# Patient Record
Sex: Female | Born: 1945
Health system: Southern US, Community
[De-identification: ages and names within clinical notes are randomized; demographics above are authoritative.]

## PROBLEM LIST (undated history)

## (undated) DIAGNOSIS — I350 Nonrheumatic aortic (valve) stenosis: Secondary | ICD-10-CM

## (undated) DIAGNOSIS — Z860100 Personal history of colon polyps, unspecified: Secondary | ICD-10-CM

## (undated) DIAGNOSIS — R011 Cardiac murmur, unspecified: Secondary | ICD-10-CM

## (undated) DIAGNOSIS — M1990 Unspecified osteoarthritis, unspecified site: Secondary | ICD-10-CM

## (undated) DIAGNOSIS — F419 Anxiety disorder, unspecified: Secondary | ICD-10-CM

## (undated) DIAGNOSIS — E7800 Pure hypercholesterolemia, unspecified: Secondary | ICD-10-CM

## (undated) DIAGNOSIS — E041 Nontoxic single thyroid nodule: Secondary | ICD-10-CM

## (undated) DIAGNOSIS — I10 Essential (primary) hypertension: Secondary | ICD-10-CM

## (undated) HISTORY — DX: Personal history of colon polyps, unspecified: Z86.0100

## (undated) HISTORY — PX: COLONOSCOPY: SHX174

## (undated) HISTORY — DX: Essential (primary) hypertension: I10

## (undated) HISTORY — DX: Personal history of colonic polyps: Z86.010

## (undated) HISTORY — PX: POLYPECTOMY: SHX149

---

## 1978-03-17 HISTORY — PX: PARTIAL HYSTERECTOMY: SHX80

## 2008-05-18 ENCOUNTER — Encounter (INDEPENDENT_AMBULATORY_CARE_PROVIDER_SITE_OTHER): Payer: Self-pay | Admitting: *Deleted

## 2008-09-25 ENCOUNTER — Ambulatory Visit: Payer: Self-pay | Admitting: Internal Medicine

## 2008-10-12 ENCOUNTER — Encounter: Payer: Self-pay | Admitting: Internal Medicine

## 2008-10-12 ENCOUNTER — Ambulatory Visit: Payer: Self-pay | Admitting: Internal Medicine

## 2008-10-14 ENCOUNTER — Encounter: Payer: Self-pay | Admitting: Internal Medicine

## 2013-11-03 ENCOUNTER — Encounter: Payer: Self-pay | Admitting: *Deleted

## 2013-11-08 ENCOUNTER — Encounter: Payer: Self-pay | Admitting: Internal Medicine

## 2013-11-18 ENCOUNTER — Encounter: Payer: Self-pay | Admitting: Internal Medicine

## 2014-01-05 ENCOUNTER — Ambulatory Visit (AMBULATORY_SURGERY_CENTER): Payer: Commercial Managed Care - HMO | Admitting: *Deleted

## 2014-01-05 VITALS — Ht 64.5 in | Wt 167.2 lb

## 2014-01-05 DIAGNOSIS — Z86010 Personal history of colonic polyps: Secondary | ICD-10-CM

## 2014-01-05 MED ORDER — MOVIPREP 100 G PO SOLR: 1.0000 | Freq: Once | ORAL | Status: DC

## 2014-01-05 NOTE — Progress Notes (Signed)
No egg or soy allergy. ewm No home 02 use. ewm No diet pills. ewm No issues with past sedation. ewm Pt declined emmi. ewm

## 2014-01-18 ENCOUNTER — Ambulatory Visit (AMBULATORY_SURGERY_CENTER): Payer: Commercial Managed Care - HMO | Admitting: Internal Medicine

## 2014-01-18 ENCOUNTER — Encounter: Payer: Self-pay | Admitting: Internal Medicine

## 2014-01-18 VITALS — BP 119/61 | HR 55 | Temp 98.0°F | Resp 42 | Ht 64.0 in | Wt 167.0 lb

## 2014-01-18 DIAGNOSIS — D122 Benign neoplasm of ascending colon: Secondary | ICD-10-CM

## 2014-01-18 DIAGNOSIS — Z86010 Personal history of colonic polyps: Secondary | ICD-10-CM

## 2014-01-18 MED ORDER — SODIUM CHLORIDE 0.9 % IV SOLN
500.0000 mL | INTRAVENOUS | Status: DC
Start: 2014-01-18 — End: 2014-01-18

## 2014-01-18 NOTE — Op Note (Signed)
[place]  [address] [place], [id number]   COLONOSCOPY PROCEDURE REPORT  PATIENT: Chicquita, Mendel  MR#: [id number] BIRTHDATE: Jan 26, 1946 , 68  yrs. old GENDER: female ENDOSCOPIST: Eustace Quail, MD REFERRED SL:[id number] Program Recall PROCEDURE DATE:  01/18/2014 PROCEDURE:   Colonoscopy with snare polypectomy x 2 First Screening Colonoscopy - Avg.  risk and is 50 yrs.  old or older - No.  Prior Negative Screening - Now for repeat screening. N/A  History of Adenoma - Now for follow-up colonoscopy & has been > or = to 3 yrs.  Yes hx of adenoma.  Has been 3 or more years since last colonoscopy.  Polyps Removed Today? Yes. ASA CLASS:   Class II INDICATIONS:surveillance colonoscopy based on a history of adenomatous colonic polyp(s). Index 2005 (-); 2010 (TA's x 3) MEDICATIONS: Monitored anesthesia care and Propofol 300 mg IV  DESCRIPTION OF PROCEDURE:   After the risks benefits and alternatives of the procedure were thoroughly explained, informed consent was obtained.  The digital rectal exam revealed no abnormalities of the rectum.   The LB [id number]  endoscope was introduced through the anus and advanced to the cecum, which was identified by both the appendix and ileocecal valve. No adverse events experienced.   The quality of the prep was excellent, using MoviPrep  The instrument was then slowly withdrawn as the colon was fully examined.    COLON FINDINGS: Two sessile polyps ranging from 3 to 3mm in size were found in the ascending colon.  A polypectomy was performed with a cold snare.  The resection was complete, the polyp tissue was completely retrieved and sent to histology.   The examination was otherwise normal.  Retroflexed views revealed internal hemorrhoids. The time to cecum=1 minutes 43 seconds.  Withdrawal time=10 minutes 53 seconds.  The scope was withdrawn and the procedure completed. COMPLICATIONS: There were no immediate  complications.  ENDOSCOPIC IMPRESSION: 1.   Two sessile polyps were found in the ascending colon; polypectomy was performed with a cold snare 2.   The examination was otherwise normal  RECOMMENDATIONS: 1. Follow up colonoscopy in 5 years  eSigned:  Eustace Quail, MD 01/18/2014 11:49 AM   cc: [name], MD and The Patient

## 2014-01-18 NOTE — Progress Notes (Signed)
Called to room to assist during endoscopic procedure.  Patient ID and intended procedure confirmed with present staff. Received instructions for my participation in the procedure from the performing physician.  

## 2014-01-18 NOTE — Progress Notes (Signed)
Patient awakening,vss,report to rn 

## 2014-01-18 NOTE — Patient Instructions (Signed)
YOU HAD AN ENDOSCOPIC PROCEDURE TODAY AT THE [facility]: Refer to the procedure report that was given to you for any specific questions about what was found during the examination.  If the procedure report does not answer your questions, please call your gastroenterologist to clarify.  If you requested that your care partner not be given the details of your procedure findings, then the procedure report has been included in a sealed envelope for you to review at your convenience later.  YOU SHOULD EXPECT: Some feelings of bloating in the abdomen. Passage of more gas than usual.  Walking can help get rid of the air that was put into your GI tract during the procedure and reduce the bloating. If you had a lower endoscopy (such as a colonoscopy or flexible sigmoidoscopy) you may notice spotting of blood in your stool or on the toilet paper. If you underwent a bowel prep for your procedure, then you may not have a normal bowel movement for a few days.  DIET: Your first meal following the procedure should be a light meal and then it is ok to progress to your normal diet.  A half-sandwich or bowl of soup is an example of a good first meal.  Heavy or fried foods are harder to digest and may make you feel nauseous or bloated.  Likewise meals heavy in dairy and vegetables can cause extra gas to form and this can also increase the bloating.  Drink plenty of fluids but you should avoid alcoholic beverages for 24 hours.  ACTIVITY: Your care partner should take you home directly after the procedure.  You should plan to take it easy, moving slowly for the rest of the day.  You can resume normal activity the day after the procedure however you should NOT DRIVE or use heavy machinery for 24 hours (because of the sedation medicines used during the test).    SYMPTOMS TO REPORT IMMEDIATELY: A gastroenterologist can be reached at any hour.  During normal business hours, 8:30 AM to 5:00 PM Monday through Friday,  call [phone].  After hours and on weekends, please call the GI answering service at [phone] who will take a message and have the physician on call contact you.   Following lower endoscopy (colonoscopy or flexible sigmoidoscopy):  Excessive amounts of blood in the stool  Significant tenderness or worsening of abdominal pains  Swelling of the abdomen that is new, acute  Fever of 100F or higher  FOLLOW UP: If any biopsies were taken you will be contacted by phone or by letter within the next 1-3 weeks.  Call your gastroenterologist if you have not heard about the biopsies in 3 weeks.  Our staff will call the home number listed on your records the next business day following your procedure to check on you and address any questions or concerns that you may have at that time regarding the information given to you following your procedure. This is a courtesy call and so if there is no answer at the home number and we have not heard from you through the emergency physician on call, we will assume that you have returned to your regular daily activities without incident.  SIGNATURES/CONFIDENTIALITY: You and/or your care partner have signed paperwork which will be entered into your electronic medical record.  These signatures attest to the fact that that the information above on your After Visit Summary has been reviewed and is understood.  Full responsibility of the confidentiality of this   discharge information lies with you and/or your care-partner.    Resume medications. Information given on polyps with discharge instructions. 

## 2014-01-19 ENCOUNTER — Telehealth: Payer: Self-pay | Admitting: *Deleted

## 2014-01-19 NOTE — Telephone Encounter (Signed)
  Follow up Call-  Call back number 01/18/2014  Post procedure Call Back phone  # [phone]  Permission to leave phone message Yes     Patient questions:  Do you have a fever, pain , or abdominal swelling? No. Pain Score  0 *  Have you tolerated food without any problems? Yes.    Have you been able to return to your normal activities? Yes.    Do you have any questions about your discharge instructions: Diet   No. Medications  No. Follow up visit  No.  Do you have questions or concerns about your Care? No.  Actions: * If pain score is 4 or above: No action needed, pain <4.

## 2014-01-24 ENCOUNTER — Encounter: Payer: Self-pay | Admitting: Internal Medicine

## 2014-05-12 DIAGNOSIS — Z79899 Other long term (current) drug therapy: Secondary | ICD-10-CM | POA: Diagnosis not present

## 2014-05-12 DIAGNOSIS — R8290 Unspecified abnormal findings in urine: Secondary | ICD-10-CM | POA: Diagnosis not present

## 2014-05-12 DIAGNOSIS — M859 Disorder of bone density and structure, unspecified: Secondary | ICD-10-CM | POA: Diagnosis not present

## 2014-05-12 DIAGNOSIS — R8299 Other abnormal findings in urine: Secondary | ICD-10-CM | POA: Diagnosis not present

## 2014-05-12 DIAGNOSIS — I10 Essential (primary) hypertension: Secondary | ICD-10-CM | POA: Diagnosis not present

## 2014-05-19 DIAGNOSIS — Z1389 Encounter for screening for other disorder: Secondary | ICD-10-CM | POA: Diagnosis not present

## 2014-05-19 DIAGNOSIS — Z0000 Encounter for general adult medical examination without abnormal findings: Secondary | ICD-10-CM | POA: Diagnosis not present

## 2014-05-19 DIAGNOSIS — K219 Gastro-esophageal reflux disease without esophagitis: Secondary | ICD-10-CM | POA: Diagnosis not present

## 2014-05-19 DIAGNOSIS — I10 Essential (primary) hypertension: Secondary | ICD-10-CM | POA: Diagnosis not present

## 2014-05-19 DIAGNOSIS — M859 Disorder of bone density and structure, unspecified: Secondary | ICD-10-CM | POA: Diagnosis not present

## 2014-05-19 DIAGNOSIS — Z6827 Body mass index (BMI) 27.0-27.9, adult: Secondary | ICD-10-CM | POA: Diagnosis not present

## 2014-05-25 DIAGNOSIS — Z1212 Encounter for screening for malignant neoplasm of rectum: Secondary | ICD-10-CM | POA: Diagnosis not present

## 2014-06-01 DIAGNOSIS — Z1231 Encounter for screening mammogram for malignant neoplasm of breast: Secondary | ICD-10-CM | POA: Diagnosis not present

## 2014-06-26 DIAGNOSIS — H5210 Myopia, unspecified eye: Secondary | ICD-10-CM | POA: Diagnosis not present

## 2014-06-26 DIAGNOSIS — H5203 Hypermetropia, bilateral: Secondary | ICD-10-CM | POA: Diagnosis not present

## 2014-10-16 DIAGNOSIS — L988 Other specified disorders of the skin and subcutaneous tissue: Secondary | ICD-10-CM | POA: Diagnosis not present

## 2014-10-16 DIAGNOSIS — Z6827 Body mass index (BMI) 27.0-27.9, adult: Secondary | ICD-10-CM | POA: Diagnosis not present

## 2014-11-15 DIAGNOSIS — I10 Essential (primary) hypertension: Secondary | ICD-10-CM | POA: Diagnosis not present

## 2014-11-15 DIAGNOSIS — M859 Disorder of bone density and structure, unspecified: Secondary | ICD-10-CM | POA: Diagnosis not present

## 2014-11-15 DIAGNOSIS — Z6828 Body mass index (BMI) 28.0-28.9, adult: Secondary | ICD-10-CM | POA: Diagnosis not present

## 2014-11-15 DIAGNOSIS — F419 Anxiety disorder, unspecified: Secondary | ICD-10-CM | POA: Diagnosis not present

## 2014-11-15 DIAGNOSIS — K219 Gastro-esophageal reflux disease without esophagitis: Secondary | ICD-10-CM | POA: Diagnosis not present

## 2014-12-08 DIAGNOSIS — R229 Localized swelling, mass and lump, unspecified: Secondary | ICD-10-CM | POA: Diagnosis not present

## 2014-12-08 DIAGNOSIS — Z889 Allergy status to unspecified drugs, medicaments and biological substances status: Secondary | ICD-10-CM | POA: Diagnosis not present

## 2014-12-08 DIAGNOSIS — Z6827 Body mass index (BMI) 27.0-27.9, adult: Secondary | ICD-10-CM | POA: Diagnosis not present

## 2015-01-04 DIAGNOSIS — J310 Chronic rhinitis: Secondary | ICD-10-CM | POA: Diagnosis not present

## 2015-01-04 DIAGNOSIS — H1045 Other chronic allergic conjunctivitis: Secondary | ICD-10-CM | POA: Diagnosis not present

## 2015-01-04 DIAGNOSIS — K219 Gastro-esophageal reflux disease without esophagitis: Secondary | ICD-10-CM | POA: Diagnosis not present

## 2015-01-04 DIAGNOSIS — R220 Localized swelling, mass and lump, head: Secondary | ICD-10-CM | POA: Diagnosis not present

## 2015-04-23 DIAGNOSIS — H1045 Other chronic allergic conjunctivitis: Secondary | ICD-10-CM | POA: Diagnosis not present

## 2015-04-23 DIAGNOSIS — J310 Chronic rhinitis: Secondary | ICD-10-CM | POA: Diagnosis not present

## 2015-04-23 DIAGNOSIS — R220 Localized swelling, mass and lump, head: Secondary | ICD-10-CM | POA: Diagnosis not present

## 2015-04-23 DIAGNOSIS — K219 Gastro-esophageal reflux disease without esophagitis: Secondary | ICD-10-CM | POA: Diagnosis not present

## 2015-05-21 DIAGNOSIS — N390 Urinary tract infection, site not specified: Secondary | ICD-10-CM | POA: Diagnosis not present

## 2015-05-21 DIAGNOSIS — I10 Essential (primary) hypertension: Secondary | ICD-10-CM | POA: Diagnosis not present

## 2015-05-21 DIAGNOSIS — M859 Disorder of bone density and structure, unspecified: Secondary | ICD-10-CM | POA: Diagnosis not present

## 2015-05-21 DIAGNOSIS — R8299 Other abnormal findings in urine: Secondary | ICD-10-CM | POA: Diagnosis not present

## 2015-05-28 DIAGNOSIS — Z1389 Encounter for screening for other disorder: Secondary | ICD-10-CM | POA: Diagnosis not present

## 2015-05-28 DIAGNOSIS — K219 Gastro-esophageal reflux disease without esophagitis: Secondary | ICD-10-CM | POA: Diagnosis not present

## 2015-05-28 DIAGNOSIS — Z6827 Body mass index (BMI) 27.0-27.9, adult: Secondary | ICD-10-CM | POA: Diagnosis not present

## 2015-05-28 DIAGNOSIS — Z0000 Encounter for general adult medical examination without abnormal findings: Secondary | ICD-10-CM | POA: Diagnosis not present

## 2015-05-28 DIAGNOSIS — Z124 Encounter for screening for malignant neoplasm of cervix: Secondary | ICD-10-CM | POA: Diagnosis not present

## 2015-05-28 DIAGNOSIS — J302 Other seasonal allergic rhinitis: Secondary | ICD-10-CM | POA: Diagnosis not present

## 2015-05-28 DIAGNOSIS — I10 Essential (primary) hypertension: Secondary | ICD-10-CM | POA: Diagnosis not present

## 2015-05-28 DIAGNOSIS — F419 Anxiety disorder, unspecified: Secondary | ICD-10-CM | POA: Diagnosis not present

## 2015-05-28 DIAGNOSIS — M859 Disorder of bone density and structure, unspecified: Secondary | ICD-10-CM | POA: Diagnosis not present

## 2015-05-28 DIAGNOSIS — E663 Overweight: Secondary | ICD-10-CM | POA: Diagnosis not present

## 2015-06-04 DIAGNOSIS — Z1231 Encounter for screening mammogram for malignant neoplasm of breast: Secondary | ICD-10-CM | POA: Diagnosis not present

## 2015-06-08 DIAGNOSIS — Z1212 Encounter for screening for malignant neoplasm of rectum: Secondary | ICD-10-CM | POA: Diagnosis not present

## 2015-06-22 ENCOUNTER — Encounter: Payer: Self-pay | Admitting: Internal Medicine

## 2015-08-26 DIAGNOSIS — H6123 Impacted cerumen, bilateral: Secondary | ICD-10-CM | POA: Diagnosis not present

## 2015-09-14 DIAGNOSIS — H5210 Myopia, unspecified eye: Secondary | ICD-10-CM | POA: Diagnosis not present

## 2015-09-14 DIAGNOSIS — H524 Presbyopia: Secondary | ICD-10-CM | POA: Diagnosis not present

## 2015-12-12 DIAGNOSIS — J302 Other seasonal allergic rhinitis: Secondary | ICD-10-CM | POA: Diagnosis not present

## 2015-12-12 DIAGNOSIS — K219 Gastro-esophageal reflux disease without esophagitis: Secondary | ICD-10-CM | POA: Diagnosis not present

## 2015-12-12 DIAGNOSIS — E663 Overweight: Secondary | ICD-10-CM | POA: Diagnosis not present

## 2015-12-12 DIAGNOSIS — F419 Anxiety disorder, unspecified: Secondary | ICD-10-CM | POA: Diagnosis not present

## 2015-12-12 DIAGNOSIS — I10 Essential (primary) hypertension: Secondary | ICD-10-CM | POA: Diagnosis not present

## 2015-12-12 DIAGNOSIS — M859 Disorder of bone density and structure, unspecified: Secondary | ICD-10-CM | POA: Diagnosis not present

## 2015-12-12 DIAGNOSIS — Z6827 Body mass index (BMI) 27.0-27.9, adult: Secondary | ICD-10-CM | POA: Diagnosis not present

## 2016-12-01 ENCOUNTER — Other Ambulatory Visit: Payer: Self-pay | Admitting: Internal Medicine

## 2016-12-01 DIAGNOSIS — I10 Essential (primary) hypertension: Secondary | ICD-10-CM

## 2016-12-01 DIAGNOSIS — R011 Cardiac murmur, unspecified: Secondary | ICD-10-CM

## 2016-12-02 ENCOUNTER — Ambulatory Visit (HOSPITAL_COMMUNITY): Payer: Medicare PPO | Attending: Cardiology

## 2016-12-02 ENCOUNTER — Other Ambulatory Visit: Payer: Self-pay

## 2016-12-02 ENCOUNTER — Encounter (INDEPENDENT_AMBULATORY_CARE_PROVIDER_SITE_OTHER): Payer: Self-pay

## 2016-12-02 DIAGNOSIS — I340 Nonrheumatic mitral (valve) insufficiency: Secondary | ICD-10-CM | POA: Diagnosis not present

## 2016-12-02 DIAGNOSIS — R011 Cardiac murmur, unspecified: Secondary | ICD-10-CM | POA: Insufficient documentation

## 2016-12-02 DIAGNOSIS — E785 Hyperlipidemia, unspecified: Secondary | ICD-10-CM | POA: Diagnosis not present

## 2016-12-02 DIAGNOSIS — I10 Essential (primary) hypertension: Secondary | ICD-10-CM | POA: Insufficient documentation

## 2016-12-04 ENCOUNTER — Telehealth: Payer: Self-pay | Admitting: Cardiovascular Disease

## 2016-12-04 NOTE — Telephone Encounter (Signed)
Received incoming records from [facility] for upcoming appointment on 12/30/16 @ 2:40pm with Dr. [name]. Records given to [facility] in Medical Records. 12/04/16 ab

## 2016-12-30 ENCOUNTER — Encounter: Payer: Self-pay | Admitting: Cardiovascular Disease

## 2016-12-30 ENCOUNTER — Ambulatory Visit (INDEPENDENT_AMBULATORY_CARE_PROVIDER_SITE_OTHER): Payer: Medicare PPO | Admitting: Cardiovascular Disease

## 2016-12-30 VITALS — BP 158/98 | HR 68 | Ht 64.5 in | Wt 165.4 lb

## 2016-12-30 DIAGNOSIS — E785 Hyperlipidemia, unspecified: Secondary | ICD-10-CM

## 2016-12-30 DIAGNOSIS — I519 Heart disease, unspecified: Secondary | ICD-10-CM

## 2016-12-30 DIAGNOSIS — I350 Nonrheumatic aortic (valve) stenosis: Secondary | ICD-10-CM | POA: Diagnosis not present

## 2016-12-30 DIAGNOSIS — I77810 Thoracic aortic ectasia: Secondary | ICD-10-CM | POA: Diagnosis not present

## 2016-12-30 DIAGNOSIS — I10 Essential (primary) hypertension: Secondary | ICD-10-CM | POA: Diagnosis not present

## 2016-12-30 DIAGNOSIS — I5189 Other ill-defined heart diseases: Secondary | ICD-10-CM

## 2016-12-30 MED ORDER — LOSARTAN POTASSIUM 25 MG PO TABS: 25.0000 mg | ORAL_TABLET | Freq: Every day | ORAL | 3 refills | Status: DC

## 2016-12-30 NOTE — Patient Instructions (Signed)
Medication Instructions:  START losartan (Cozaar) 25mg  daily  Testing/Procedures: Your physician has requested that you have an echocardiogram in February 2019. Echocardiography is a painless test that uses sound waves to create images of your heart. It provides your doctor with information about the size and shape of your heart and how well your heart's chambers and valves are working. This procedure takes approximately one hour. There are no restrictions for this procedure. This will be done at our [facility] location:  [place]: Your physician wants you to follow-up in: February (after echo) with Dr. [name].  You will receive a reminder letter in the mail two months in advance. If you don't receive a letter, please call our office to schedule the follow-up appointment.   Any Other Special Instructions Will Be Listed Below (If Applicable).     If you need a refill on your cardiac medications before your next appointment, please call your pharmacy.

## 2016-12-30 NOTE — Progress Notes (Signed)
Cardiology Office Note    Date:  01/06/2017   ID:  Joann, Barry 04/06/1945, MRN [id number]  PCP:  [name], MD  Cardiologist:  [name], MD   Chief Complaint  Patient presents with  . New Patient (Initial Visit)    Aortic Stenosis   New patient evaluation, referred through the courtesy of Dr. [name] for evaluation of aortic stenosis.  History of Present Illness:  Joann Barry is a 71 y.o. female who presents for cardiology evaluation per referral of Dr. [name] for evaluation of aortic stenosis.  [name] admits to a 30 year history of hypertension and has been on atenolol 50 mg daily.  She also has a history of osteopenia, anxiety, and GERD.  She had recently seen [name] in because of a systolic cardiac murmur.  She was referred for echo Doppler study which was done on 12/02/2016.  This showed an EF of 55-60%.  She had normal wall motion with grade 1 diastolic dysfunction.  Aortic valve was moderately calcified and functionally bicuspid.  Valve motion was restricted.  She was felt to have moderate aortic stenosis with trivial AR.  Her mean gradient was 28, and peak instantaneous gradient 45 mm.  Her ascending aorta was mildly dilated at 4.5 cm..  There was mild MR, and there was mild left atrial dilatation.  [name] denies any episodes of chest pain or shortness of breath.  She denies presyncope or syncope.  At times she does note some anxiety and when she is anxious.  She does sense that she is breathing more rapidly.  She denies any exertional symptoms.  She is now referred for cardiology evaluation.   Past Medical History:  Diagnosis Date  . Hypertension   . Personal history of colonic polyps     Past Surgical History:  Procedure Laterality Date  . COLONOSCOPY    . PARTIAL HYSTERECTOMY  1980  . POLYPECTOMY      Current Medications: Outpatient Medications Prior to Visit  Medication Sig Dispense Refill  . atenolol (TENORMIN)  50 MG tablet Take 50 mg by mouth daily.    . calcium carbonate (OS-CAL) 600 MG TABS tablet Take 600 mg by mouth daily with breakfast.    . Calcium-Vitamin D-Vitamin K (CALCIUM + D) [phone] MG-UNT-MCG CHEW Chew 1 capsule by mouth daily.    . Multiple Vitamins-Minerals (MULTIVITAMIN PO) Take 1 capsule by mouth daily.     No facility-administered medications prior to visit.      Allergies:   Sulfonamide derivatives   Social History   Social History  . Marital status: Married    Spouse name: N/A  . Number of children: N/A  . Years of education: N/A   Social History Main Topics  . Smoking status: Never Smoker  . Smokeless tobacco: Never Used  . Alcohol use No  . Drug use: No  . Sexual activity: Not Asked   Other Topics Concern  . None   Social History Narrative  . None    Additional social history is notable in that she is married for 50 years.  She has 5 children and 16 great and grandchildren.  She works for [employer].  She completed 12th grade of education.  There is no tobacco history or alcohol use.  Family History:  The patient's family history includes Pancreatic cancer in her mother.  Her mother died at 81 from pancreatic cancer.  Her father died at 28.  One brother had  neurofibromatosis.  ROS General: Negative; No fevers, chills, or night sweats;  HEENT: Negative; No changes in vision or hearing, sinus congestion, difficulty swallowing Pulmonary: Negative; No cough, wheezing, shortness of breath, hemoptysis Cardiovascular: Negative; No chest pain, presyncope, syncope, palpitations GI: Negative; No nausea, vomiting, diarrhea, or abdominal pain GU: Negative; No dysuria, hematuria, or difficulty voiding Musculoskeletal: Positive for osteopenia Hematologic/Oncology: Negative; no easy bruising, bleeding Endocrine: Negative; no heat/cold intolerance; no diabetes Neuro: Negative; no changes in balance, headaches Skin: Negative; No rashes or skin lesions Psychiatric:  Negative; No behavioral problems, depression Sleep: Negative; No snoring, daytime sleepiness, hypersomnolence, bruxism, restless legs, hypnogognic hallucinations, no cataplexy Other comprehensive 14 point system review is negative.   PHYSICAL EXAM:   VS:  BP (!) 158/98   Pulse 68   Ht 5' 4.5" (1.638 m)   Wt 165 lb 6.4 oz (75 kg)   BMI 27.95 kg/m     Repeat blood pressure by me was 170/90  Wt Readings from Last 3 Encounters:  12/30/16 165 lb 6.4 oz (75 kg)  01/18/14 167 lb (75.8 kg)  01/05/14 167 lb 3.2 oz (75.8 kg)    General: Alert, oriented, no distress.  Skin: normal turgor, no rashes, warm and dry HEENT: Normocephalic, atraumatic. Pupils equal round and reactive to light; sclera anicteric; extraocular muscles intact; Fundi mild vessel tortuosity.  No hemorrhages or exudates.  Disks flat. Nose without nasal septal hypertrophy Mouth/Parynx benign; Mallinpatti scale 2 Neck: No JVD, no carotid bruits; normal carotid upstroke Lungs: clear to ausculatation and percussion; no wheezing or rales Chest wall: without tenderness to palpitation Heart: PMI not displaced, RRR, s1 s2 normal, 2/6 mid peaking systolic murmur in the aortic area, no diastolic murmur, no rubs, gallops, thrills, or heaves Abdomen: soft, nontender; no hepatosplenomehaly, BS+; abdominal aorta nontender and not dilated by palpation. Back: no CVA tenderness Pulses 2+ Musculoskeletal: full range of motion, normal strength, no joint deformities Extremities: no clubbing cyanosis or edema, Homan's sign negative  Neurologic: grossly nonfocal; Cranial nerves grossly wnl Psychologic: Normal mood and affect   Studies/Labs Reviewed:   EKG:  EKG is ordered today.  ECG (independently read by me): Normal sinus rhythm at 68 bpm.  Normal intervals.  No ST segment changes.  No ectopy.  Recent Labs: No flowsheet data found.   No flowsheet data found.  No flowsheet data found. No results found for: MCV No results found  for: TSH No results found for: HGBA1C   BNP No results found for: BNP  ProBNP No results found for: PROBNP   Lipid Panel  No results found for: CHOL, TRIG, HDL, CHOLHDL, VLDL, LDLCALC, LDLDIRECT   RADIOLOGY: No results found.   Additional studies/ records that were reviewed today include:  I reviewed the medical records from [place] including the echo Doppler study.  Office visits, ECG, chest x-ray, and laboratory.  Glucose 108, BUN 13, Cr 0.8. Cholesterol 187, triglycerides 105, HDL 44, LDL 122.  Non-HDL 43. TSH 0.21 Vitamin D 27.6   ASSESSMENT:    1. Aortic valve stenosis, etiology of cardiac valve disease unspecified   2. Essential hypertension   3. Dilated aortic root ([place])   4. Grade I diastolic dysfunction   5. Hyperlipidemia, unspecified hyperlipidemia type      PLAN:  Ms. Tehilla Coffel is a very pleasant 71 year old female who is referred by [name] for evaluation of cardiac murmur.  Her cardiac murmur is concordant with aortic valve stenosis and echocardiography has demonstrated normal systolic function with grade  1 diastolic dysfunction.  She has moderate aortic stenosis with a mean gradient of 28, peak instantaneous gradient to 45 mm Hg. in addition, there is poststenotic aortic root dilatation at 4.5 cm.  At present, she is asymptomatic with reference to chest pain, PND., orthopnea, presyncope or syncope, or symptoms of CHF.  I have recommended close surveillance and have scheduled a six-month follow-up echo Doppler study to be done in February 2019.  She is hypertensive today and with her grade 1 diastolic dysfunction, I elected to add very low-dose losartan at 25 mg daily to her present dose of atenolol 50 mg.  Her ECG shows sinus rhythm without arrhythmia and with normal intervals.  I reviewed her laboratory in her LDL cholesterol was increased at 122.  In addition, TSH, appeared mildly over suppressed and she is not on therapy.  These will need to be  followed by [name].  She will monitor her blood pressure. On her follow-up echo we will reassess her aortic root dimensions, and if this has progressed I will recommend subsequent CTA or MRA imaging.   Medication Adjustments/Labs and Tests Ordered: Current medicines are reviewed at length with the patient today.  Concerns regarding medicines are outlined above.  Medication changes, Labs and Tests ordered today are listed in the Patient Instructions below. Patient Instructions  Medication Instructions:  START losartan (Cozaar) 25mg  daily  Testing/Procedures: Your physician has requested that you have an echocardiogram in February 2019. Echocardiography is a painless test that uses sound waves to create images of your heart. It provides your doctor with information about the size and shape of your heart and how well your heart's chambers and valves are working. This procedure takes approximately one hour. There are no restrictions for this procedure. This will be done at our [facility] location:  [place]: Your physician wants you to follow-up in: February (after echo) with Dr. [name].  You will receive a reminder letter in the mail two months in advance. If you don't receive a letter, please call our office to schedule the follow-up appointment.   Any Other Special Instructions Will Be Listed Below (If Applicable).     If you need a refill on your cardiac medications before your next appointment, please call your pharmacy.      Signed, [name], MD  01/06/2017 6:41 PM    [facility] [address], [place], [place], [place]  [address] Phone: [phone]

## 2017-04-23 DIAGNOSIS — Z6827 Body mass index (BMI) 27.0-27.9, adult: Secondary | ICD-10-CM | POA: Diagnosis not present

## 2017-04-23 DIAGNOSIS — F419 Anxiety disorder, unspecified: Secondary | ICD-10-CM | POA: Diagnosis not present

## 2017-05-01 ENCOUNTER — Ambulatory Visit (HOSPITAL_COMMUNITY): Payer: Medicare HMO | Attending: Cardiovascular Disease

## 2017-05-01 ENCOUNTER — Other Ambulatory Visit: Payer: Self-pay

## 2017-05-01 ENCOUNTER — Encounter (INDEPENDENT_AMBULATORY_CARE_PROVIDER_SITE_OTHER): Payer: Self-pay

## 2017-05-01 DIAGNOSIS — I77810 Thoracic aortic ectasia: Secondary | ICD-10-CM | POA: Diagnosis not present

## 2017-05-01 DIAGNOSIS — Q231 Congenital insufficiency of aortic valve: Secondary | ICD-10-CM | POA: Insufficient documentation

## 2017-05-01 DIAGNOSIS — I119 Hypertensive heart disease without heart failure: Secondary | ICD-10-CM | POA: Insufficient documentation

## 2017-05-01 DIAGNOSIS — E785 Hyperlipidemia, unspecified: Secondary | ICD-10-CM | POA: Insufficient documentation

## 2017-05-01 DIAGNOSIS — I340 Nonrheumatic mitral (valve) insufficiency: Secondary | ICD-10-CM | POA: Insufficient documentation

## 2017-05-01 DIAGNOSIS — I350 Nonrheumatic aortic (valve) stenosis: Secondary | ICD-10-CM | POA: Diagnosis not present

## 2017-05-08 ENCOUNTER — Telehealth: Payer: Self-pay | Admitting: Cardiovascular Disease

## 2017-05-08 NOTE — Telephone Encounter (Signed)
Patient called w/results. Per Angie Fava, RN - ok to move appt to 05/12/17 @ 10:20am for sooner follow up given results. Patient agrees to this appt time.

## 2017-05-08 NOTE — Telephone Encounter (Signed)
New message   Patient returning call for echo results.  Please call

## 2017-05-12 ENCOUNTER — Encounter: Payer: Self-pay | Admitting: Cardiovascular Disease

## 2017-05-12 ENCOUNTER — Ambulatory Visit: Payer: Medicare HMO | Admitting: Cardiovascular Disease

## 2017-05-12 VITALS — BP 140/78 | HR 58 | Ht 64.5 in | Wt 159.0 lb

## 2017-05-12 DIAGNOSIS — E785 Hyperlipidemia, unspecified: Secondary | ICD-10-CM | POA: Diagnosis not present

## 2017-05-12 DIAGNOSIS — I350 Nonrheumatic aortic (valve) stenosis: Secondary | ICD-10-CM

## 2017-05-12 DIAGNOSIS — I10 Essential (primary) hypertension: Secondary | ICD-10-CM

## 2017-05-12 MED ORDER — LOSARTAN POTASSIUM 25 MG PO TABS: 25.0000 mg | ORAL_TABLET | Freq: Two times a day (BID) | ORAL | 3 refills | Status: DC

## 2017-05-12 NOTE — Progress Notes (Signed)
Cardiology Office Note    Date:  05/14/2017   ID:  Joann, Barry Nov 06, 1945, MRN [id number]  PCP:  [name], MD  Cardiologist:  [name], MD   Chief Complaint  Patient presents with  . Follow-up   F/U evaluation, referred through the courtesy of Dr. [name] for evaluation of aortic stenosis.  History of Present Illness:  Joann Barry is a 72 y.o. female who I had seen in October 2016 when she was referred by [name] for evaluation of aortic stenosis.  She presents for follow-up evaluation.  Joann Barry admits to a 30 year history of hypertension and has been on atenolol 50 mg daily.  She also has a history of osteopenia, anxiety, and GERD.  She had recently seen [name] in because of a systolic cardiac murmur.  She was referred for echo Doppler study which was done on 12/02/2016.  This showed an EF of 55-60%.  She had normal wall motion with grade 1 diastolic dysfunction.  Aortic valve was moderately calcified and functionally bicuspid.  Valve motion was restricted.  She was felt to have moderate aortic stenosis with trivial AR.  Her mean gradient was 28, and peak instantaneous gradient 45 mm.  Her ascending aorta was mildly dilated at 4.5 cm..  There was mild MR, and there was mild left atrial dilatation.  When I initially saw her, she denied any episodes of chest pain or shortness of breath. .  She denied any presyncope or syncope and during periods of anxiety had noticed some mild increased respirations.  When I saw her, since she was asymptomatic.  I recommended close surveillance.  She was hypertensive and had grade 1 diastolic dysfunction and added low-dose losartan 25 mg daily to take with her previously atenolol regimen.  Since her initial evaluation, she has continued to be entirely asymptomatic.  There is only rare shortness of breath when she gets upset.  There is no change in exertional capacity.  She walks.  She denies palpitations.  She  underwent a follow-up echo Doppler study on 05/01/2017.  This has shown some progression of her aortic stenosis such that her mean gradient had increased to 37 mm with a peak instantaneous gradient of 62 mm.  Calculated valve area was 0.8 cm.  There was grade 2 diastolic dysfunction.  She presents for reevaluation.  Past Medical History:  Diagnosis Date  . Hypertension   . Personal history of colonic polyps     Past Surgical History:  Procedure Laterality Date  . COLONOSCOPY    . PARTIAL HYSTERECTOMY  1980  . POLYPECTOMY      Current Medications: Outpatient Medications Prior to Visit  Medication Sig Dispense Refill  . ALPRAZolam (XANAX) 0.25 MG tablet Take 0.25 mg by mouth as needed.    Marland Kitchen atenolol (TENORMIN) 50 MG tablet Take 50 mg by mouth daily.    . calcium carbonate (OS-CAL) 600 MG TABS tablet Take 600 mg by mouth daily with breakfast.    . Calcium-Vitamin D-Vitamin K (CALCIUM + D) [phone] MG-UNT-MCG CHEW Chew 1 capsule by mouth daily.    Marland Kitchen escitalopram (LEXAPRO) 10 MG tablet     . Multiple Vitamins-Minerals (MULTIVITAMIN PO) Take 1 capsule by mouth daily.    Marland Kitchen losartan (COZAAR) 25 MG tablet Take 1 tablet (25 mg total) by mouth daily. 90 tablet 3   No facility-administered medications prior to visit.      Allergies:   Sulfonamide derivatives   Social History  Socioeconomic History  . Marital status: Married    Spouse name: None  . Number of children: None  . Years of education: None  . Highest education level: None  Social Needs  . Financial resource strain: None  . Food insecurity - worry: None  . Food insecurity - inability: None  . Transportation needs - medical: None  . Transportation needs - non-medical: None  Occupational History  . None  Tobacco Use  . Smoking status: Never Smoker  . Smokeless tobacco: Never Used  Substance and Sexual Activity  . Alcohol use: No  . Drug use: No  . Sexual activity: None  Other Topics Concern  . None  Social  History Narrative  . None    Additional social history is notable in that she is married for 50 years.  She has 5 children and 16 great and grandchildren.  She works for [employer].  She completed 12th grade of education.  There is no tobacco history or alcohol use.  Family History:  The patient's family history includes Pancreatic cancer in her mother.  Her mother died at 60 from pancreatic cancer.  Her father died at 1.  One brother had neurofibromatosis.  ROS General: Negative; No fevers, chills, or night sweats;  HEENT: Negative; No changes in vision or hearing, sinus congestion, difficulty swallowing Pulmonary: Negative; No cough, wheezing, shortness of breath, hemoptysis Cardiovascular: Negative; No chest pain, presyncope, syncope, palpitations GI: Negative; No nausea, vomiting, diarrhea, or abdominal pain GU: Negative; No dysuria, hematuria, or difficulty voiding Musculoskeletal: Positive for osteopenia Hematologic/Oncology: Negative; no easy bruising, bleeding Endocrine: Negative; no heat/cold intolerance; no diabetes Neuro: Negative; no changes in balance, headaches Skin: Negative; No rashes or skin lesions Psychiatric: Negative; No behavioral problems, depression Sleep: Negative; No snoring, daytime sleepiness, hypersomnolence, bruxism, restless legs, hypnogognic hallucinations, no cataplexy Other comprehensive 14 point system review is negative.   PHYSICAL EXAM:   VS:  BP 140/78   Pulse (!) 58   Ht 5' 4.5" (1.638 m)   Wt 159 lb (72.1 kg)   BMI 26.87 kg/m     Repeat blood pressure was 142/76 supine, 140/78 standing.  Wt Readings from Last 3 Encounters:  05/12/17 159 lb (72.1 kg)  12/30/16 165 lb 6.4 oz (75 kg)  01/18/14 167 lb (75.8 kg)    General: Alert, oriented, no distress.  Skin: normal turgor, no rashes, warm and dry HEENT: Normocephalic, atraumatic. Pupils equal round and reactive to light; sclera anicteric; extraocular muscles intact;  Nose without nasal  septal hypertrophy Mouth/Parynx benign; Mallinpatti scale 2 Neck: No JVD, no carotid bruits; normal carotid upstroke Lungs: clear to ausculatation and percussion; no wheezing or rales Chest wall: without tenderness to palpitation Heart: PMI not displaced, RRR, s1 s2 normal, 2/6 mid peaking systolic murmur in the aortic area, no diastolic murmur, no rubs, gallops, thrills, or heaves Abdomen: soft, nontender; no hepatosplenomehaly, BS+; abdominal aorta nontender and not dilated by palpation. Back: no CVA tenderness Pulses 2+ Musculoskeletal: full range of motion, normal strength, no joint deformities Extremities: no clubbing cyanosis or edema, Homan's sign negative  Neurologic: grossly nonfocal; Cranial nerves grossly wnl Psychologic: Normal mood and affect   Studies/Labs Reviewed:   EKG:  EKG is ordered today.  ECG (independently read by me): Sinus bradycardia 58 bpm.  PR interval 172 ms.  QTc interval 426 ms.  No ectopy.  October 2018 ECG (independently read by me): Normal sinus rhythm at 68 bpm.  Normal intervals.  No ST segment changes.  No ectopy.  Recent Labs: No flowsheet data found.   No flowsheet data found.  No flowsheet data found. No results found for: MCV No results found for: TSH No results found for: HGBA1C   BNP No results found for: BNP  ProBNP No results found for: PROBNP   Lipid Panel  No results found for: CHOL, TRIG, HDL, CHOLHDL, VLDL, LDLCALC, LDLDIRECT   RADIOLOGY: No results found.   Additional studies/ records that were reviewed today include:  I reviewed the medical records from [facility] including the echo Doppler study.  Office visits, ECG, chest x-ray, and laboratory.  Glucose 108, BUN 13, Cr 0.8. Cholesterol 187, triglycerides 105, HDL 44, LDL 122.  Non-HDL 43. TSH 0.21 Vitamin D 27.6   ASSESSMENT:    1. Aortic valve stenosis, etiology of cardiac valve disease unspecified   2. Essential hypertension   3. Hyperlipidemia,  unspecified hyperlipidemia type      PLAN:  Ms. Joann Barry is a very pleasant 72 year old female who was referred by [name] for evaluation of cardiac murmur.  Her cardiac murmur is concordant with aortic valve stenosis and her initial echocardiographic evaluation in September 2018 demonstrated normal systolic function with grade 1 diastolic dysfunction.  .  There was moderate aortic stenosis with a mean gradient of 28, peak instantaneous gradient to 45 mm Hg. in addition, there was poststenotic aortic root dilatation at 4.5 cm.  when I initially saw her, she was completely asymptomatic with reference to chest pain, PND, orthopnea, presyncope or syncope and did not have any signs or symptoms of CHF.  She was hypertensive.  With the addition of losartan 25 mg once daily her blood pressure has improved but remains still elevated.  Over the past 6 months, she has continued to remain entirely asymptomatic.  I had a long discussion with she and her husband and reviewed her most recent echo Doppler study from 05/01/2017.  This has demonstrated progression of her aortic stenosis which is now moderately severe with a mean gradient of 37 and increased and her peak instantaneous gradient to 62 mm.  She again had a sending aortic diameter of 4.5 cm concordant with poststenotic aortic dilatation.  There was mild to moderate LA dilation.  PA peak pressure was 30 mm.  I again reviewed with her the symptoms associated with aortic stenosis.  The fact that her aortic stenosis has increased warrants continued close scrutiny.  With her blood pressure being mildly elevated and her increased diastolic dysfunction.  I am titrating losartan to 25 mg twice a day.  She tells me she will be undergoing repeat blood work by [name] in several weeks in March.  At that time, I will ask that a lipoprotein a be added to her regimen due to the reported increased incidence of increased LPa with aortic stenosis.  Previously-year-old  VLDL was increased at 122, and if this remains at that level.  I would suggest initiation of statin therapy.  I have recommended she undergo a follow-up echo Doppler study in July and I will see her back in August for follow-up evaluation.  Medication Adjustments/Labs and Tests Ordered: Current medicines are reviewed at length with the patient today.  Concerns regarding medicines are outlined above.  Medication changes, Labs and Tests ordered today are listed in the Patient Instructions below. Patient Instructions  Medication Instructions:  INCREASE Losartan to 25 mg two times daily  Labwork: Lab work at PCP next week, please have LP(a) drawn in addition (lab  order provided)  Testing/Procedures: Your physician has requested that you have an echocardiogram in July/August. Echocardiography is a painless test that uses sound waves to create images of your heart. It provides your doctor with information about the size and shape of your heart and how well your heart's chambers and valves are working. This procedure takes approximately one hour. There are no restrictions for this procedure.  This will be done at our [facility] location:  [place]: Your physician wants you to follow-up in: August with Dr. [name]. You will receive a reminder letter in the mail two months in advance. If you don't receive a letter, please call our office to schedule the follow-up appointment.   Any Other Special Instructions Will Be Listed Below (If Applicable).     If you need a refill on your cardiac medications before your next appointment, please call your pharmacy.      Signed, [name], MD  05/14/2017 8:27 PM    [facility] [address], [place], [place], [place]  [address] Phone: [phone]

## 2017-05-12 NOTE — Patient Instructions (Signed)
Medication Instructions:  INCREASE Losartan to 25 mg two times daily  Labwork: Lab work at PCP next week, please have LP(a) drawn in addition (lab order provided)  Testing/Procedures: Your physician has requested that you have an echocardiogram in July/August. Echocardiography is a painless test that uses sound waves to create images of your heart. It provides your doctor with information about the size and shape of your heart and how well your heart's chambers and valves are working. This procedure takes approximately one hour. There are no restrictions for this procedure.  This will be done at our [facility] location:  [place]: Your physician wants you to follow-up in: August with Dr. [name]. You will receive a reminder letter in the mail two months in advance. If you don't receive a letter, please call our office to schedule the follow-up appointment.   Any Other Special Instructions Will Be Listed Below (If Applicable).     If you need a refill on your cardiac medications before your next appointment, please call your pharmacy.

## 2017-05-14 ENCOUNTER — Encounter: Payer: Self-pay | Admitting: Cardiovascular Disease

## 2017-05-21 DIAGNOSIS — Z6827 Body mass index (BMI) 27.0-27.9, adult: Secondary | ICD-10-CM | POA: Diagnosis not present

## 2017-05-21 DIAGNOSIS — F419 Anxiety disorder, unspecified: Secondary | ICD-10-CM | POA: Diagnosis not present

## 2017-05-21 DIAGNOSIS — I10 Essential (primary) hypertension: Secondary | ICD-10-CM | POA: Diagnosis not present

## 2017-06-03 DIAGNOSIS — E785 Hyperlipidemia, unspecified: Secondary | ICD-10-CM | POA: Diagnosis not present

## 2017-06-03 DIAGNOSIS — I10 Essential (primary) hypertension: Secondary | ICD-10-CM | POA: Diagnosis not present

## 2017-06-03 DIAGNOSIS — M859 Disorder of bone density and structure, unspecified: Secondary | ICD-10-CM | POA: Diagnosis not present

## 2017-06-03 DIAGNOSIS — R82998 Other abnormal findings in urine: Secondary | ICD-10-CM | POA: Diagnosis not present

## 2017-06-04 ENCOUNTER — Ambulatory Visit: Payer: Medicare PPO | Admitting: Cardiovascular Disease

## 2017-06-05 DIAGNOSIS — Z1231 Encounter for screening mammogram for malignant neoplasm of breast: Secondary | ICD-10-CM | POA: Diagnosis not present

## 2017-06-10 DIAGNOSIS — Q231 Congenital insufficiency of aortic valve: Secondary | ICD-10-CM | POA: Diagnosis not present

## 2017-06-10 DIAGNOSIS — Z23 Encounter for immunization: Secondary | ICD-10-CM | POA: Diagnosis not present

## 2017-06-10 DIAGNOSIS — E7849 Other hyperlipidemia: Secondary | ICD-10-CM | POA: Diagnosis not present

## 2017-06-10 DIAGNOSIS — Z1212 Encounter for screening for malignant neoplasm of rectum: Secondary | ICD-10-CM | POA: Diagnosis not present

## 2017-06-10 DIAGNOSIS — Z0000 Encounter for general adult medical examination without abnormal findings: Secondary | ICD-10-CM | POA: Diagnosis not present

## 2017-06-10 DIAGNOSIS — Z1389 Encounter for screening for other disorder: Secondary | ICD-10-CM | POA: Diagnosis not present

## 2017-06-10 DIAGNOSIS — I350 Nonrheumatic aortic (valve) stenosis: Secondary | ICD-10-CM | POA: Diagnosis not present

## 2017-06-10 DIAGNOSIS — I10 Essential (primary) hypertension: Secondary | ICD-10-CM | POA: Diagnosis not present

## 2017-06-10 DIAGNOSIS — R011 Cardiac murmur, unspecified: Secondary | ICD-10-CM | POA: Diagnosis not present

## 2017-06-10 DIAGNOSIS — F419 Anxiety disorder, unspecified: Secondary | ICD-10-CM | POA: Diagnosis not present

## 2017-10-21 ENCOUNTER — Ambulatory Visit (HOSPITAL_COMMUNITY): Payer: Medicare HMO | Attending: Cardiovascular Disease

## 2017-10-21 ENCOUNTER — Other Ambulatory Visit: Payer: Self-pay

## 2017-10-21 DIAGNOSIS — I77819 Aortic ectasia, unspecified site: Secondary | ICD-10-CM | POA: Insufficient documentation

## 2017-10-21 DIAGNOSIS — I119 Hypertensive heart disease without heart failure: Secondary | ICD-10-CM | POA: Insufficient documentation

## 2017-10-21 DIAGNOSIS — I080 Rheumatic disorders of both mitral and aortic valves: Secondary | ICD-10-CM | POA: Diagnosis not present

## 2017-10-21 DIAGNOSIS — E785 Hyperlipidemia, unspecified: Secondary | ICD-10-CM | POA: Diagnosis not present

## 2017-10-21 DIAGNOSIS — I350 Nonrheumatic aortic (valve) stenosis: Secondary | ICD-10-CM

## 2017-12-09 DIAGNOSIS — I350 Nonrheumatic aortic (valve) stenosis: Secondary | ICD-10-CM | POA: Diagnosis not present

## 2017-12-09 DIAGNOSIS — J302 Other seasonal allergic rhinitis: Secondary | ICD-10-CM | POA: Diagnosis not present

## 2017-12-09 DIAGNOSIS — E663 Overweight: Secondary | ICD-10-CM | POA: Diagnosis not present

## 2017-12-09 DIAGNOSIS — Z23 Encounter for immunization: Secondary | ICD-10-CM | POA: Diagnosis not present

## 2017-12-09 DIAGNOSIS — Z6379 Other stressful life events affecting family and household: Secondary | ICD-10-CM | POA: Diagnosis not present

## 2017-12-09 DIAGNOSIS — R011 Cardiac murmur, unspecified: Secondary | ICD-10-CM | POA: Diagnosis not present

## 2017-12-09 DIAGNOSIS — E7849 Other hyperlipidemia: Secondary | ICD-10-CM | POA: Diagnosis not present

## 2017-12-09 DIAGNOSIS — I10 Essential (primary) hypertension: Secondary | ICD-10-CM | POA: Diagnosis not present

## 2017-12-09 DIAGNOSIS — Q231 Congenital insufficiency of aortic valve: Secondary | ICD-10-CM | POA: Diagnosis not present

## 2017-12-09 DIAGNOSIS — M859 Disorder of bone density and structure, unspecified: Secondary | ICD-10-CM | POA: Diagnosis not present

## 2017-12-14 ENCOUNTER — Ambulatory Visit: Payer: Medicare HMO | Admitting: Cardiovascular Disease

## 2017-12-14 VITALS — BP 168/92 | HR 70 | Ht 64.5 in | Wt 163.4 lb

## 2017-12-14 DIAGNOSIS — E785 Hyperlipidemia, unspecified: Secondary | ICD-10-CM | POA: Diagnosis not present

## 2017-12-14 DIAGNOSIS — I10 Essential (primary) hypertension: Secondary | ICD-10-CM | POA: Diagnosis not present

## 2017-12-14 DIAGNOSIS — I5189 Other ill-defined heart diseases: Secondary | ICD-10-CM

## 2017-12-14 DIAGNOSIS — I350 Nonrheumatic aortic (valve) stenosis: Secondary | ICD-10-CM

## 2017-12-14 DIAGNOSIS — I77810 Thoracic aortic ectasia: Secondary | ICD-10-CM

## 2017-12-14 DIAGNOSIS — I519 Heart disease, unspecified: Secondary | ICD-10-CM | POA: Diagnosis not present

## 2017-12-14 MED ORDER — ROSUVASTATIN CALCIUM 20 MG PO TABS: 20.0000 mg | ORAL_TABLET | Freq: Every day | ORAL | 3 refills | Status: DC

## 2017-12-14 NOTE — Progress Notes (Signed)
Cardiology Office Note    Date:  12/16/2017   ID:  Joann Barry, Joann Barry Apr 14, 1945, MRN [id number]  PCP:  [name], MD  Cardiologist:  [name], MD   No chief complaint on file.  F/U evaluation, referred through the courtesy of Dr. [name] for evaluation of aortic stenosis.  History of Present Illness:  Joann Barry is a 72 y.o. female who I had seen in October 2016 when she was referred by [name] for evaluation of aortic stenosis.  I last saw her in February 2019.  She presents for a 33-month follow-up evaluation.  Joann Barry admits to a 30 year history of hypertension and has been on atenolol 50 mg daily.  She also has a history of osteopenia, anxiety, and GERD.  She had recently seen [name] in because of a systolic cardiac murmur.  She was referred for echo Doppler study which was done on 12/02/2016.  This showed an EF of 55-60%.  She had normal wall motion with grade 1 diastolic dysfunction.  Aortic valve was moderately calcified and functionally bicuspid.  Valve motion was restricted.  She was felt to have moderate aortic stenosis with trivial AR.  Her mean gradient was 28, and peak instantaneous gradient 45 mm.  Her ascending aorta was mildly dilated at 4.5 cm..  There was mild MR, and there was mild left atrial dilatation.  When I initially saw her, she denied any episodes of chest pain or shortness of breath. .  She denied any presyncope or syncope and during periods of anxiety had noticed some mild increased respirations.  When I saw her, since she was asymptomatic.  I recommended close surveillance.  She was hypertensive and had grade 1 diastolic dysfunction and added low-dose losartan 25 mg daily to take with her previously atenolol regimen.  Since her initial evaluation, she has continued to be entirely asymptomatic.  There is only rare shortness of breath when she gets upset.  There is no change in exertional capacity.  She walks.  She denies  palpitations.  She underwent a follow-up echo Doppler study on 05/01/2017.  This has shown some progression of her aortic stenosis such that her mean gradient had increased to 37 mm with a peak instantaneous gradient of 62 mm.  Calculated valve area was 0.8 cm.  There was grade 2 diastolic dysfunction.    Since I last saw her on May 12, 2017, she has continued to remain asymptomatic.  She specifically denies chest pain, presyncope or syncope, or change in exercise tolerance.  I had recommended that when she had laboratory done with [name] office that they obtain an LP(a) and an increased level has been associated with aortic stenosis.  Her LP(a) level came back elevated at 180.  He has not been on any antilipid therapy.  Her cluster was 182, triglycerides 111, HDL 47, LDL 113.  Her TSH was 0.08 which is low.  She underwent a six-month follow-up echo Doppler study on October 21, 2017.  This essentially is unchanged and shows an EF of 60 to 65% with grade 2 diastolic dysfunction.  Her aortic valve is functionally bicuspid with fusion of the right and left coronary cusps and is calcified.  Her mean gradient was 34 with a peak gradient of 61.  Valve area was 0.73 cm.  Aortic root was mildly dilated with a sending aorta at 44 mm.  PA pressure was 33.  She presents for evaluation.  Past Medical History:  Diagnosis Date  . Hypertension   . Personal history of colonic polyps     Past Surgical History:  Procedure Laterality Date  . COLONOSCOPY    . PARTIAL HYSTERECTOMY  1980  . POLYPECTOMY      Current Medications: Outpatient Medications Prior to Visit  Medication Sig Dispense Refill  . atenolol (TENORMIN) 50 MG tablet Take 50 mg by mouth daily.    . Cholecalciferol (VITAMIN D3) 2000 units TABS Take 1 tablet by mouth daily.    Marland Kitchen losartan (COZAAR) 25 MG tablet Take 1 tablet (25 mg total) by mouth 2 (two) times daily. 180 tablet 3  . Multiple Vitamins-Minerals (MULTIVITAMIN PO) Take 1 capsule  by mouth daily.    Marland Kitchen ALPRAZolam (XANAX) 0.25 MG tablet Take 0.25 mg by mouth as needed.    . calcium carbonate (OS-CAL) 600 MG TABS tablet Take 600 mg by mouth daily with breakfast.    . Calcium-Vitamin D-Vitamin K (CALCIUM + D) 8581102842-40 MG-UNT-MCG CHEW Chew 1 capsule by mouth daily.    Marland Kitchen escitalopram (LEXAPRO) 10 MG tablet      No facility-administered medications prior to visit.      Allergies:   Sulfonamide derivatives   Social History   Socioeconomic History  . Marital status: Married    Spouse name: Not on file  . Number of children: Not on file  . Years of education: Not on file  . Highest education level: Not on file  Occupational History  . Not on file  Social Needs  . Financial resource strain: Not on file  . Food insecurity:    Worry: Not on file    Inability: Not on file  . Transportation needs:    Medical: Not on file    Non-medical: Not on file  Tobacco Use  . Smoking status: Never Smoker  . Smokeless tobacco: Never Used  Substance and Sexual Activity  . Alcohol use: No  . Drug use: No  . Sexual activity: Not on file  Lifestyle  . Physical activity:    Days per week: Not on file    Minutes per session: Not on file  . Stress: Not on file  Relationships  . Social connections:    Talks on phone: Not on file    Gets together: Not on file    Attends religious service: Not on file    Active member of club or organization: Not on file    Attends meetings of clubs or organizations: Not on file    Relationship status: Not on file  Other Topics Concern  . Not on file  Social History Narrative  . Not on file    Additional social history is notable in that she is married for 50 years.  She has 5 children and 16 great and grandchildren.  She works for [employer].  She completed 12th grade of education.  There is no tobacco history or alcohol use.  Family History:  The patient's family history includes Pancreatic cancer in her mother.  Her mother died at 55  from pancreatic cancer.  Her father died at 54.  One brother had neurofibromatosis.  ROS General: Negative; No fevers, chills, or night sweats;  HEENT: Negative; No changes in vision or hearing, sinus congestion, difficulty swallowing Pulmonary: Negative; No cough, wheezing, shortness of breath, hemoptysis Cardiovascular: Negative; No chest pain, presyncope, syncope, palpitations GI: Negative; No nausea, vomiting, diarrhea, or abdominal pain GU: Negative; No dysuria, hematuria, or difficulty voiding Musculoskeletal: Positive for osteopenia Hematologic/Oncology: Negative;  no easy bruising, bleeding Endocrine: Negative; no heat/cold intolerance; no diabetes Neuro: Negative; no changes in balance, headaches Skin: Negative; No rashes or skin lesions Psychiatric: Negative; No behavioral problems, depression Sleep: Negative; No snoring, daytime sleepiness, hypersomnolence, bruxism, restless legs, hypnogognic hallucinations, no cataplexy Other comprehensive 14 point system review is negative.   PHYSICAL EXAM:   VS:  BP (!) 168/92   Pulse 70   Ht 5' 4.5" (1.638 m)   Wt 163 lb 6.4 oz (74.1 kg)   BMI 27.61 kg/m     Repeat blood pressure was 140/88.  Wt Readings from Last 3 Encounters:  12/14/17 163 lb 6.4 oz (74.1 kg)  05/12/17 159 lb (72.1 kg)  12/30/16 165 lb 6.4 oz (75 kg)    General: Alert, oriented, no distress.  Skin: normal turgor, no rashes, warm and dry HEENT: Normocephalic, atraumatic. Pupils equal round and reactive to light; sclera anicteric; extraocular muscles intact;  Nose without nasal septal hypertrophy Mouth/Parynx benign; Mallinpatti scale 2 Neck: No JVD, no carotid bruits; normal carotid upstroke Lungs: clear to ausculatation and percussion; no wheezing or rales Chest wall: without tenderness to palpitation Heart: PMI not displaced, RRR, s1 s2 normal, 2/6 mid peaking systolic murmur aortic area concordant with aortic stenosis, no diastolic murmur, no rubs, gallops,  thrills, or heaves Abdomen: soft, nontender; no hepatosplenomehaly, BS+; abdominal aorta nontender and not dilated by palpation. Back: no CVA tenderness Pulses 2+ Musculoskeletal: full range of motion, normal strength, no joint deformities Extremities: no clubbing cyanosis or edema, Homan's sign negative  Neurologic: grossly nonfocal; Cranial nerves grossly wnl Psychologic: Normal mood and affect   Studies/Labs Reviewed:   EKG:  EKG is ordered today.  ECG (independently read by me): Normal sinus rhythm at 70 bpm.  No ectopy. No LVH or strain  February 2019 ECG (independently read by me): Sinus bradycardia 58 bpm.  PR interval 172 ms.  QTc interval 426 ms.  No ectopy.  October 2018 ECG (independently read by me): Normal sinus rhythm at 68 bpm.  Normal intervals.  No ST segment changes.  No ectopy.  Recent Labs: No flowsheet data found.   No flowsheet data found.  No flowsheet data found. No results found for: MCV No results found for: TSH No results found for: HGBA1C   BNP No results found for: BNP  ProBNP No results found for: PROBNP   Lipid Panel  No results found for: CHOL, TRIG, HDL, CHOLHDL, VLDL, LDLCALC, LDLDIRECT   RADIOLOGY: No results found.   Additional studies/ records that were reviewed today include:  I reviewed the medical records from [place] including the echo Doppler study.  Office visits, ECG, chest x-ray, and laboratory.  Glucose 108, BUN 13, Cr 0.8. Cholesterol 187, triglycerides 105, HDL 44, LDL 122.  Non-HDL 43. TSH 0.21 Vitamin D 27.6  Most recent laboratory at [facility] on December 14, 2017, LP(a) 180, BUN 16 creatinine 0.8.  Total cholesterol 182, triglycerides 111, HDL 47, LDL 113.  Non-HDL 135.  TSH 0.08.  Vitamin D 25.4.   ASSESSMENT:    1. Aortic valve stenosis, etiology of cardiac valve disease unspecified   2. Essential hypertension   3. Hyperlipidemia with target LDL less than 70   4. Dilated aortic root  ([place])   5. Grade II diastolic dysfunction     PLAN:  Joann Barry is a very pleasant 72 year old female who was initally referred by [name] for evaluation of cardiac murmur.  Her cardiac murmur is concordant with aortic  valve stenosis and her initial echocardiographic evaluation in September 2018 demonstrated normal systolic function with grade 1 diastolic dysfunction and moderate aortic stenosis with a mean gradient of 28, peak instantaneous gradient to 45 mm Hg. in addition, there was poststenotic aortic root dilatation at 4.5 cm.  When I initially saw her, she was completely asymptomatic with reference to chest pain, PND, orthopnea, presyncope or syncope and did not have any signs or symptoms of CHF.  She was hypertensive.  With the addition of losartan 25 mg once daily her blood pressure has improved but remained  elevated.  Subsequent office visit losartan was increased to 25 mg twice a day.  I had recommended that she undergo a LP(a) test to see if this was elevated since there is reported increased incidence of increased LP(a) with aortic stenosis.  Her level came back elevated at 180.  In addition, her LDL cholesterol is 113.  I have recommended initiation of lipid-lowering therapy with Crestor 20 mg.  I reviewed her most recent echo Doppler study which essentially is unchanged from 6 months previously.  On her ECG there is no evidence for LV strain or LVH.  I again had a long discussion with her regarding her aortic stenosis.  We discussed options including cardiac catheterization with potential future need for valve replacement.  I have recommended a six-month follow-up echo Doppler study.  If she notices any potential change in development of symptoms I have recommended she see me sooner.  Otherwise I will see her in 6 months following in follow-up of her repeat echo.  She will undergo repeat laboratory with initiation of Crestor in several months.    Medication Adjustments/Labs and Tests  Ordered: Current medicines are reviewed at length with the patient today.  Concerns regarding medicines are outlined above.  Medication changes, Labs and Tests ordered today are listed in the Patient Instructions below. Patient Instructions  Medication Instructions:  START rosuvastatin (Crestor) 20 mg daily  Testing/Procedures: Your physician has requested that you have an echocardiogram in February/March 2020. Echocardiography is a painless test that uses sound waves to create images of your heart. It provides your doctor with information about the size and shape of your heart and how well your heart's chambers and valves are working. This procedure takes approximately one hour. There are no restrictions for this procedure.  This will be done at our [facility] location:  [place] with Dr. [name] (after echo)  Any Other Special Instructions Will Be Listed Below (If Applicable).     If you need a refill on your cardiac medications before your next appointment, please call your pharmacy.      Signed, [name], MD  12/16/2017 10:54 AM    [place] [address], [place], [place], [place]  [address] Phone: [phone]

## 2017-12-14 NOTE — Patient Instructions (Signed)
Medication Instructions:  START rosuvastatin (Crestor) 20 mg daily  Testing/Procedures: Your physician has requested that you have an echocardiogram in February/March 2020. Echocardiography is a painless test that uses sound waves to create images of your heart. It provides your doctor with information about the size and shape of your heart and how well your heart's chambers and valves are working. This procedure takes approximately one hour. There are no restrictions for this procedure.  This will be done at our [facility] location:  [place] with Dr. [name] (after echo)  Any Other Special Instructions Will Be Listed Below (If Applicable).     If you need a refill on your cardiac medications before your next appointment, please call your pharmacy.

## 2017-12-16 ENCOUNTER — Encounter: Payer: Self-pay | Admitting: Cardiovascular Disease

## 2018-01-15 ENCOUNTER — Ambulatory Visit: Payer: Medicare HMO | Admitting: Cardiovascular Disease

## 2018-02-02 DIAGNOSIS — M25551 Pain in right hip: Secondary | ICD-10-CM | POA: Diagnosis not present

## 2018-02-02 DIAGNOSIS — Z6827 Body mass index (BMI) 27.0-27.9, adult: Secondary | ICD-10-CM | POA: Diagnosis not present

## 2018-02-02 DIAGNOSIS — R1031 Right lower quadrant pain: Secondary | ICD-10-CM | POA: Diagnosis not present

## 2018-05-27 ENCOUNTER — Other Ambulatory Visit: Payer: Self-pay

## 2018-05-27 ENCOUNTER — Encounter (INDEPENDENT_AMBULATORY_CARE_PROVIDER_SITE_OTHER): Payer: Self-pay

## 2018-05-27 ENCOUNTER — Ambulatory Visit (HOSPITAL_COMMUNITY): Payer: Medicare Other | Attending: Cardiovascular Disease

## 2018-05-27 DIAGNOSIS — I350 Nonrheumatic aortic (valve) stenosis: Secondary | ICD-10-CM | POA: Diagnosis present

## 2018-06-02 ENCOUNTER — Telehealth: Payer: Self-pay | Admitting: *Deleted

## 2018-06-02 NOTE — Telephone Encounter (Signed)
   Cardiac Questionnaire:    Since your last visit or hospitalization:    1. Have you been having new or worsening chest pain? No   2. Have you been having new or worsening shortness of breath? No 3. Have you been having new or worsening leg swelling, wt gain, or increase in abdominal girth (pants fitting more tightly)? No   4. Have you had any passing out spells? No    *A YES to any of these questions would result in the appointment being kept. *If all the answers to these questions are NO, we should indicate that given the current situation regarding the worldwide coronarvirus pandemic, at the recommendation of the CDC, we are looking to limit gatherings in our waiting area, and thus will reschedule their appointment beyond four weeks from today.   _____________   [id number] Pre-Screening Questions:  . Have you recently travelled abroad or to [place], [place] state, or [place]? No (3) . Do you currently have a fever? No (3) . Have you been in contact with someone that is currently pending confirmation of Covid19 testing or has been confirmed to have the [place] virus?  No (3) . Are you currently experiencing fatigue or cough? No (2) . Are you currently experiencing shortness of breath at rest or with minimal activity? No (1) . Have you been in contact with someone that was recently sick with fever/cough/fatigue? No (1)   **A score of 3 or more should result in cancellation of the pts cardiology appt.  **A score of  2 should be provided a mask prior to admission into the lobby  *Travel to a high risk area or contact with a confirmed case should stay at home, away from confirmed  patient, monitor symptoms, and reach out to PCP for e-visit, additional testing.  *ALL PTS W/ FEVER SHOULD BE REFERRED TO PCP FOR E-VISIT*        Primary Cardiologist:  No primary care provider on file.   Patient contacted.  History reviewed.  No symptoms to suggest any unstable cardiac conditions.  Based on  discussion, with current pandemic situation, we will be postponing this appointment for [facility].  If symptoms change, she has been instructed to contact our office.  Routing to C19 CANCEL pool for tracking (P CV DIV CV19 CANCEL).  Barbaraann Barthel  06/02/2018 3:28 PM         .

## 2018-06-03 ENCOUNTER — Ambulatory Visit: Payer: Medicare HMO | Admitting: Cardiovascular Disease

## 2018-07-04 ENCOUNTER — Other Ambulatory Visit: Payer: Self-pay | Admitting: Cardiovascular Disease

## 2018-07-05 NOTE — Telephone Encounter (Signed)
Losartan 25 mg refilled. 

## 2018-09-27 ENCOUNTER — Ambulatory Visit (INDEPENDENT_AMBULATORY_CARE_PROVIDER_SITE_OTHER): Payer: Medicare Other | Admitting: Cardiovascular Disease

## 2018-09-27 ENCOUNTER — Encounter: Payer: Self-pay | Admitting: Cardiovascular Disease

## 2018-09-27 ENCOUNTER — Other Ambulatory Visit: Payer: Self-pay

## 2018-09-27 VITALS — BP 165/89 | HR 62 | Temp 97.2°F | Ht 64.5 in | Wt 163.2 lb

## 2018-09-27 DIAGNOSIS — I350 Nonrheumatic aortic (valve) stenosis: Secondary | ICD-10-CM | POA: Diagnosis not present

## 2018-09-27 DIAGNOSIS — I10 Essential (primary) hypertension: Secondary | ICD-10-CM | POA: Diagnosis not present

## 2018-09-27 DIAGNOSIS — E7841 Elevated Lipoprotein(a): Secondary | ICD-10-CM

## 2018-09-27 DIAGNOSIS — I519 Heart disease, unspecified: Secondary | ICD-10-CM | POA: Diagnosis not present

## 2018-09-27 DIAGNOSIS — E785 Hyperlipidemia, unspecified: Secondary | ICD-10-CM | POA: Diagnosis not present

## 2018-09-27 DIAGNOSIS — I5189 Other ill-defined heart diseases: Secondary | ICD-10-CM

## 2018-09-27 MED ORDER — ROSUVASTATIN CALCIUM 20 MG PO TABS: 20.0000 mg | ORAL_TABLET | Freq: Every day | ORAL | 3 refills | Status: DC

## 2018-09-27 NOTE — Patient Instructions (Signed)
Medication Instructions:  Start Rosuvastatin 20 mg (take 1/2 tablet daily, if tolerating increase to a whole tablet) If you need a refill on your cardiac medications before your next appointment, please call your pharmacy.   Lab work: CMET, LIPID, LPa If you have labs (blood work) drawn today and your tests are completely normal, you will receive your results only by: Marland Kitchen MyChart Message (if you have MyChart) OR . A paper copy in the mail If you have any lab test that is abnormal or we need to change your treatment, we will call you to review the results.  Testing/Procedures: Echocardiogram (in December) - Your physician has requested that you have an echocardiogram. Echocardiography is a painless test that uses sound waves to create images of your heart. It provides your doctor with information about the size and shape of your heart and how well your heart's chambers and valves are working. This procedure takes approximately one hour. There are no restrictions for this procedure. This will be performed at our [facility] location - [address].   Follow-Up: At [facility], you and your health needs are our priority.  As part of our continuing mission to provide you with exceptional heart care, we have created designated Provider Care Teams.  These Care Teams include your primary Cardiologist (physician) and Advanced Practice Providers (APPs -  Physician Assistants and Nurse Practitioners) who all work together to provide you with the care you need, when you need it. You will need a follow up appointment in 5 months (after testing).  Please call our office 2 months in advance to schedule this appointment.  You may see Dr.Kelly or one of the following Advanced Practice Providers on your designated Care Team: [name], [place] . Fabian Sharp, PA-C

## 2018-09-27 NOTE — Progress Notes (Addendum)
Cardiology Office Note    Date:  09/29/2018   ID:  Joann Barry, Joann Barry 06/26/1945, MRN [id number]  PCP:  [name], MD  Cardiologist:  [name], MD   No chief complaint on file.  F/U evaluation, referred through the courtesy of Dr. [name] for evaluation of aortic stenosis.  History of Present Illness:  Joann Barry is a 73 y.o. female who I had seen in October 2016 when she was referred by [name] for evaluation of aortic stenosis.  She presents for a 10 month follow evaluation..  Joann Barry has a >  30 year history of hypertension and has been on atenolol 50 mg daily.  She also has a history of osteopenia, anxiety, and GERD.  She had recently seen [name] in because of a systolic cardiac murmur.  She was referred for echo Doppler study which was done on 12/02/2016.  This showed an EF of 55-60%.  She had normal wall motion with grade 1 diastolic dysfunction.  Aortic valve was moderately calcified and functionally bicuspid.  Valve motion was restricted.  She was felt to have moderate aortic stenosis with trivial AR.  Her mean gradient was 28, and peak instantaneous gradient 45 mm.  Her ascending aorta was mildly dilated at 4.5 cm..  There was mild MR, and there was mild left atrial dilatation.  When I initially saw her, she denied any episodes of chest pain or shortness of breath. .  She denied any presyncope or syncope and during periods of anxiety had noticed some mild increased respirations.  When I saw her, since she was asymptomatic.  I recommended close surveillance.  She was hypertensive and had grade 1 diastolic dysfunction and added low-dose losartan 25 mg daily to take with her previously atenolol regimen.  Since her initial evaluation, she has continued to be entirely asymptomatic.  There is only rare shortness of breath when she gets upset.  There is no change in exertional capacity.  She walks.  She denies palpitations.  She underwent a follow-up echo  Doppler study on 05/01/2017.  This has shown some progression of her aortic stenosis such that her mean gradient had increased to 37 mm with a peak instantaneous gradient of 62 mm.  Calculated valve area was 0.8 cm.  There was grade 2 diastolic dysfunction.    When I saw her on May 12, 2017, she  continued to remain asymptomatic.  She specifically denies chest pain, presyncope or syncope, or change in exercise tolerance.  I had recommended that when she had laboratory done with [name] office that they obtain an LP(a) and an increased level has been associated with aortic stenosis.  Her LP(a) level came back elevated at 180.  She has not been on any antilipid therapy.  TC was 182, triglycerides 111, HDL 47, LDL 113.  Her TSH was 0.08 which is low.  She underwent a six-month follow-up echo Doppler study on October 21, 2017.  This essentially is unchanged and shows an EF of 60 to 65% with grade 2 diastolic dysfunction.  Her aortic valve is functionally bicuspid with fusion of the right and left coronary cusps and is calcified.  Her mean gradient was 34 with a peak gradient of 61.  Valve area was 0.73 cm.  Aortic root was mildly dilated with acsending aorta at 44 mm.  PA pressure was 33.    When I last saw her in September 2019 I reviewed her echo Doppler data.  I  recommended initiation of Crestor 20 mg.  Her ECG did not show evidence for LV strain or LVH.  I again had a long discussion regarding aortic stenosis and she remained completely asymptomatic.  Since I last saw her, she has continued to do well.  She continues to be entirely asymptomatic.  She denies chest pain PND orthopnea exertional dyspnea presyncope or syncope.  She notes a rare episode of shortness of breath when she gets very upset.  She denies any exertional dyspnea.  She has continued to be on atenolol 50 mg daily, losartan 25 mg twice a day.  Apparently she has not been taking her rosuvastatin and actually never got this  prescription filled.  She underwent repeat laboratory in September 2019 by [name].  LDL cholesterol was 113.   On a follow-up echo Doppler study on May 27, 2018 which showed normal EF of 55 to 60% with only mildly increased wall thickness.  There was suggestion of grade 2 diastolic dysfunction.  Her left atrial size was moderately dilated.  Visually it appeared that her aortic stenosis was in the moderate to moderately severe range.  Mean gradient was 31 with a peak instantaneous gradient of 55.  She presents for follow-up evaluation.  Past Medical History:  Diagnosis Date  . Hypertension   . Personal history of colonic polyps     Past Surgical History:  Procedure Laterality Date  . COLONOSCOPY    . PARTIAL HYSTERECTOMY  1980  . POLYPECTOMY      Current Medications: Outpatient Medications Prior to Visit  Medication Sig Dispense Refill  . atenolol (TENORMIN) 50 MG tablet Take 50 mg by mouth daily.    . Cholecalciferol (VITAMIN D3) 2000 units TABS Take 1 tablet by mouth daily.    Marland Kitchen losartan (COZAAR) 25 MG tablet Take 1 tablet by mouth twice daily 180 tablet 0  . Multiple Vitamins-Minerals (MULTIVITAMIN PO) Take 1 capsule by mouth daily.    . rosuvastatin (CRESTOR) 20 MG tablet Take 1 tablet (20 mg total) by mouth daily. 90 tablet 3   No facility-administered medications prior to visit.      Allergies:   Sulfonamide derivatives   Social History   Socioeconomic History  . Marital status: Married    Spouse name: Not on file  . Number of children: Not on file  . Years of education: Not on file  . Highest education level: Not on file  Occupational History  . Not on file  Social Needs  . Financial resource strain: Not on file  . Food insecurity    Worry: Not on file    Inability: Not on file  . Transportation needs    Medical: Not on file    Non-medical: Not on file  Tobacco Use  . Smoking status: Never Smoker  . Smokeless tobacco: Never Used  Substance and Sexual  Activity  . Alcohol use: No  . Drug use: No  . Sexual activity: Not on file  Lifestyle  . Physical activity    Days per week: Not on file    Minutes per session: Not on file  . Stress: Not on file  Relationships  . Social Herbalist on phone: Not on file    Gets together: Not on file    Attends religious service: Not on file    Active member of club or organization: Not on file    Attends meetings of clubs or organizations: Not on file    Relationship status:  Not on file  Other Topics Concern  . Not on file  Social History Narrative  . Not on file    Additional social history is notable in that she is married for 50 years.  She has 5 children and 16 great and grandchildren.  She works for [employer].  She completed 12th grade of education.  There is no tobacco history or alcohol use.  Family History:  The patient's family history includes Pancreatic cancer in her mother.  Her mother died at 79 from pancreatic cancer.  Her father died at 20.  One brother had neurofibromatosis.  ROS General: Negative; No fevers, chills, or night sweats;  HEENT: Negative; No changes in vision or hearing, sinus congestion, difficulty swallowing Pulmonary: Negative; No cough, wheezing, shortness of breath, hemoptysis Cardiovascular: Negative; No chest pain, presyncope, syncope, palpitations GI: Negative; No nausea, vomiting, diarrhea, or abdominal pain GU: Negative; No dysuria, hematuria, or difficulty voiding Musculoskeletal: Positive for osteopenia Hematologic/Oncology: Negative; no easy bruising, bleeding Endocrine: Negative; no heat/cold intolerance; no diabetes Neuro: Negative; no changes in balance, headaches Skin: Negative; No rashes or skin lesions Psychiatric: Negative; No behavioral problems, depression Sleep: Negative; No snoring, daytime sleepiness, hypersomnolence, bruxism, restless legs, hypnogognic hallucinations, no cataplexy Other comprehensive 14 point system review is  negative.   PHYSICAL EXAM:   VS:  BP (!) 165/89   Pulse 62   Temp (!) 97.2 F (36.2 C)   Ht 5' 4.5" (1.638 m)   Wt 163 lb 3.2 oz (74 kg)   SpO2 95%   BMI 27.58 kg/m     Repeat blood pressure by me was 168/90  Wt Readings from Last 3 Encounters:  09/27/18 163 lb 3.2 oz (74 kg)  12/14/17 163 lb 6.4 oz (74.1 kg)  05/12/17 159 lb (72.1 kg)    General: Alert, oriented, no distress.  Skin: normal turgor, no rashes, warm and dry HEENT: Normocephalic, atraumatic. Pupils equal round and reactive to light; sclera anicteric; extraocular muscles intact;  Nose without nasal septal hypertrophy Mouth/Parynx benign;  Neck: No JVD, no carotid bruits; normal carotid upstroke Lungs: clear to ausculatation and percussion; no wheezing or rales Chest wall: without tenderness to palpitation Heart: PMI not displaced, RRR, s1 s2 normal, 2/6 mid peaking systolic murmur in the aortic area, no diastolic murmur, no rubs, gallops, thrills, or heaves Abdomen: soft, nontender; no hepatosplenomehaly, BS+; abdominal aorta nontender and not dilated by palpation. Back: no CVA tenderness Pulses 2+ Musculoskeletal: full range of motion, normal strength, no joint deformities Extremities: no clubbing cyanosis or edema, Homan's sign negative  Neurologic: grossly nonfocal; Cranial nerves grossly wnl Psychologic: Normal mood and affect   Studies/Labs Reviewed:   EKG:  EKG is ordered today.  ECG (independently read by me): Sinus Bradycardia at 58; no ectopy, LVH or strain  September 2019 ECG (independently read by me): Normal sinus rhythm at 70 bpm.  No ectopy. No LVH or strain  February 2019 ECG (independently read by me): Sinus bradycardia 58 bpm.  PR interval 172 ms.  QTc interval 426 ms.  No ectopy.  October 2018 ECG (independently read by me): Normal sinus rhythm at 68 bpm.  Normal intervals.  No ST segment changes.  No ectopy.  Recent Labs: No flowsheet data found.   No flowsheet data found.  No  flowsheet data found. No results found for: MCV No results found for: TSH No results found for: HGBA1C   BNP No results found for: BNP  ProBNP No results found for: PROBNP  Lipid Panel  No results found for: CHOL, TRIG, HDL, CHOLHDL, VLDL, LDLCALC, LDLDIRECT   RADIOLOGY: No results found.   Additional studies/ records that were reviewed today include:  I reviewed the medical records from [place] including the echo Doppler study.  Office visits, ECG, chest x-ray, and laboratory.  Glucose 108, BUN 13, Cr 0.8. Cholesterol 187, triglycerides 105, HDL 44, LDL 122.  Non-HDL 43. TSH 0.21 Vitamin D 27.6  Laboratory at [facility] on December 14, 2017, LP(a) 180, BUN 16 creatinine 0.8.  Total cholesterol 182, triglycerides 111, HDL 47, LDL 113.  Non-HDL 135.  TSH 0.08.  Vitamin D 25.4.   ASSESSMENT:    1. Aortic valve stenosis, etiology of cardiac valve disease unspecified   2. Essential hypertension   3. Grade II diastolic dysfunction   4. Hyperlipidemia with target LDL less than 70   5. Elevated lipoprotein(a)     PLAN:  Joann Barry is a very pleasant 73 year old female who was initally referred by [name] for evaluation of cardiac murmur.  Her cardiac murmur is concordant with aortic valve stenosis and her initial echocardiographic evaluation in September 2018 demonstrated normal systolic function with grade 1 diastolic dysfunction and moderate aortic stenosis with a mean gradient of 28, peak instantaneous gradient to 45 mm Hg. in addition, there was poststenotic aortic root dilatation at 4.5 cm.  When I initially saw her, she was completely asymptomatic with reference to chest pain, PND, orthopnea, presyncope or syncope and did not have any signs or symptoms of CHF.  She was hypertensive.  With the addition of losartan 25 mg once daily her blood pressure has improved but remained elevated and subsequently was further titrated to 25 mg twice a day losartan  was increased to 25 mg twice a day.  Since there is a reported increased incidence of increased LP(a) with aortic stenosis, I obtained an LPA evaluation which proved to be significantly elevated at 180.  I last saw her, her LDL cholesterol was 113.  I had recommended initiation of rosuvastatin which she never filled.  I again discussed this with her today.  I have again recommended initiation of therapy and she will initially start out with half a pill (10 mg to see how she tolerates this and if so increase this to 20 mg.  In the future there will be specific therapy potentially aimed at reducing LP(a).  PCSK9 inhibitors have been shown to reduce this level by approximately 26%.  I reviewed her most recent echo Doppler study which revealed normal LV function with probable grade 2 diastolic dysfunction.  Her mean aortic gradient had not progressed and was 31 with a peak instantaneous gradient of 55 mm.  I again discussed the symptoms associated with aortic stenosis and she remains entirely asymptomatic.  Her ECG today remains stable and does not show signs of LVH or LV strain.  Her blood pressure is elevated.  She had not taken her medication.  I have recommended close surveillance of her blood pressure.  She will be following up with [name] and her blood pressure remains elevated additional medic Acacian adjustment will be necessary.  She will initiate rosuvastatin and I have recommended in several months she undergo a CMP, lipid studies and LP(a) for reevaluation.  In December 2020 I have recommended a follow-up echo Doppler study for continued close surveillance of her moderately severe aortic valve stenosis.  She again was made fully aware of symptoms associated with aortic stenosis and if  she does note any change in symptomatology a sooner evaluation was recommended.    Medication Adjustments/Labs and Tests Ordered: Current medicines are reviewed at length with the patient today.  Concerns regarding  medicines are outlined above.  Medication changes, Labs and Tests ordered today are listed in the Patient Instructions below. Patient Instructions  Medication Instructions:  Start Rosuvastatin 20 mg (take 1/2 tablet daily, if tolerating increase to a whole tablet) If you need a refill on your cardiac medications before your next appointment, please call your pharmacy.   Lab work: CMET, LIPID, LPa If you have labs (blood work) drawn today and your tests are completely normal, you will receive your results only by: Marland Kitchen MyChart Message (if you have MyChart) OR . A paper copy in the mail If you have any lab test that is abnormal or we need to change your treatment, we will call you to review the results.  Testing/Procedures: Echocardiogram (in December) - Your physician has requested that you have an echocardiogram. Echocardiography is a painless test that uses sound waves to create images of your heart. It provides your doctor with information about the size and shape of your heart and how well your heart's chambers and valves are working. This procedure takes approximately one hour. There are no restrictions for this procedure. This will be performed at our [facility] location - [address], [address].   Follow-Up: At [facility], you and your health needs are our priority.  As part of our continuing mission to provide you with exceptional heart care, we have created designated Provider Care Teams.  These Care Teams include your primary Cardiologist (physician) and Advanced Practice Providers (APPs -  Physician Assistants and Nurse Practitioners) who all work together to provide you with the care you need, when you need it. You will need a follow up appointment in 5 months (after testing).  Please call our office 2 months in advance to schedule this appointment.  You may see Dr.Azhane Eckart or one of the following Advanced Practice Providers on your designated Care Team: [name], [place] . Fabian Sharp, PA-C        Signed, [name], MD  09/29/2018 1:44 PM    [facility] [address], [place], [place], [place]  [address] Phone: [phone]

## 2018-09-29 ENCOUNTER — Encounter: Payer: Self-pay | Admitting: Cardiovascular Disease

## 2018-10-14 ENCOUNTER — Other Ambulatory Visit: Payer: Self-pay | Admitting: Cardiovascular Disease

## 2018-12-24 ENCOUNTER — Other Ambulatory Visit: Payer: Self-pay | Admitting: Internal Medicine

## 2018-12-24 DIAGNOSIS — E041 Nontoxic single thyroid nodule: Secondary | ICD-10-CM

## 2018-12-31 ENCOUNTER — Ambulatory Visit
Admission: RE | Admit: 2018-12-31 | Discharge: 2018-12-31 | Disposition: A | Discharge status: Home / Self Care | Payer: Medicare Other | Source: Ambulatory Visit | Attending: Internal Medicine | Admitting: Internal Medicine

## 2018-12-31 DIAGNOSIS — E041 Nontoxic single thyroid nodule: Secondary | ICD-10-CM

## 2019-01-06 ENCOUNTER — Other Ambulatory Visit: Payer: Self-pay | Admitting: Internal Medicine

## 2019-01-06 DIAGNOSIS — E041 Nontoxic single thyroid nodule: Secondary | ICD-10-CM

## 2019-01-13 ENCOUNTER — Other Ambulatory Visit: Payer: Self-pay | Admitting: Cardiovascular Disease

## 2019-01-19 ENCOUNTER — Other Ambulatory Visit: Payer: Medicare Other

## 2019-02-08 ENCOUNTER — Ambulatory Visit
Admission: RE | Admit: 2019-02-08 | Discharge: 2019-02-08 | Disposition: A | Discharge status: Home / Self Care | Payer: Medicare Other | Source: Ambulatory Visit | Attending: Internal Medicine | Admitting: Internal Medicine

## 2019-02-08 ENCOUNTER — Other Ambulatory Visit (HOSPITAL_COMMUNITY)
Admission: RE | Admit: 2019-02-08 | Discharge: 2019-02-08 | Disposition: A | Discharge status: Home / Self Care | Payer: Medicare Other | Source: Ambulatory Visit | Attending: Internal Medicine | Admitting: Internal Medicine

## 2019-02-08 DIAGNOSIS — E041 Nontoxic single thyroid nodule: Secondary | ICD-10-CM

## 2019-02-08 DIAGNOSIS — D34 Benign neoplasm of thyroid gland: Secondary | ICD-10-CM | POA: Insufficient documentation

## 2019-02-09 ENCOUNTER — Other Ambulatory Visit: Payer: Medicare Other

## 2019-02-09 LAB — CYTOLOGY - NON PAP

## 2019-02-16 ENCOUNTER — Encounter: Payer: Self-pay | Admitting: Internal Medicine

## 2019-02-28 ENCOUNTER — Other Ambulatory Visit: Payer: Self-pay

## 2019-02-28 ENCOUNTER — Encounter (HOSPITAL_COMMUNITY): Payer: Self-pay

## 2019-02-28 ENCOUNTER — Ambulatory Visit (HOSPITAL_COMMUNITY): Payer: Medicare Other | Attending: Cardiovascular Disease

## 2019-02-28 DIAGNOSIS — I350 Nonrheumatic aortic (valve) stenosis: Secondary | ICD-10-CM | POA: Insufficient documentation

## 2019-02-28 DIAGNOSIS — I10 Essential (primary) hypertension: Secondary | ICD-10-CM | POA: Insufficient documentation

## 2019-03-03 ENCOUNTER — Ambulatory Visit: Payer: Self-pay | Admitting: General Surgery

## 2019-03-03 ENCOUNTER — Telehealth: Payer: Self-pay

## 2019-03-03 NOTE — H&P (Signed)
History of Present Illness [name] MD; 03/03/2019 10:47 AM) The patient is a 73 year old female who presents with a thyroid nodule. Referred by:[name]  Chief Complaint: Thyroid nodule  Patient is a 73 year old female with a history of hypertension, aortic stenosis, who comes in with bilateral thyroid nodules. This was initially found physical exam. Patient underwent ultrasound and was found to have a 3.5 mid right-sided thyroid nodule and a 2.1 left inferior thyroid nodule. These both underwent FNA biopsy, With afirma. These are significant for right-sided being Bethesda 4 left side being the Bethesda 2. Afirma revealed a chance at 50% malignancy on the right side.  Patient denies any symptoms from the nodules currently at this time.  Patient is concerned about the nodules wants to proceed with total thyroidectomy. Patient understands that she will likely require thyroid supplementation life long.  Ultrasound findings: IMPRESSION: 1. 3.5 cm right mid thyroid nodule (nodule 1) and 2.1 cm left inferior thyroid nodule (nodule 2) meet criteria for fine-needle aspiration. 2. 0.6 cm left mid thyroid nodule meets criteria for 1 year follow-up ultrasound.   Past Surgical History (Tanisha A. Owens Shark, [place]; 03/03/2019 10:04 AM) No pertinent past surgical history   Diagnostic Studies History (Tanisha A. Owens Shark, [place]; 03/03/2019 10:04 AM) Colonoscopy  1-5 years ago Mammogram  within last year  Allergies (Tanisha A. Owens Shark, [place]; 03/03/2019 10:05 AM) Sulfa Antibiotics  Allergies Reconciled   Medication History (Tanisha A. Owens Shark, [place]; 03/03/2019 10:06 AM) Atenolol (50MG  Tablet, Oral) Active. Escitalopram Oxalate (10MG  Tablet, Oral) Active. Losartan Potassium (25MG  Tablet, Oral) Active. Rosuvastatin Calcium (20MG  Tablet, Oral) Active. Medications Reconciled  Social History (Tanisha A. Owens Shark, [place]; 03/03/2019 10:04 AM) Caffeine use  Coffee. No alcohol use  No drug  use  Tobacco use  Never smoker.  Family History (Tanisha A. Owens Shark, [place]; 03/03/2019 10:04 AM) Malignant Neoplasm Of Pancreas  Mother.  Pregnancy / Birth History (Tanisha A. Owens Shark, RMA; 03/03/2019 10:04 AM) Age at menarche  30 years. Age of menopause  71-50 Gravida  3 Maternal age  14-20 Para  3  Other Problems (Tanisha A. Owens Shark, [place]; 03/03/2019 10:04 AM) Arthritis  Heart murmur  High blood pressure  Hypercholesterolemia     Review of Systems [name] MD; 03/03/2019 10:43 AM) General Not Present- Appetite Loss, Chills, Fatigue, Fever, Night Sweats, Weight Gain and Weight Loss. Skin Not Present- Change in Wart/Mole, Dryness, Hives, Jaundice, New Lesions, Non-Healing Wounds, Rash and Ulcer. HEENT Not Present- Earache, Hearing Loss, Hoarseness, Nose Bleed, Oral Ulcers, Ringing in the Ears, Seasonal Allergies, Sinus Pain, Sore Throat, Visual Disturbances, Wears glasses/contact lenses and Yellow Eyes. Respiratory Not Present- Bloody sputum, Chronic Cough, Difficulty Breathing, Snoring and Wheezing. Breast Not Present- Breast Mass, Breast Pain, Nipple Discharge and Skin Changes. Cardiovascular Not Present- Chest Pain, Difficulty Breathing Lying Down, Leg Cramps, Palpitations, Rapid Heart Rate, Shortness of Breath and Swelling of Extremities. Gastrointestinal Not Present- Abdominal Pain, Bloating, Bloody Stool, Change in Bowel Habits, Chronic diarrhea, Constipation, Difficulty Swallowing, Excessive gas, Gets full quickly at meals, Hemorrhoids, Indigestion, Nausea, Rectal Pain and Vomiting. Female Genitourinary Not Present- Frequency, Nocturia, Painful Urination, Pelvic Pain and Urgency. Musculoskeletal Not Present- Back Pain, Joint Pain, Joint Stiffness, Muscle Pain, Muscle Weakness and Swelling of Extremities. Neurological Not Present- Decreased Memory, Fainting, Headaches, Numbness, Seizures, Tingling, Tremor, Trouble walking and Weakness. Endocrine Not Present- Cold  Intolerance, Excessive Hunger, Hair Changes, Heat Intolerance, Hot flashes and New Diabetes. Hematology Not Present- Blood Thinners, Easy Bruising, Excessive bleeding, Gland problems, HIV and Persistent Infections.  All other systems negative  Vitals (Tanisha A. Brown RMA; 03/03/2019 10:05 AM) 03/03/2019 10:05 AM Weight: 165.4 lb Height: 64.5in Body Surface Area: 1.81 m Body Mass Index: 27.95 kg/m  Temp.: 98.14F  Pulse: 74 (Regular)  BP: 142/86 (Sitting, Left Arm, Standard)       Physical Exam [name] MD; 03/03/2019 10:47 AM) The physical exam findings are as follows: Note:Constitutional: No acute distress, conversant, appears stated age  Eyes: Anicteric sclerae, moist conjunctiva, no lid lag  Neck: No thyromegaly, trachea midline, no cervical lymphadenopathy right mid thyroid nodule, left inferior thyroid nodule  Lungs: Clear to auscultation biilaterally, normal respiratory effot  Cardiovascular: regular rate & rhythm, no murmurs, no peripheal edema, pedal pulses 2+  GI: Soft, no masses or hepatosplenomegaly, non-tender to palpation  MSK: Normal gait, no clubbing cyanosis, edema  Skin: No rashes, palpation reveals normal skin turgor  Psychiatric: Appropriate judgment and insight, oriented to person, place, and time    Assessment & Plan [name] MD; 03/03/2019 10:48 AM) THYROID NODULE (E04.1) Impression: Patient is a 73 year old female with aortic stenosis, hypertension, bilateral thyroid nodules concerning for follicular carcinoma. I discussed the patient the opportunity for partial thyroidectomy versus total thyroidectomy. She is currently in favor proceeding with total thyroidectomy to avoid any further future observational ultrasounds. Patient understands that she will require lifelong thyroid supplementation.  1. Will proceed to the operating room for total thyroidectomy. 2. All risks and benefits were discussed with the patient to  generally include: infection, bleeding, damage to the recurrent laryngeal nerve, damage to parathyroid glands, and possible need for further surgery. Alternatives were offered and described. All questions were answered and the patient voiced understanding of the procedure and wishes to proceed.

## 2019-03-03 NOTE — Telephone Encounter (Signed)
   [facility] Pre-operative Risk Assessment    Request for surgical clearance:  1. What type of surgery is being performed? Total Thyroidectomy  2. When is this surgery scheduled? TBD  3. What type of clearance is required (medical clearance vs. Pharmacy clearance to hold med vs. Both)? BOTH  4. Are there any medications that need to be held prior to surgery and how long? N/A  5. Practice name and name of physician performing surgery? [facility]   6. What is your office phone number? [phone]   7.   What is your office fax number? [phone]  8.   Anesthesia type (None, local, MAC, general) ? General   Joann Barry 03/03/2019, 2:34 PM  _________________________________________________________________   (provider comments below)

## 2019-03-03 NOTE — H&P (View-Only) (Signed)
History of Present Illness Joann Ok MD; 03/03/2019 10:47 AM) The patient is a 73 year old female who presents with a thyroid nodule. Referred by:[name]  Chief Complaint: Thyroid nodule  Patient is a 73 year old female with a history of hypertension, aortic stenosis, who comes in with bilateral thyroid nodules. This was initially found physical exam. Patient underwent ultrasound and was found to have a 3.5 mid right-sided thyroid nodule and a 2.1 left inferior thyroid nodule. These both underwent FNA biopsy, With afirma. These are significant for right-sided being Bethesda 4 left side being the Bethesda 2. Afirma revealed a chance at 50% malignancy on the right side.  Patient denies any symptoms from the nodules currently at this time.  Patient is concerned about the nodules wants to proceed with total thyroidectomy. Patient understands that she will likely require thyroid supplementation life long.  Ultrasound findings: IMPRESSION: 1. 3.5 cm right mid thyroid nodule (nodule 1) and 2.1 cm left inferior thyroid nodule (nodule 2) meet criteria for fine-needle aspiration. 2. 0.6 cm left mid thyroid nodule meets criteria for 1 year follow-up ultrasound.   Past Surgical History (Tanisha A. Owens Shark, [place]; 03/03/2019 10:04 AM) No pertinent past surgical history   Diagnostic Studies History (Tanisha A. Owens Shark, [place]; 03/03/2019 10:04 AM) Colonoscopy  1-5 years ago Mammogram  within last year  Allergies (Tanisha A. Owens Shark, [place]; 03/03/2019 10:05 AM) Sulfa Antibiotics  Allergies Reconciled   Medication History (Tanisha A. Owens Shark, [place]; 03/03/2019 10:06 AM) Atenolol (50MG  Tablet, Oral) Active. Escitalopram Oxalate (10MG  Tablet, Oral) Active. Losartan Potassium (25MG  Tablet, Oral) Active. Rosuvastatin Calcium (20MG  Tablet, Oral) Active. Medications Reconciled  Social History (Tanisha A. Owens Shark, [place]; 03/03/2019 10:04 AM) Caffeine use  Coffee. No alcohol use  No drug  use  Tobacco use  Never smoker.  Family History (Tanisha A. Owens Shark, [place]; 03/03/2019 10:04 AM) Malignant Neoplasm Of Pancreas  Mother.  Pregnancy / Birth History (Tanisha A. Owens Shark, RMA; 03/03/2019 10:04 AM) Age at menarche  45 years. Age of menopause  29-50 Gravida  3 Maternal age  53-20 Para  3  Other Problems (Tanisha A. Owens Shark, [place]; 03/03/2019 10:04 AM) Arthritis  Heart murmur  High blood pressure  Hypercholesterolemia     Review of Systems Joann Ok MD; 03/03/2019 10:43 AM) General Not Present- Appetite Loss, Chills, Fatigue, Fever, Night Sweats, Weight Gain and Weight Loss. Skin Not Present- Change in Wart/Mole, Dryness, Hives, Jaundice, New Lesions, Non-Healing Wounds, Rash and Ulcer. HEENT Not Present- Earache, Hearing Loss, Hoarseness, Nose Bleed, Oral Ulcers, Ringing in the Ears, Seasonal Allergies, Sinus Pain, Sore Throat, Visual Disturbances, Wears glasses/contact lenses and Yellow Eyes. Respiratory Not Present- Bloody sputum, Chronic Cough, Difficulty Breathing, Snoring and Wheezing. Breast Not Present- Breast Mass, Breast Pain, Nipple Discharge and Skin Changes. Cardiovascular Not Present- Chest Pain, Difficulty Breathing Lying Down, Leg Cramps, Palpitations, Rapid Heart Rate, Shortness of Breath and Swelling of Extremities. Gastrointestinal Not Present- Abdominal Pain, Bloating, Bloody Stool, Change in Bowel Habits, Chronic diarrhea, Constipation, Difficulty Swallowing, Excessive gas, Gets full quickly at meals, Hemorrhoids, Indigestion, Nausea, Rectal Pain and Vomiting. Female Genitourinary Not Present- Frequency, Nocturia, Painful Urination, Pelvic Pain and Urgency. Musculoskeletal Not Present- Back Pain, Joint Pain, Joint Stiffness, Muscle Pain, Muscle Weakness and Swelling of Extremities. Neurological Not Present- Decreased Memory, Fainting, Headaches, Numbness, Seizures, Tingling, Tremor, Trouble walking and Weakness. Endocrine Not Present- Cold  Intolerance, Excessive Hunger, Hair Changes, Heat Intolerance, Hot flashes and New Diabetes. Hematology Not Present- Blood Thinners, Easy Bruising, Excessive bleeding, Gland problems, HIV and Persistent Infections.  All other systems negative  Vitals (Tanisha A. Brown RMA; 03/03/2019 10:05 AM) 03/03/2019 10:05 AM Weight: 165.4 lb Height: 64.5in Body Surface Area: 1.81 m Body Mass Index: 27.95 kg/m  Temp.: 98.70F  Pulse: 74 (Regular)  BP: 142/86 (Sitting, Left Arm, Standard)       Physical Exam Joann Ok MD; 03/03/2019 10:47 AM) The physical exam findings are as follows: Note:Constitutional: No acute distress, conversant, appears stated age  Eyes: Anicteric sclerae, moist conjunctiva, no lid lag  Neck: No thyromegaly, trachea midline, no cervical lymphadenopathy right mid thyroid nodule, left inferior thyroid nodule  Lungs: Clear to auscultation biilaterally, normal respiratory effot  Cardiovascular: regular rate & rhythm, no murmurs, no peripheal edema, pedal pulses 2+  GI: Soft, no masses or hepatosplenomegaly, non-tender to palpation  MSK: Normal gait, no clubbing cyanosis, edema  Skin: No rashes, palpation reveals normal skin turgor  Psychiatric: Appropriate judgment and insight, oriented to person, place, and time    Assessment & Plan Joann Ok MD; 03/03/2019 10:48 AM) THYROID NODULE (E04.1) Impression: Patient is a 73 year old female with aortic stenosis, hypertension, bilateral thyroid nodules concerning for follicular carcinoma. I discussed the patient the opportunity for partial thyroidectomy versus total thyroidectomy. She is currently in favor proceeding with total thyroidectomy to avoid any further future observational ultrasounds. Patient understands that she will require lifelong thyroid supplementation.  1. Will proceed to the operating room for total thyroidectomy. 2. All risks and benefits were discussed with the patient to  generally include: infection, bleeding, damage to the recurrent laryngeal nerve, damage to parathyroid glands, and possible need for further surgery. Alternatives were offered and described. All questions were answered and the patient voiced understanding of the procedure and wishes to proceed.

## 2019-03-03 NOTE — Telephone Encounter (Signed)
Dr. [name]  Can you please make cardiac clearance recommendations for this patient for total knee.  You last saw her 09/27/2018 and she was completely asymptomatic.  You have been watching her aortic stenosis with serial echocardiograms.  You wanted her to have repeat echo 02/2019 to assess for progression.  Mean gradient go from 05/27/2018 was 31 mmHg, now down to 19.5 on repeat echocardiogram from 02/28/2019.  Given improvement, which he be acceptable to proceed with thyroidectomy under general anesthesia?  Please send your recommendations to the preoperative pool   Thank you Sharee Pimple

## 2019-03-13 NOTE — Telephone Encounter (Signed)
Okay for surgery, but postop telemetry is recommended

## 2019-03-14 LAB — LIPID PANEL
Chol/HDL Ratio: 2.6 ratio (ref 0.0–4.4)
Cholesterol, Total: 125 mg/dL (ref 100–199)
HDL: 49 mg/dL (ref >39)
LDL Chol Calc (NIH): 59 mg/dL (ref 0–99)
Triglycerides: 86 mg/dL (ref 0–149)
VLDL Cholesterol Cal: 17 mg/dL (ref 5–40)

## 2019-03-14 LAB — COMPREHENSIVE METABOLIC PANEL
ALT: 11 IU/L (ref 0–32)
AST: 14 IU/L (ref 0–40)
Albumin/Globulin Ratio: 1.9 (ref 1.2–2.2)
Albumin: 4.3 g/dL (ref 3.7–4.7)
Alkaline Phosphatase: 63 IU/L (ref 39–117)
BUN/Creatinine Ratio: 18 (ref 12–28)
BUN: 16 mg/dL (ref 8–27)
Bilirubin Total: 0.6 mg/dL (ref 0.0–1.2)
CO2: 25 mmol/L (ref 20–29)
Calcium: 9.1 mg/dL (ref 8.7–10.3)
Chloride: 104 mmol/L (ref 96–106)
Creatinine, Ser: 0.88 mg/dL (ref 0.57–1.00)
GFR calc Af Amer: 75 mL/min/{1.73_m2} (ref >59)
GFR calc non Af Amer: 65 mL/min/{1.73_m2} (ref >59)
Globulin, Total: 2.3 g/dL (ref 1.5–4.5)
Glucose: 117 mg/dL — ABNORMAL HIGH (ref 65–99)
Potassium: 4.8 mmol/L (ref 3.5–5.2)
Sodium: 139 mmol/L (ref 134–144)
Total Protein: 6.6 g/dL (ref 6.0–8.5)

## 2019-03-14 LAB — LIPOPROTEIN A (LPA): Lipoprotein (a): 243.6 nmol/L — ABNORMAL HIGH (ref <75.0)

## 2019-03-14 NOTE — Telephone Encounter (Signed)
   Primary Cardiologist: Dr [name]  Chart reviewed as part of pre-operative protocol coverage. Given past medical history and time since last visit, based on ACC/AHA guidelines, Joann Barry would be at acceptable risk for the planned procedure without further cardiovascular testing.   Dr [name] recommends telemetry post op.   I will route this recommendation to the requesting party via Epic fax function and remove from pre-op pool.  Please call with questions.  Kerin Ransom, PA-C 03/14/2019, 8:48 AM

## 2019-03-22 ENCOUNTER — Encounter: Payer: Self-pay | Admitting: Physician Assistant

## 2019-03-22 ENCOUNTER — Other Ambulatory Visit: Payer: Self-pay

## 2019-03-22 ENCOUNTER — Ambulatory Visit: Payer: Medicare Other | Admitting: Physician Assistant

## 2019-03-22 VITALS — BP 158/88 | HR 60 | Ht 64.0 in | Wt 163.0 lb

## 2019-03-22 DIAGNOSIS — I712 Thoracic aortic aneurysm, without rupture, unspecified: Secondary | ICD-10-CM

## 2019-03-22 DIAGNOSIS — I10 Essential (primary) hypertension: Secondary | ICD-10-CM | POA: Diagnosis not present

## 2019-03-22 DIAGNOSIS — Z01810 Encounter for preprocedural cardiovascular examination: Secondary | ICD-10-CM | POA: Diagnosis not present

## 2019-03-22 DIAGNOSIS — I350 Nonrheumatic aortic (valve) stenosis: Secondary | ICD-10-CM | POA: Diagnosis not present

## 2019-03-22 NOTE — Patient Instructions (Addendum)
Medication Instructions:  Your physician recommends that you continue on your current medications as directed. Please refer to the Current Medication list given to you today. *If you need a refill on your cardiac medications before your next appointment, please call your pharmacy*  Lab Work: None  If you have labs (blood work) drawn today and your tests are completely normal, you will receive your results only by: Marland Kitchen MyChart Message (if you have MyChart) OR . A paper copy in the mail If you have any lab test that is abnormal or we need to change your treatment, we will call you to review the results.  Testing/Procedures: ORDER HAS BEEN PLACED PLEASE SCHEDULE THE TEST Your physician has requested that you have an echocardiogram. Echocardiography is a painless test that uses sound waves to create images of your heart. It provides your doctor with information about the size and shape of your heart and how well your heart's chambers and valves are working. This procedure takes approximately one hour. There are no restrictions for this procedure. TEST WILL BE COMPLETED AT [address] PLEASE SCHEDULE FOR 08/2019  Follow-Up: At [facility], you and your health needs are our priority.  As part of our continuing mission to provide you with exceptional heart care, we have created designated Provider Care Teams.  These Care Teams include your primary Cardiologist (physician) and Advanced Practice Providers (APPs -  Physician Assistants and Nurse Practitioners) who all work together to provide you with the care you need, when you need it.  Your next appointment:   6 month(s)  The format for your next appointment:   In Person  Provider:   [name], MD  Other Instructions [place]

## 2019-03-22 NOTE — Progress Notes (Addendum)
Cardiology Office Note:    Date:  03/24/2019   ID:  Joann Barry, DOB April 14, 1945, MRN [id number]  PCP:  [name], MD  Cardiologist:  [name], MD  Electrophysiologist:  None   Referring MD: [name], MD   Chief Complaint  Patient presents with  . Follow-up    seen for Dr. [name].     History of Present Illness:    Joann Barry is a 74 y.o. female with a hx of hypertension and aortic stenosis.  Echocardiogram obtained in September 2018 showed EF 55 to 123456, grade 1 diastolic dysfunction, aortic valve was moderately calcified and functionally bicuspid, mild MR, ascending aorta was mildly dilated to 4.5 cm.  Over the years, she has been followed via serial echocardiogram.  Most recent echocardiogram was obtained on 02/28/2019 that showed EF 60 to 65%, mild LVH, mild to moderate MR, mild to moderate aortic stenosis, ascending aorta mildly dilated at 41 mm.  Patient presents today for cardiology office visit.  She denies any recent chest pain or exertional shortness of breath.  Recent echocardiogram and lipid panel result has been reviewed with the patient.  Her lipoprotein a level was elevated, however HDL and LDL were both very well controlled.  I will continue on the current dose of Crestor.  She is due for the next echocardiogram around June 2021 and he can see Dr. [name] after that.  Blood pressure was elevated during the office visit, even on repeat blood pressure check by myself, blood pressure remained 160/80.  It sounds like her blood pressure is better at home, I suspect she might have a component of whitecoat syndrome.  I did not uptitrate her blood pressure medication today however recommended for her to keep blood pressure diary to bring to the next office visit.  I explained the correlation between uncontrolled high blood pressure and worsening thoracic aortic aneurysm, she is aware that we will need to control her blood pressure better at home.  Past Medical  History:  Diagnosis Date  . Hypertension   . Personal history of colonic polyps     Past Surgical History:  Procedure Laterality Date  . COLONOSCOPY    . PARTIAL HYSTERECTOMY  1980  . POLYPECTOMY      Current Medications: Current Meds  Medication Sig  . atenolol (TENORMIN) 50 MG tablet Take 50 mg by mouth daily.  . Cholecalciferol (VITAMIN D3) 2000 units TABS Take 2,000 Units by mouth daily.   Marland Kitchen losartan (COZAAR) 25 MG tablet Take 1 tablet by mouth twice daily (Patient taking differently: Take 25 mg by mouth 2 (two) times daily. )  . rosuvastatin (CRESTOR) 20 MG tablet Take 1 tablet (20 mg total) by mouth daily. (Patient taking differently: Take 20 mg by mouth every evening. )  . [DISCONTINUED] Multiple Vitamins-Minerals (MULTIVITAMIN PO) Take 1 capsule by mouth daily.     Allergies:   Sulfonamide derivatives   Social History   Socioeconomic History  . Marital status: Married    Spouse name: Not on file  . Number of children: Not on file  . Years of education: Not on file  . Highest education level: Not on file  Occupational History  . Not on file  Tobacco Use  . Smoking status: Never Smoker  . Smokeless tobacco: Never Used  Substance and Sexual Activity  . Alcohol use: No  . Drug use: No  . Sexual activity: Not on file  Other Topics Concern  . Not on file  Social History Narrative  . Not on file   Social Determinants of Health   Financial Resource Strain:   . Difficulty of Paying Living Expenses: Not on file  Food Insecurity:   . Worried About Charity fundraiser in the Last Year: Not on file  . Ran Out of Food in the Last Year: Not on file  Transportation Needs:   . Lack of Transportation (Medical): Not on file  . Lack of Transportation (Non-Medical): Not on file  Physical Activity:   . Days of Exercise per Week: Not on file  . Minutes of Exercise per Session: Not on file  Stress:   . Feeling of Stress : Not on file  Social Connections:   . Frequency  of Communication with Friends and Family: Not on file  . Frequency of Social Gatherings with Friends and Family: Not on file  . Attends Religious Services: Not on file  . Active Member of Clubs or Organizations: Not on file  . Attends Archivist Meetings: Not on file  . Marital Status: Not on file     Family History: The patient's family history includes Pancreatic cancer in her mother. There is no history of Colon cancer, Rectal cancer, or Stomach cancer.  ROS:   Please see the history of present illness.    All other systems reviewed and are negative.  EKGs/Labs/Other Studies Reviewed:    The following studies were reviewed today:  Echo 02/28/2019 IMPRESSIONS    1. Left ventricular ejection fraction, by visual estimation, is 60 to 65%. The left ventricle has normal function. There is mildly increased left ventricular hypertrophy.  2. Left ventricular diastolic parameters are indeterminate.  3. The left ventricle has no regional wall motion abnormalities.  4. Global right ventricle has normal systolic function.The right ventricular size is normal. No increase in right ventricular wall thickness.  5. Left atrial size was moderately dilated.  6. Right atrial size was mildly dilated.  7. The mitral valve is normal in structure. Mild to moderate mitral valve regurgitation.  8. The tricuspid valve is normal in structure. Tricuspid valve regurgitation is trivial.  9. The aortic valve is tricuspid. Aortic valve regurgitation is not visualized. Mild to moderate aortic valve stenosis. 10. The pulmonic valve was normal in structure. Pulmonic valve regurgitation is not visualized. 11. Aneurysm of the ascending aorta. 12. Aortic dilatation noted. 13. There is mild to moderate dilatation of the ascending aorta measuring 41 mm. 14. Normal pulmonary artery systolic pressure. 15. The atrial septum is grossly normal. 16. The average left ventricular global longitudinal strain is  -21.8 %.  EKG:  EKG is ordered today.  The ekg ordered today demonstrates normal sinus rhythm without significant ST-T wave changes  Recent Labs: 03/09/2019: ALT 11; BUN 16; Creatinine, Ser 0.88; Potassium 4.8; Sodium 139  Recent Lipid Panel    Component Value Date/Time   CHOL 125 03/09/2019 0837   TRIG 86 03/09/2019 0837   HDL 49 03/09/2019 0837   CHOLHDL 2.6 03/09/2019 0837   LDLCALC 59 03/09/2019 0837    Physical Exam:    VS:  BP (!) 158/88   Pulse 60   Ht 5\' 4"  (1.626 m)   Wt 163 lb (73.9 kg)   SpO2 96%   BMI 27.98 kg/m     Wt Readings from Last 3 Encounters:  03/22/19 163 lb (73.9 kg)  09/27/18 163 lb 3.2 oz (74 kg)  12/14/17 163 lb 6.4 oz (74.1 kg)     GEN:  Well nourished, well developed in no acute distress HEENT: Normal NECK: No JVD; No carotid bruits LYMPHATICS: No lymphadenopathy CARDIAC: RRR, no murmurs, rubs, gallops RESPIRATORY:  Clear to auscultation without rales, wheezing or rhonchi  ABDOMEN: Soft, non-tender, non-distended MUSCULOSKELETAL:  No edema; No deformity  SKIN: Warm and dry NEUROLOGIC:  Alert and oriented x 3 PSYCHIATRIC:  Normal affect   ASSESSMENT:    1. Aortic valve stenosis, etiology of cardiac valve disease unspecified   2. Essential hypertension   3. Thoracic aortic aneurysm without rupture ([place])   4. Preop cardiovascular exam    PLAN:    In order of problems listed above:  1. Aortic stenosis: She is due for the next echocardiogram around June 2021.  2. Hypertension: Blood pressure elevated today however normally it is well controlled at home.  I suspect she has a component of whitecoat syndrome.  I recommended for her to keep a blood pressure diary at home.  Given her history of thoracic aortic aneurysm, I recommended systolic blood pressure goal of 110-120s.  3. Thoracic aortic aneurysm: Stable on last echocardiogram in December  4. Preoperative clearance: She has upcoming total thyroidectomy by [name]. Overall she is  a low risk patient for the intended procedure.   Medication Adjustments/Labs and Tests Ordered: Current medicines are reviewed at length with the patient today.  Concerns regarding medicines are outlined above.  Orders Placed This Encounter  Procedures  . ECHOCARDIOGRAM COMPLETE   No orders of the defined types were placed in this encounter.   Patient Instructions  Medication Instructions:  Your physician recommends that you continue on your current medications as directed. Please refer to the Current Medication list given to you today. *If you need a refill on your cardiac medications before your next appointment, please call your pharmacy*  Lab Work: None  If you have labs (blood work) drawn today and your tests are completely normal, you will receive your results only by: Marland Kitchen MyChart Message (if you have MyChart) OR . A paper copy in the mail If you have any lab test that is abnormal or we need to change your treatment, we will call you to review the results.  Testing/Procedures: ORDER HAS BEEN PLACED PLEASE SCHEDULE THE TEST Your physician has requested that you have an echocardiogram. Echocardiography is a painless test that uses sound waves to create images of your heart. It provides your doctor with information about the size and shape of your heart and how well your heart's chambers and valves are working. This procedure takes approximately one hour. There are no restrictions for this procedure. TEST WILL BE COMPLETED AT [address] PLEASE SCHEDULE FOR 08/2019  Follow-Up: At [facility], you and your health needs are our priority.  As part of our continuing mission to provide you with exceptional heart care, we have created designated Provider Care Teams.  These Care Teams include your primary Cardiologist (physician) and Advanced Practice Providers (APPs -  Physician Assistants and Nurse Practitioners) who all work together to provide you with the care you  need, when you need it.  Your next appointment:   6 month(s)  The format for your next appointment:   In Person  Provider:   [name], MD  Other Instructions [place], [name], [place]  03/24/2019 7:52 PM    [facility]

## 2019-03-24 ENCOUNTER — Encounter: Payer: Self-pay | Admitting: Physician Assistant

## 2019-03-25 ENCOUNTER — Other Ambulatory Visit (INDEPENDENT_AMBULATORY_CARE_PROVIDER_SITE_OTHER): Payer: Medicare Other

## 2019-03-25 DIAGNOSIS — I712 Thoracic aortic aneurysm, without rupture, unspecified: Secondary | ICD-10-CM

## 2019-03-25 DIAGNOSIS — I350 Nonrheumatic aortic (valve) stenosis: Secondary | ICD-10-CM | POA: Diagnosis not present

## 2019-03-25 DIAGNOSIS — I10 Essential (primary) hypertension: Secondary | ICD-10-CM | POA: Diagnosis not present

## 2019-03-25 DIAGNOSIS — Z01810 Encounter for preprocedural cardiovascular examination: Secondary | ICD-10-CM | POA: Diagnosis not present

## 2019-03-26 ENCOUNTER — Other Ambulatory Visit (HOSPITAL_COMMUNITY)
Admission: RE | Admit: 2019-03-26 | Discharge: 2019-03-26 | Disposition: A | Discharge status: Home / Self Care | Payer: Medicare Other | Source: Ambulatory Visit | Attending: General Surgery | Admitting: General Surgery

## 2019-03-26 DIAGNOSIS — Z01812 Encounter for preprocedural laboratory examination: Secondary | ICD-10-CM | POA: Insufficient documentation

## 2019-03-26 DIAGNOSIS — Z20822 Contact with and (suspected) exposure to covid-19: Secondary | ICD-10-CM | POA: Diagnosis not present

## 2019-03-27 LAB — NOVEL CORONAVIRUS, NAA (HOSP ORDER, SEND-OUT TO REF LAB; TAT 18-24 HRS): SARS-CoV-2, NAA: NOT DETECTED

## 2019-03-29 ENCOUNTER — Other Ambulatory Visit: Payer: Self-pay

## 2019-03-29 ENCOUNTER — Encounter (HOSPITAL_COMMUNITY): Payer: Self-pay | Admitting: General Surgery

## 2019-03-29 NOTE — Progress Notes (Signed)
Anesthesia Chart Review: Same day workup  Follows with cardiology for hx of hypertension and aortic stenosis.  Echo 9/18 showed EF 55 to 123456, grade 1 diastolic dysfunction, aortic valve was moderately calcified and functionally bicuspid, mild MR, ascending aorta was mildly dilated to 4.5 cm.  Over the years, she has been followed via serial echocardiogram.  Most recent echo 02/28/2019 showed EF 60 to 65%, mild LVH, mild to moderate MR, mild to moderate aortic stenosis, ascending aorta mildly dilated at 41 mm. Per cardiology she is to have repeat echo around 6/21. She was seen by [name], PA on 03/22/19 and cleared for surgery, "She has upcoming total thyroidectomy by [name]. Overall she is a low risk patient for the intended procedure." Dr. [name] recommended telemetry postop.   Will need DOS labs and eval.   EKG 03/21/18: NSR. Rate 60.  TTE 02/28/19:  1. Left ventricular ejection fraction, by visual estimation, is 60 to 65%. The left ventricle has normal function. There is mildly increased left ventricular hypertrophy.  2. Left ventricular diastolic parameters are indeterminate.  3. The left ventricle has no regional wall motion abnormalities.  4. Global right ventricle has normal systolic function.The right ventricular size is normal. No increase in right ventricular wall thickness.  5. Left atrial size was moderately dilated.  6. Right atrial size was mildly dilated.  7. The mitral valve is normal in structure. Mild to moderate mitral valve regurgitation.  8. The tricuspid valve is normal in structure. Tricuspid valve regurgitation is trivial.  9. The aortic valve is tricuspid. Aortic valve regurgitation is not visualized. Mild to moderate aortic valve stenosis. 10. The pulmonic valve was normal in structure. Pulmonic valve regurgitation is not visualized. 11. Aneurysm of the ascending aorta. 12. Aortic dilatation noted. 13. There is mild to moderate dilatation of the ascending aorta  measuring 41 mm. 14. Normal pulmonary artery systolic pressure. 15. The atrial septum is grossly normal. 16. The average left ventricular global longitudinal strain is -21.8 %.   Wynonia Musty [facility] Short Stay Center/Anesthesiology Phone [phone] 03/29/2019 2:15 PM

## 2019-03-29 NOTE — Progress Notes (Signed)
Pt denies SOB and chest pain. Pt is under the care of Dr. [name], Cardiology and [name], PCP. Pt denies having a stress test and cardiac cath. Pt denies having a chest x ray in the last 30 days. Pt denies recent labs. Pt made aware to stop taking Aspirin (unless otherwise advised by surgeon), vitamins, fish oil and herbal medications. Do not take any NSAIDs ie: Ibuprofen, Advil, Naproxen (Aleve), Motrin, BC and Goody Powder. Pt made aware to quarantine. Pt verbalized understanding of all pre-op instructions. PA, Anesthesiology, made aware of cardiac clearance note in Epic.

## 2019-03-29 NOTE — Anesthesia Preprocedure Evaluation (Addendum)
Anesthesia Evaluation  Patient identified by MRN, date of birth, ID band Patient awake    Reviewed: Allergy & Precautions, NPO status , Patient's Chart, lab work & pertinent test results, reviewed documented beta blocker date and time   Airway Mallampati: II  TM Distance: >3 FB Neck ROM: Full    Dental no notable dental hx.    Pulmonary neg pulmonary ROS,    Pulmonary exam normal breath sounds clear to auscultation       Cardiovascular hypertension, Pt. on medications and Pt. on home beta blockers Normal cardiovascular exam+ Valvular Problems/Murmurs AS  Rhythm:Regular Rate:Normal     Neuro/Psych Anxiety negative neurological ROS  negative psych ROS   GI/Hepatic negative GI ROS, Neg liver ROS,   Endo/Other  negative endocrine ROS  Renal/GU negative Renal ROS  negative genitourinary   Musculoskeletal  (+) Arthritis , Osteoarthritis,    Abdominal   Peds negative pediatric ROS (+)  Hematology negative hematology ROS (+)   Anesthesia Other Findings   Reproductive/Obstetrics negative OB ROS                            Anesthesia Physical Anesthesia Plan  ASA: III  Anesthesia Plan: General   Post-op Pain Management:    Induction: Intravenous  PONV Risk Score and Plan: 3 and Ondansetron, Dexamethasone, Midazolam and Treatment may vary due to age or medical condition  Airway Management Planned: Oral ETT  Additional Equipment:   Intra-op Plan:   Post-operative Plan: Extubation in OR  Informed Consent: I have reviewed the patients History and Physical, chart, labs and discussed the procedure including the risks, benefits and alternatives for the proposed anesthesia with the patient or authorized representative who has indicated his/her understanding and acceptance.     Dental advisory given  Plan Discussed with: CRNA  Anesthesia Plan Comments: (Follows with cardiology for hx of  hypertension and aortic stenosis.  Echo 9/18 showed EF 55 to 123456, grade 1 diastolic dysfunction, aortic valve was moderately calcified and functionally bicuspid, mild MR, ascending aorta was mildly dilated to 4.5 cm.  Over the years, she has been followed via serial echocardiogram.  Most recent echo 02/28/2019 showed EF 60 to 65%, mild LVH, mild to moderate MR, mild to moderate aortic stenosis, ascending aorta mildly dilated at 41 mm. Per cardiology she is to have repeat echo around 6/21. She was seen by [name], PA on 03/22/19 and cleared for surgery, "She has upcoming total thyroidectomy by [name]. Overall she is a low risk patient for the intended procedure." Dr. [name] recommended telemetry postop.   Will need DOS labs and eval.   EKG 03/21/18: NSR. Rate 60.  TTE 02/28/19:  1. Left ventricular ejection fraction, by visual estimation, is 60 to 65%. The left ventricle has normal function. There is mildly increased left ventricular hypertrophy.  2. Left ventricular diastolic parameters are indeterminate.  3. The left ventricle has no regional wall motion abnormalities.  4. Global right ventricle has normal systolic function.The right ventricular size is normal. No increase in right ventricular wall thickness.  5. Left atrial size was moderately dilated.  6. Right atrial size was mildly dilated.  7. The mitral valve is normal in structure. Mild to moderate mitral valve regurgitation.  8. The tricuspid valve is normal in structure. Tricuspid valve regurgitation is trivial.  9. The aortic valve is tricuspid. Aortic valve regurgitation is not visualized. Mild to moderate aortic valve stenosis. 10. The pulmonic  valve was normal in structure. Pulmonic valve regurgitation is not visualized. 11. Aneurysm of the ascending aorta. 12. Aortic dilatation noted. 13. There is mild to moderate dilatation of the ascending aorta measuring 41 mm. 14. Normal pulmonary artery systolic pressure. 15. The atrial  septum is grossly normal. 16. The average left ventricular global longitudinal strain is -21.8 %. )       Anesthesia Quick Evaluation

## 2019-03-30 ENCOUNTER — Ambulatory Visit (HOSPITAL_COMMUNITY): Payer: Medicare Other

## 2019-03-30 ENCOUNTER — Other Ambulatory Visit: Payer: Self-pay

## 2019-03-30 ENCOUNTER — Observation Stay (HOSPITAL_COMMUNITY)
Admission: AD | Admit: 2019-03-30 | Discharge: 2019-03-31 | Disposition: A | Discharge status: Home / Self Care | Payer: Medicare Other | Attending: General Surgery | Admitting: General Surgery

## 2019-03-30 ENCOUNTER — Ambulatory Visit (HOSPITAL_COMMUNITY): Payer: Medicare Other | Admitting: Physician Assistant

## 2019-03-30 ENCOUNTER — Encounter (HOSPITAL_COMMUNITY)
Admission: AD | Disposition: A | Discharge status: Home / Self Care | Payer: Self-pay | Source: Home / Self Care | Attending: General Surgery

## 2019-03-30 ENCOUNTER — Encounter (HOSPITAL_COMMUNITY): Payer: Self-pay | Admitting: General Surgery

## 2019-03-30 DIAGNOSIS — C73 Malignant neoplasm of thyroid gland: Secondary | ICD-10-CM | POA: Diagnosis not present

## 2019-03-30 DIAGNOSIS — E890 Postprocedural hypothyroidism: Secondary | ICD-10-CM

## 2019-03-30 DIAGNOSIS — I350 Nonrheumatic aortic (valve) stenosis: Secondary | ICD-10-CM | POA: Diagnosis not present

## 2019-03-30 DIAGNOSIS — I10 Essential (primary) hypertension: Secondary | ICD-10-CM | POA: Insufficient documentation

## 2019-03-30 DIAGNOSIS — Z01818 Encounter for other preprocedural examination: Secondary | ICD-10-CM

## 2019-03-30 DIAGNOSIS — E7800 Pure hypercholesterolemia, unspecified: Secondary | ICD-10-CM | POA: Diagnosis not present

## 2019-03-30 DIAGNOSIS — F419 Anxiety disorder, unspecified: Secondary | ICD-10-CM | POA: Diagnosis not present

## 2019-03-30 DIAGNOSIS — E041 Nontoxic single thyroid nodule: Secondary | ICD-10-CM | POA: Diagnosis present

## 2019-03-30 DIAGNOSIS — M1990 Unspecified osteoarthritis, unspecified site: Secondary | ICD-10-CM | POA: Diagnosis not present

## 2019-03-30 DIAGNOSIS — Z9889 Other specified postprocedural states: Secondary | ICD-10-CM

## 2019-03-30 DIAGNOSIS — Z9089 Acquired absence of other organs: Secondary | ICD-10-CM

## 2019-03-30 HISTORY — DX: Pure hypercholesterolemia, unspecified: E78.00

## 2019-03-30 HISTORY — DX: Anxiety disorder, unspecified: F41.9

## 2019-03-30 HISTORY — DX: Cardiac murmur, unspecified: R01.1

## 2019-03-30 HISTORY — DX: Nonrheumatic aortic (valve) stenosis: I35.0

## 2019-03-30 HISTORY — PX: THYROIDECTOMY: SHX17

## 2019-03-30 HISTORY — DX: Nontoxic single thyroid nodule: E04.1

## 2019-03-30 HISTORY — DX: Acquired absence of other organs: Z90.89

## 2019-03-30 HISTORY — DX: Postprocedural hypothyroidism: E89.0

## 2019-03-30 HISTORY — DX: Unspecified osteoarthritis, unspecified site: M19.90

## 2019-03-30 LAB — CBC
HCT: 36 % (ref 36.0–46.0)
Hemoglobin: 12 g/dL (ref 12.0–15.0)
MCH: 29.8 pg (ref 26.0–34.0)
MCHC: 33.3 g/dL (ref 30.0–36.0)
MCV: 89.3 fL (ref 80.0–100.0)
Platelets: 247 10*3/uL (ref 150–400)
RBC: 4.03 MIL/uL (ref 3.87–5.11)
RDW: 12.9 % (ref 11.5–15.5)
WBC: 6.6 10*3/uL (ref 4.0–10.5)
nRBC: 0 % (ref 0.0–0.2)

## 2019-03-30 LAB — BASIC METABOLIC PANEL
Anion gap: 9 (ref 5–15)
Anion gap: 9 (ref 5–15)
BUN: 14 mg/dL (ref 8–23)
BUN: 13 mg/dL (ref 8–23)
CO2: 25 mmol/L (ref 22–32)
CO2: 25 mmol/L (ref 22–32)
Calcium: 9 mg/dL (ref 8.9–10.3)
Calcium: 8.8 mg/dL — ABNORMAL LOW (ref 8.9–10.3)
Chloride: 106 mmol/L (ref 98–111)
Chloride: 103 mmol/L (ref 98–111)
Creatinine, Ser: 0.8 mg/dL (ref 0.44–1.00)
Creatinine, Ser: 0.76 mg/dL (ref 0.44–1.00)
GFR calc Af Amer: >60 mL/min (ref >60)
GFR calc Af Amer: >60 mL/min (ref >60)
GFR calc non Af Amer: >60 mL/min (ref >60)
GFR calc non Af Amer: >60 mL/min (ref >60)
Glucose, Bld: 120 mg/dL — ABNORMAL HIGH (ref 70–99)
Glucose, Bld: 150 mg/dL — ABNORMAL HIGH (ref 70–99)
Potassium: 4.2 mmol/L (ref 3.5–5.1)
Potassium: 4 mmol/L (ref 3.5–5.1)
Sodium: 140 mmol/L (ref 135–145)
Sodium: 137 mmol/L (ref 135–145)

## 2019-03-30 SURGERY — THYROIDECTOMY
Anesthesia: General

## 2019-03-30 MED ORDER — HYDROMORPHONE HCL 1 MG/ML IJ SOLN: 1.0000 mg | INTRAMUSCULAR | Status: DC | PRN

## 2019-03-30 MED ORDER — CEFAZOLIN SODIUM-DEXTROSE 2-4 GM/100ML-% IV SOLN
2.0000 g | INTRAVENOUS | Status: AC
  Administered 2019-03-30: 2 g via INTRAVENOUS
  Filled 2019-03-30: qty 100

## 2019-03-30 MED ORDER — ATENOLOL 50 MG PO TABS: 50.0000 mg | ORAL_TABLET | Freq: Every day | ORAL | Status: DC

## 2019-03-30 MED ORDER — CHLORHEXIDINE GLUCONATE CLOTH 2 % EX PADS: 6.0000 | MEDICATED_PAD | Freq: Once | CUTANEOUS | Status: DC

## 2019-03-30 MED ORDER — SUGAMMADEX SODIUM 200 MG/2ML IV SOLN
INTRAVENOUS | Status: DC | PRN
  Administered 2019-03-30: 200 mg via INTRAVENOUS

## 2019-03-30 MED ORDER — FENTANYL CITRATE (PF) 250 MCG/5ML IJ SOLN
INTRAMUSCULAR | Status: AC
  Filled 2019-03-30: qty 5

## 2019-03-30 MED ORDER — DEXAMETHASONE SODIUM PHOSPHATE 10 MG/ML IJ SOLN
INTRAMUSCULAR | Status: DC | PRN
  Administered 2019-03-30: 10 mg via INTRAVENOUS

## 2019-03-30 MED ORDER — DEXTROSE-NACL 5-0.9 % IV SOLN: INTRAVENOUS | Status: DC

## 2019-03-30 MED ORDER — ACETAMINOPHEN 500 MG PO TABS
1000.0000 mg | ORAL_TABLET | ORAL | Status: AC
  Administered 2019-03-30: 08:00:00 1000 mg via ORAL
  Filled 2019-03-30: qty 2

## 2019-03-30 MED ORDER — OXYCODONE HCL 5 MG PO TABS: 5.0000 mg | ORAL_TABLET | Freq: Once | ORAL | Status: DC | PRN

## 2019-03-30 MED ORDER — MAGNESIUM OXIDE 400 (241.3 MG) MG PO TABS
400.0000 mg | ORAL_TABLET | Freq: Two times a day (BID) | ORAL | Status: DC
  Administered 2019-03-30 (×2): 400 mg via ORAL
  Filled 2019-03-30 (×2): qty 1

## 2019-03-30 MED ORDER — HYDROCODONE-ACETAMINOPHEN 5-325 MG PO TABS: 1.0000 | ORAL_TABLET | ORAL | Status: DC | PRN

## 2019-03-30 MED ORDER — 0.9 % SODIUM CHLORIDE (POUR BTL) OPTIME
TOPICAL | Status: DC | PRN
  Administered 2019-03-30: 1000 mL

## 2019-03-30 MED ORDER — LIDOCAINE-EPINEPHRINE 1 %-1:100000 IJ SOLN
INTRAMUSCULAR | Status: AC
  Filled 2019-03-30: qty 1

## 2019-03-30 MED ORDER — HYDROMORPHONE HCL 1 MG/ML IJ SOLN: 0.2500 mg | INTRAMUSCULAR | Status: DC | PRN

## 2019-03-30 MED ORDER — PROPOFOL 10 MG/ML IV BOLUS
INTRAVENOUS | Status: AC
  Filled 2019-03-30: qty 20

## 2019-03-30 MED ORDER — ROCURONIUM BROMIDE 50 MG/5ML IV SOSY
PREFILLED_SYRINGE | INTRAVENOUS | Status: DC | PRN
  Administered 2019-03-30: 70 mg via INTRAVENOUS

## 2019-03-30 MED ORDER — CALCIUM CARBONATE-VITAMIN D 500-200 MG-UNIT PO TABS
2.0000 | ORAL_TABLET | Freq: Three times a day (TID) | ORAL | Status: DC
  Administered 2019-03-30 (×2): 2 via ORAL
  Filled 2019-03-30 (×2): qty 2

## 2019-03-30 MED ORDER — ONDANSETRON HCL 4 MG/2ML IJ SOLN
INTRAMUSCULAR | Status: AC
  Filled 2019-03-30: qty 2

## 2019-03-30 MED ORDER — TRAMADOL HCL 50 MG PO TABS: 50.0000 mg | ORAL_TABLET | Freq: Four times a day (QID) | ORAL | Status: DC | PRN

## 2019-03-30 MED ORDER — EPHEDRINE SULFATE 50 MG/ML IJ SOLN
INTRAMUSCULAR | Status: DC | PRN
  Administered 2019-03-30: 5 mg via INTRAVENOUS
  Administered 2019-03-30 (×2): 10 mg via INTRAVENOUS

## 2019-03-30 MED ORDER — ONDANSETRON HCL 4 MG/2ML IJ SOLN: 4.0000 mg | Freq: Four times a day (QID) | INTRAMUSCULAR | Status: DC | PRN

## 2019-03-30 MED ORDER — HEMOSTATIC AGENTS (NO CHARGE) OPTIME
TOPICAL | Status: DC | PRN
  Administered 2019-03-30: 1 via TOPICAL

## 2019-03-30 MED ORDER — ONDANSETRON HCL 4 MG/2ML IJ SOLN
INTRAMUSCULAR | Status: DC | PRN
  Administered 2019-03-30: 4 mg via INTRAVENOUS

## 2019-03-30 MED ORDER — ROCURONIUM BROMIDE 10 MG/ML (PF) SYRINGE
PREFILLED_SYRINGE | INTRAVENOUS | Status: AC
  Filled 2019-03-30: qty 10

## 2019-03-30 MED ORDER — DEXAMETHASONE SODIUM PHOSPHATE 10 MG/ML IJ SOLN
INTRAMUSCULAR | Status: AC
  Filled 2019-03-30: qty 1

## 2019-03-30 MED ORDER — HYDRALAZINE HCL 20 MG/ML IJ SOLN: 10.0000 mg | INTRAMUSCULAR | Status: DC | PRN

## 2019-03-30 MED ORDER — PHENYLEPHRINE HCL (PRESSORS) 10 MG/ML IV SOLN
INTRAVENOUS | Status: DC | PRN
  Administered 2019-03-30: 40 ug via INTRAVENOUS
  Administered 2019-03-30: 80 ug via INTRAVENOUS

## 2019-03-30 MED ORDER — PROPOFOL 10 MG/ML IV BOLUS
INTRAVENOUS | Status: DC | PRN
  Administered 2019-03-30: 120 mg via INTRAVENOUS

## 2019-03-30 MED ORDER — LIDOCAINE 2% (20 MG/ML) 5 ML SYRINGE
INTRAMUSCULAR | Status: DC | PRN
  Administered 2019-03-30: 80 mg via INTRAVENOUS

## 2019-03-30 MED ORDER — ONDANSETRON 4 MG PO TBDP: 4.0000 mg | ORAL_TABLET | Freq: Four times a day (QID) | ORAL | Status: DC | PRN

## 2019-03-30 MED ORDER — BUPIVACAINE HCL 0.25 % IJ SOLN
INTRAMUSCULAR | Status: DC | PRN
  Administered 2019-03-30: 10 mL

## 2019-03-30 MED ORDER — OXYCODONE HCL 5 MG/5ML PO SOLN: 5.0000 mg | Freq: Once | ORAL | Status: DC | PRN

## 2019-03-30 MED ORDER — FENTANYL CITRATE (PF) 100 MCG/2ML IJ SOLN
INTRAMUSCULAR | Status: DC | PRN
  Administered 2019-03-30: 100 ug via INTRAVENOUS
  Administered 2019-03-30: 50 ug via INTRAVENOUS

## 2019-03-30 MED ORDER — BUPIVACAINE HCL (PF) 0.25 % IJ SOLN
INTRAMUSCULAR | Status: AC
  Filled 2019-03-30: qty 30

## 2019-03-30 MED ORDER — ESCITALOPRAM OXALATE 10 MG PO TABS
10.0000 mg | ORAL_TABLET | Freq: Every day | ORAL | Status: DC
  Administered 2019-03-30: 10 mg via ORAL
  Filled 2019-03-30: qty 1

## 2019-03-30 MED ORDER — LACTATED RINGERS IV SOLN: INTRAVENOUS | Status: DC

## 2019-03-30 MED ORDER — LIDOCAINE 2% (20 MG/ML) 5 ML SYRINGE
INTRAMUSCULAR | Status: AC
  Filled 2019-03-30: qty 5

## 2019-03-30 MED ORDER — PROMETHAZINE HCL 25 MG/ML IJ SOLN: 6.2500 mg | INTRAMUSCULAR | Status: DC | PRN

## 2019-03-30 MED ORDER — LOSARTAN POTASSIUM 25 MG PO TABS
25.0000 mg | ORAL_TABLET | Freq: Two times a day (BID) | ORAL | Status: DC
  Administered 2019-03-30: 20:00:00 25 mg via ORAL
  Filled 2019-03-30: qty 1

## 2019-03-30 SURGICAL SUPPLY — 44 items
ATTRACTOMAT 16X20 MAGNETIC DRP (DRAPES) ×2 IMPLANT
BLADE CLIPPER SURG (BLADE) IMPLANT
CANISTER SUCT 3000ML PPV (MISCELLANEOUS) ×2 IMPLANT
CHLORAPREP W/TINT 26 (MISCELLANEOUS) ×2 IMPLANT
CLIP VESOCCLUDE MED 24/CT (CLIP) ×4 IMPLANT
CLIP VESOCCLUDE SM WIDE 24/CT (CLIP) ×4 IMPLANT
CONT SPEC 4OZ CLIKSEAL STRL BL (MISCELLANEOUS) ×2 IMPLANT
COVER SURGICAL LIGHT HANDLE (MISCELLANEOUS) ×2 IMPLANT
COVER WAND RF STERILE (DRAPES) ×2 IMPLANT
DERMABOND ADVANCED (GAUZE/BANDAGES/DRESSINGS) ×1
DERMABOND ADVANCED .7 DNX12 (GAUZE/BANDAGES/DRESSINGS) ×1 IMPLANT
DRAPE LAPAROTOMY 100X72 PEDS (DRAPES) ×2 IMPLANT
ELECT COATED BLADE 2.86 ST (ELECTRODE) ×2 IMPLANT
ELECT REM PT RETURN 9FT ADLT (ELECTROSURGICAL) ×2
ELECTRODE REM PT RTRN 9FT ADLT (ELECTROSURGICAL) ×1 IMPLANT
GAUZE 4X4 16PLY RFD (DISPOSABLE) ×4 IMPLANT
GAUZE SPONGE 4X4 12PLY STRL (GAUZE/BANDAGES/DRESSINGS) ×2 IMPLANT
GLOVE BIO SURGEON STRL SZ7.5 (GLOVE) ×4 IMPLANT
GOWN STRL REUS W/ TWL LRG LVL3 (GOWN DISPOSABLE) ×2 IMPLANT
GOWN STRL REUS W/ TWL XL LVL3 (GOWN DISPOSABLE) ×1 IMPLANT
GOWN STRL REUS W/TWL LRG LVL3 (GOWN DISPOSABLE) ×2
GOWN STRL REUS W/TWL XL LVL3 (GOWN DISPOSABLE) ×1
HEMOSTAT SURGICEL 2X4 FIBR (HEMOSTASIS) ×2 IMPLANT
ILLUMINATOR WAVEGUIDE N/F (MISCELLANEOUS) ×2 IMPLANT
KIT BASIN OR (CUSTOM PROCEDURE TRAY) ×2 IMPLANT
KIT TURNOVER KIT B (KITS) ×2 IMPLANT
NEEDLE HYPO 25GX1X1/2 BEV (NEEDLE) ×2 IMPLANT
NS IRRIG 1000ML POUR BTL (IV SOLUTION) ×2 IMPLANT
PACK GENERAL/GYN (CUSTOM PROCEDURE TRAY) ×2 IMPLANT
PAD ARMBOARD 7.5X6 YLW CONV (MISCELLANEOUS) ×2 IMPLANT
PENCIL SMOKE EVACUATOR (MISCELLANEOUS) ×2 IMPLANT
POSITIONER HEAD DONUT 9IN (MISCELLANEOUS) ×2 IMPLANT
SHEARS HARMONIC 9CM CVD (BLADE) ×2 IMPLANT
SPECIMEN JAR MEDIUM (MISCELLANEOUS) ×2 IMPLANT
SPONGE INTESTINAL PEANUT (DISPOSABLE) ×2 IMPLANT
SUT MNCRL AB 4-0 PS2 18 (SUTURE) ×2 IMPLANT
SUT SILK 2 0 (SUTURE) ×1
SUT SILK 2-0 18XBRD TIE 12 (SUTURE) ×1 IMPLANT
SUT SILK 3 0 (SUTURE) ×1
SUT SILK 3-0 18XBRD TIE 12 (SUTURE) ×1 IMPLANT
SUT VIC AB 3-0 SH 18 (SUTURE) ×4 IMPLANT
SYR CONTROL 10ML LL (SYRINGE) ×2 IMPLANT
TOWEL GREEN STERILE (TOWEL DISPOSABLE) ×2 IMPLANT
TOWEL GREEN STERILE FF (TOWEL DISPOSABLE) ×2 IMPLANT

## 2019-03-30 NOTE — Anesthesia Procedure Notes (Signed)
Procedure Name: Intubation Date/Time: 03/30/2019 10:28 AM Performed by: Babs Bertin, CRNA Pre-anesthesia Checklist: Patient identified, Emergency Drugs available, Suction available and Patient being monitored Patient Re-evaluated:Patient Re-evaluated prior to induction Oxygen Delivery Method: Circle System Utilized Preoxygenation: Pre-oxygenation with 100% oxygen Induction Type: IV induction Ventilation: Mask ventilation without difficulty Laryngoscope Size: Mac and 3 Grade View: Grade III Tube type: Oral Tube size: 7.0 mm Number of attempts: 1 Airway Equipment and Method: Stylet and Oral airway Placement Confirmation: ETT inserted through vocal cords under direct vision,  positive ETCO2 and breath sounds checked- equal and bilateral Secured at: 21 cm Tube secured with: Tape Dental Injury: Teeth and Oropharynx as per pre-operative assessment

## 2019-03-30 NOTE — Anesthesia Postprocedure Evaluation (Signed)
Anesthesia Post Note  Patient: Joann Barry  Procedure(s) Performed: TOTAL THYROIDECTOMY (N/A )     Patient location during evaluation: PACU Anesthesia Type: General Level of consciousness: awake and alert Pain management: pain level controlled Vital Signs Assessment: post-procedure vital signs reviewed and stable Respiratory status: spontaneous breathing, nonlabored ventilation and respiratory function stable Cardiovascular status: blood pressure returned to baseline and stable Postop Assessment: no apparent nausea or vomiting Anesthetic complications: no    Last Vitals:  Vitals:   03/30/19 1210 03/30/19 1215  BP:    Pulse:  72  Resp: (!) 21 (!) 23  Temp:    SpO2:  (!) 89%    Last Pain:  Vitals:   03/30/19 1208  PainSc: 0-No pain                 Lynda Rainwater

## 2019-03-30 NOTE — Progress Notes (Signed)
Patient arrived to 6N12, AOx4, VSS, no complaints of pain, surgical site assessed, clean, dry, intact with skin glue. Oriented to room, call bell, use of bed controls. Will continue to monitor.

## 2019-03-30 NOTE — Interval H&P Note (Signed)
History and Physical Interval Note:  03/30/2019 9:49 AM  Joann Barry  has presented today for surgery, with the diagnosis of THYROID NODULE.  The various methods of treatment have been discussed with the patient and family. After consideration of risks, benefits and other options for treatment, the patient has consented to  Procedure(s): TOTAL THYROIDECTOMY (N/A) as a surgical intervention.  The patient's history has been reviewed, patient examined, no change in status, stable for surgery.  I have reviewed the patient's chart and labs.  Questions were answered to the patient's satisfaction.     [name]

## 2019-03-30 NOTE — Transfer of Care (Signed)
Immediate Anesthesia Transfer of Care Note  Patient: Joann Barry  Procedure(s) Performed: TOTAL THYROIDECTOMY (N/A )  Patient Location: PACU  Anesthesia Type:General  Level of Consciousness: awake, alert  and oriented  Airway & Oxygen Therapy: Patient Spontanous Breathing  Post-op Assessment: Report given to RN and Post -op Vital signs reviewed and stable  Post vital signs: Reviewed and stable  Last Vitals:  Vitals Value Taken Time  BP 154/116 03/30/19 1208  Temp    Pulse 72 03/30/19 1215  Resp 23 03/30/19 1215  SpO2 89 % 03/30/19 1215  Vitals shown include unvalidated device data.  Last Pain:  Vitals:   03/30/19 0802  PainSc: 0-No pain      Patients Stated Pain Goal: 3 ([id number])  Complications: No apparent anesthesia complications

## 2019-03-30 NOTE — Op Note (Signed)
03/30/2019  11:55 AM  PATIENT:  Joann Barry  74 y.o. female  PRE-OPERATIVE DIAGNOSIS:  THYROID NODULE  POST-OPERATIVE DIAGNOSIS:  THYROID NODULE  PROCEDURE:  Procedure(s): TOTAL THYROIDECTOMY (N/A)  SURGEON:  Surgeon(s) and Role:    [name], MD - Primary    * Jovita Kussmaul, MD - Assisting.  My assistant was crucial in helping with retraction, dissection, indentification of the anatomy.  ANESTHESIA:   local and general  EBL:  minimal   BLOOD ADMINISTERED:none  DRAINS: none   LOCAL MEDICATIONS USED:  BUPIVICAINE   SPECIMEN:  Source of Specimen:  Total thyroid with stitch in the sup right lobe  DISPOSITION OF SPECIMEN:  PATHOLOGY  COUNTS:  YES  TOURNIQUET:  * No tourniquets in log *  DICTATION: .Dragon Dictation  Complications: none   Counts: reported as correct x 2   Findings: Large right inferior, Large Left inf firm nodule  Details of the procedure:  The patient was taken back to the operating room. The patient was placed in supine position with bilateral SCDs in place.  The patient was prepped and draped in the usual sterile fashion. After appropriate anitbiotics were confirmed, a time-out was confirmed and all facts were verified.   A 4 cm incision was made approximately 2 fingerbreadths above the sternal notch. Bovie cautery was used to maintain hemostasis dissection was carried down through the platysma. The platysma was elevated and flaps were created superiorly and inferiorly to the thyroid cartilage as well as the sternal notch, repsectively. The strap muscles were identified in the midline and separated. Right-sided strap muscles were elevated off the anterior surface of the thyroid. This dissection was carried laterally. The middle thyroid vein was identified and doubly ligated. We proceeded to dissect away the superior lobe and Kitners were used to gently dissect the surrounding musculature from the thyroid. The superior thyroid vessels were  identified and doubly ligated with clips and transected with a Harmonic scalpel. At this time this freed up the superior lobe was able to deliver this into the wound. We also identified the superior parathyroid gland which we preserved.   We continued to dissect the thyroid off of the trachea from lateral to medial direction. The right recurrent laryngeal nerve was identified, and protected through the case.  The inferior thyroid vessels were identified ligated with clips. At this time Berry's ligament was dissected away from the trachea.  The strap muscles were identified in the midline and separated. Left-sided strap muscles were elevated off the anterior surface of the thyroid. This dissection was carried laterally. The middle thyroid vein was identified and doubly ligated. We proceeded to dissect away the superior lobe and Kitners were used to gently dissect the surrounding musculature from the thyroid. The superior thyroid vessels were identified and doubly ligated with clips and transected with a Harmonic scalpel. At this time this freed up the superior lobe was able to deliver this into the wound. We also identified the superior and inferior parathyroid gland which we preserved.   We continued to dissect the thyroid off of the trachea from lateral to medial direction. The left recurrent laryngeal nerve was identified, and protected through the case.  The inferior thyroid vessels were identified ligated with clips. At this time Berry's ligament was dissected away from the trachea.   A superior stitch was then placed in the superior thyroid lobe.    The area was irrigated out. The dissection bed was hemostatic. We placed fibrillar hemostatic agent  into the wound. Strap muscles were then reapproximated in the midline with interrupted 3-0 Vicryl stitches. The platysma was reapproximated using 3-0 Vicryl stitches in interrupted fashion. Skin was then reapproximated using a running subcuticular 4-0  Monocryl. The skin was then dressed with Dermabond. The patient was taken to the recovery room in stable condition.    PLAN OF CARE: Admit for overnight observation  PATIENT DISPOSITION:  PACU - hemodynamically stable.   Delay start of Pharmacological VTE agent (>24hrs) due to surgical blood loss or risk of bleeding: yes

## 2019-03-31 DIAGNOSIS — C73 Malignant neoplasm of thyroid gland: Secondary | ICD-10-CM | POA: Diagnosis not present

## 2019-03-31 LAB — BASIC METABOLIC PANEL
Anion gap: 9 (ref 5–15)
BUN: 14 mg/dL (ref 8–23)
CO2: 27 mmol/L (ref 22–32)
Calcium: 9.7 mg/dL (ref 8.9–10.3)
Chloride: 103 mmol/L (ref 98–111)
Creatinine, Ser: 0.78 mg/dL (ref 0.44–1.00)
GFR calc Af Amer: >60 mL/min (ref >60)
GFR calc non Af Amer: >60 mL/min (ref >60)
Glucose, Bld: 133 mg/dL — ABNORMAL HIGH (ref 70–99)
Potassium: 4.5 mmol/L (ref 3.5–5.1)
Sodium: 139 mmol/L (ref 135–145)

## 2019-03-31 LAB — MAGNESIUM: Magnesium: 1.9 mg/dL (ref 1.7–2.4)

## 2019-03-31 MED ORDER — LEVOTHYROXINE SODIUM 112 MCG PO TABS: 112.0000 ug | ORAL_TABLET | Freq: Every day | ORAL | 11 refills | Status: DC

## 2019-03-31 MED ORDER — MAGNESIUM OXIDE 400 (241.3 MG) MG PO TABS: 400.0000 mg | ORAL_TABLET | Freq: Two times a day (BID) | ORAL | 0 refills | Status: DC

## 2019-03-31 MED ORDER — CALCIUM CARBONATE-VITAMIN D 500-200 MG-UNIT PO TABS: 2.0000 | ORAL_TABLET | Freq: Three times a day (TID) | ORAL | 0 refills | Status: DC

## 2019-03-31 MED ORDER — TRAMADOL HCL 50 MG PO TABS: 50.0000 mg | ORAL_TABLET | Freq: Four times a day (QID) | ORAL | 0 refills | Status: DC | PRN

## 2019-03-31 NOTE — Progress Notes (Signed)
Discharge pt to home, instructions given and explained. Concern addressed. Belongings returned accordingly.

## 2019-03-31 NOTE — Discharge Instructions (Signed)
Thyroidectomy, Care After This sheet gives you information about how to care for yourself after your procedure. Your health care provider may also give you more specific instructions. If you have problems or questions, contact your health care provider. What can I expect after the procedure? After the procedure, it is common to have:  Mild pain in the neck or upper body, especially when swallowing.  A swollen neck.  A sore throat.  A weak or hoarse voice.  Slight tingling or numbness around your mouth, or in your fingers or toes. This may last for a day or two after surgery. This condition is caused by low levels of calcium. You may be given calcium supplements to treat it. Follow these instructions at home:  Medicines  Take over-the-counter and prescription medicines only as told by your health care provider.  Do not drive or use heavy machinery while taking prescription pain medicine.  Do not take medicines that contain aspirin and ibuprofen until your health care provider says that you can. These medicines can increase your risk of bleeding.  Take a thyroid hormone medicine as recommended by your health care provider. You will have to take this medicine for the rest of your life if your entire thyroid was removed. Eating and drinking  Start slowly with eating. You may need to have only liquids and soft foods for a few days or as directed by your health care provider.  To prevent or treat constipation while you are taking prescription pain medicine, your health care provider may recommend that you: ? Drink enough fluid to keep your urine pale yellow. ? Take over-the-counter or prescription medicines. ? Eat foods that are high in fiber, such as fresh fruits and vegetables, whole grains, and beans. ? Limit foods that are high in fat and processed sugars, such as fried and sweet foods. Incision care  Follow instructions from your health care provider about how to take care of your  incision. Make sure you: ? Wash your hands with soap and water before you change your bandage (dressing). If soap and water are not available, use hand sanitizer. ? Change your dressing as told by your health care provider. ? Leave stitches (sutures), skin glue, or adhesive strips in place. These skin closures may need to stay in place for 2 weeks or longer. If adhesive strip edges start to loosen and curl up, you may trim the loose edges. Do not remove adhesive strips completely unless your health care provider tells you to do that.  Check your incision area every day for signs of infection. Check for: ? Redness, swelling, or pain. ? Fluid or blood. ? Warmth. ? Pus or a bad smell.  Do not take baths, swim, or use a hot tub until your health care provider approves. Activity  For the first 10 days after the procedure or as instructed by your health care provider: ? Do not lift anything that is heavier than 10 lb (4.5 kg). ? Do not jog, swim, or do other strenuous exercises. ? Do not play contact sports.  Avoid sitting for a long time without moving. Get up to take short walks every 1-2 hours. This is needed to improve blood flow and breathing. Ask for help if you feel weak or unsteady.  Return to your normal activities as told by your health care provider. Ask your health care provider what activities are safe for you. General instructions  Do not use any products that contain nicotine or tobacco, such as   cigarettes and e-cigarettes. These can delay healing after surgery. If you need help quitting, ask your health care provider.  Keep all follow-up visits as told by your health care provider. This is important. Your health care provider needs to monitor the calcium level in your blood to make sure that it does not become low. Contact a health care provider if you:  Have a fever.  Have more redness, swelling, or pain around your incision area.  Have fluid or blood coming from your  incision area.  Notice that your incision area feels warm to the touch.  Have pus or a bad smell coming from your incision area.  Have trouble talking.  Have nausea or vomiting for more than 2 days. Get help right away if you:  Have trouble breathing.  Have trouble swallowing.  Develop a rash.  Develop a cough that gets worse.  Notice that your speech changes, or you have hoarseness that gets worse.  Develop numbness, tingling, or muscle spasms in the arms, hands, feet, or face. Summary  After the procedure, it is common to feel mild pain in the neck or upper body, especially when swallowing.  Take medicines as told by your health care provider. These include pain medicines and thyroid hormones, if required.  Follow instructions from your health care provider about how to take care of your incision. Watch for signs of infection.  Keep all follow-up visits as told by your health care provider. This is important. Your health care provider needs to monitor the calcium level in your blood to make sure that it does not become low.  Get help right away if you develop difficulty breathing, or numbness, tingling, or muscle spasms in the arms, hands, feet, or face. This information is not intended to replace advice given to you by your health care provider. Make sure you discuss any questions you have with your health care provider. Document Revised: 04/29/2018 Document Reviewed: 01/06/2017 Elsevier Patient Education  2020 [employer]  

## 2019-03-31 NOTE — Discharge Summary (Signed)
Physician Discharge Summary  Patient ID: Joann Barry MRN: [id number] DOB/AGE: 05/26/1945 74 y.o.  Admit date: 03/30/2019 Discharge date: 03/31/2019  Admission Diagnoses:thyroid nodules  Discharge Diagnoses:  Active Problems:   S/P thyroidectomy   Discharged Condition: good  Hospital Course: Pt was admitted post op.  Please see op not for full details.  Pt did well post op and was started on a liquid diet and adv as tol.  She tol that well.  She min incisional pain.  She was ambulating well on her own, had good pain control.  POD 1 labs all appeared within normal.  Pt was deemed stable for DC and Dc'd home.  Consults: None  Significant Diagnostic Studies: none  Treatments: surgery: as abiove  Discharge Exam: Blood pressure 139/81, pulse 65, temperature (!) 97.5 F (36.4 C), temperature source Oral, resp. rate 16, height 5\' 4"  (1.626 m), weight 73.9 kg, SpO2 96 %. General appearance: alert and cooperative Neck: inc c/d/i  Disposition: Discharge disposition: 01-Home or Self Care       Discharge Instructions    Diet - low sodium heart healthy   Complete by: As directed    Increase activity slowly   Complete by: As directed      Allergies as of 03/31/2019      Reactions   Sulfonamide Derivatives Swelling      Medication List    STOP taking these medications   Vitamin D3 50 MCG (2000 UT) Tabs     TAKE these medications   acetaminophen 650 MG CR tablet Commonly known as: TYLENOL Take 650 mg by mouth every 8 (eight) hours as needed for pain.   atenolol 50 MG tablet Commonly known as: TENORMIN Take 50 mg by mouth daily.   calcium-vitamin D 500-200 MG-UNIT tablet Commonly known as: OSCAL WITH D Take 2 tablets by mouth 3 (three) times daily.   escitalopram 10 MG tablet Commonly known as: LEXAPRO Take 10 mg by mouth at bedtime.   levothyroxine 112 MCG tablet Commonly known as: Synthroid Take 1 tablet (112 mcg total) by mouth daily.   losartan 25 MG  tablet Commonly known as: COZAAR Take 1 tablet by mouth twice daily   magnesium oxide 400 (241.3 Mg) MG tablet Commonly known as: MAG-OX Take 1 tablet (400 mg total) by mouth 2 (two) times daily.   Nasacort Allergy 24HR 55 MCG/ACT Aero nasal inhaler Generic drug: triamcinolone Place 1 spray into the nose daily as needed (allergies).   rosuvastatin 20 MG tablet Commonly known as: CRESTOR Take 1 tablet (20 mg total) by mouth daily. What changed: when to take this   traMADol 50 MG tablet Commonly known as: ULTRAM Take 1 tablet (50 mg total) by mouth every 6 (six) hours as needed (mild pain).        Signed: [name] 03/31/2019, 8:07 AM

## 2019-04-04 LAB — SURGICAL PATHOLOGY

## 2019-04-13 ENCOUNTER — Other Ambulatory Visit: Payer: Self-pay | Admitting: Cardiovascular Disease

## 2019-04-13 NOTE — Telephone Encounter (Signed)
Rx request sent to pharmacy.  

## 2019-04-19 ENCOUNTER — Ambulatory Visit (INDEPENDENT_AMBULATORY_CARE_PROVIDER_SITE_OTHER): Payer: Medicare Other | Admitting: Pharmacist

## 2019-04-19 ENCOUNTER — Encounter: Payer: Self-pay | Admitting: Internal Medicine

## 2019-04-19 ENCOUNTER — Encounter: Payer: Self-pay | Admitting: Pharmacist

## 2019-04-19 ENCOUNTER — Other Ambulatory Visit: Payer: Self-pay

## 2019-04-19 DIAGNOSIS — E7841 Elevated Lipoprotein(a): Secondary | ICD-10-CM

## 2019-04-19 HISTORY — DX: Elevated lipoprotein(a): E78.41

## 2019-04-19 NOTE — Progress Notes (Signed)
Patient ID: Joann Barry                 DOB: 1945/08/08                    MRN: [id number]     HPI: ELI RYDALCH is a 74 y.o. female patient referred to lipid clinic by Dr [name]. PMH is significant for aortic valve stenosis, hypertension osteopenia, anxiety, and GERD. Noted recent lipid panel with LDL at 59mg /dL and LpA at 243. Patient presents to clinic for potential notation of PCSK9 inhibitors.   Current Medications:  Rosuvastatin 20mg  daily  LpA goal: ~25% drop  Diet: balanced diet  Exercise: walks few times per week  Family History: family history includes Pancreatic cancer in her mother. There is no history of Colon cancer, Rectal cancer, or Stomach cancer. DM insulin dependent in daughter. No history of high cholesterol, ASCVD or cardiac problems.  Social History: denied tobacco or alcohol use  Labs: 03/08/2020: CHO 125; TG 86; HDL 49; LDL-c 59; LPA 243 (rosuvastatin 20mg  daily)  Past Medical History:  Diagnosis Date  . Anxiety   . Aortic stenosis    documented on cardiac clearance note  . Arthritis   . Heart murmur   . Hypercholesterolemia   . Hypertension   . Personal history of colonic polyps   . Thyroid nodule    Bilateral    Current Outpatient Medications on File Prior to Visit  Medication Sig Dispense Refill  . acetaminophen (TYLENOL) 650 MG CR tablet Take 650 mg by mouth every 8 (eight) hours as needed for pain.    Marland Kitchen atenolol (TENORMIN) 50 MG tablet Take 50 mg by mouth daily.    . calcium-vitamin D (OSCAL WITH D) 500-200 MG-UNIT tablet Take 2 tablets by mouth 3 (three) times daily. 90 tablet 0  . escitalopram (LEXAPRO) 10 MG tablet Take 10 mg by mouth at bedtime.    Marland Kitchen levothyroxine (SYNTHROID) 112 MCG tablet Take 1 tablet (112 mcg total) by mouth daily. 30 tablet 11  . losartan (COZAAR) 25 MG tablet Take 1 tablet (25 mg total) by mouth 2 (two) times daily. 180 tablet 0  . magnesium oxide (MAG-OX) 400 (241.3 Mg) MG tablet Take 1 tablet (400 mg total) by  mouth 2 (two) times daily. 60 tablet 0  . rosuvastatin (CRESTOR) 20 MG tablet Take 1 tablet (20 mg total) by mouth daily. (Patient taking differently: Take 20 mg by mouth every evening. ) 90 tablet 3  . traMADol (ULTRAM) 50 MG tablet Take 1 tablet (50 mg total) by mouth every 6 (six) hours as needed (mild pain). 20 tablet 0  . triamcinolone (NASACORT ALLERGY 24HR) 55 MCG/ACT AERO nasal inhaler Place 1 spray into the nose daily as needed (allergies).     No current facility-administered medications on file prior to visit.    Allergies  Allergen Reactions  . Sulfonamide Derivatives Swelling    Elevated lipoprotein A level Patient currently on rosuvastatin 20mg  daily with little impact on LpA and exciting aortic valve stenosis . She is a good candidate for PCSK9i therapy to decrease LpA.  Repatha/Praluent dose, administration ,storage, monitoring, common side effects, prior-authorization process and available patient assistance were discussed during OV.  Will apply for Reapatha SureClick 140mg  every 14 days, repeat fasting lipid and LFT 3 months after initiating therapy. Repatha SureClick 140mg  sample (lot [id number] exp 01-23) was provided to patient for initial dose while in office.   Dorita Rowlands Rodriguez-Guzman PharmD, BCPS, CPP  [place] [id number] 04/19/2019 10:07 AM

## 2019-04-19 NOTE — Patient Instructions (Signed)
[facility] (pharmacist) [phone] Joann Barry/Kristin/Haleigh  *Will start paperwork for Repatha/Praluent approval* *Plan to apply for [place] for financial assistance if needed* *Repeat blood work 3 months after starting Repatha*    High Cholesterol  High cholesterol is a condition in which the blood has high levels of a white, waxy, fat-like substance (cholesterol). The human body needs small amounts of cholesterol. The liver makes all the cholesterol that the body needs. Extra (excess) cholesterol comes from the food that we eat. Cholesterol is carried from the liver by the blood through the blood vessels. If you have high cholesterol, deposits (plaques) may build up on the walls of your blood vessels (arteries). Plaques make the arteries narrower and stiffer. Cholesterol plaques increase your risk for heart attack and stroke. Work with your health care provider to keep your cholesterol levels in a healthy range. What increases the risk? This condition is more likely to develop in people who:  Eat foods that are high in animal fat (saturated fat) or cholesterol.  Are overweight.  Are not getting enough exercise.  Have a family history of high cholesterol. What are the signs or symptoms? There are no symptoms of this condition. How is this diagnosed? This condition may be diagnosed from the results of a blood test.  If you are older than age 75, your health care provider may check your cholesterol every 4-6 years.  You may be checked more often if you already have high cholesterol or other risk factors for heart disease. The blood test for cholesterol measures:  "Bad" cholesterol (LDL cholesterol). This is the main type of cholesterol that causes heart disease. The desired level for LDL is less than 100.  "Good" cholesterol (HDL cholesterol). This type helps to protect against heart disease by cleaning the arteries and carrying the LDL away. The desired level for  HDL is 60 or higher.  Triglycerides. These are fats that the body can store or burn for energy. The desired number for triglycerides is lower than 150.  Total cholesterol. This is a measure of the total amount of cholesterol in your blood, including LDL cholesterol, HDL cholesterol, and triglycerides. A healthy number is less than 200. How is this treated? This condition is treated with diet changes, lifestyle changes, and medicines. Diet changes  This may include eating more whole grains, fruits, vegetables, nuts, and fish.  This may also include cutting back on red meat and foods that have a lot of added sugar. Lifestyle changes  Changes may include getting at least 40 minutes of aerobic exercise 3 times a week. Aerobic exercises include walking, biking, and swimming. Aerobic exercise along with a healthy diet can help you maintain a healthy weight.  Changes may also include quitting smoking. Medicines  Medicines are usually given if diet and lifestyle changes have failed to reduce your cholesterol to healthy levels.  Your health care provider may prescribe a statin medicine. Statin medicines have been shown to reduce cholesterol, which can reduce the risk of heart disease. Follow these instructions at home: Eating and drinking If told by your health care provider:  Eat chicken (without skin), fish, veal, shellfish, ground [place] breast, and round or loin cuts of red meat.  Do not eat fried foods or fatty meats, such as hot dogs and salami.  Eat plenty of fruits, such as apples.  Eat plenty of vegetables, such as broccoli, potatoes, and carrots.  Eat beans, peas, and lentils.  Eat grains such as barley, rice, couscous, and bulgur  wheat.  Eat pasta without cream sauces.  Use skim or nonfat milk, and eat low-fat or nonfat yogurt and cheeses.  Do not eat or drink whole milk, cream, ice cream, egg yolks, or hard cheeses.  Do not eat stick margarine or tub margarines that  contain trans fats (also called partially hydrogenated oils).  Do not eat saturated tropical oils, such as coconut oil and palm oil.  Do not eat cakes, cookies, crackers, or other baked goods that contain trans fats.  General instructions  Exercise as directed by your health care provider. Increase your activity level with activities such as gardening, walking, and taking the stairs.  Take over-the-counter and prescription medicines only as told by your health care provider.  Do not use any products that contain nicotine or tobacco, such as cigarettes and e-cigarettes. If you need help quitting, ask your health care provider.  Keep all follow-up visits as told by your health care provider. This is important. Contact a health care provider if:  You are struggling to maintain a healthy diet or weight.  You need help to start on an exercise program.  You need help to stop smoking. Get help right away if:  You have chest pain.  You have trouble breathing. This information is not intended to replace advice given to you by your health care provider. Make sure you discuss any questions you have with your health care provider. Document Revised: 03/06/2017 Document Reviewed: 09/01/2015 Elsevier Patient Education  [place].

## 2019-04-19 NOTE — Assessment & Plan Note (Signed)
Patient currently on rosuvastatin 20mg  daily with little impact on LpA and exciting aortic valve stenosis . She is a good candidate for PCSK9i therapy to decrease LpA.  Repatha/Praluent dose, administration ,storage, monitoring, common side effects, prior-authorization process and available patient assistance were discussed during OV.  Will apply for Reapatha SureClick 140mg  every 14 days, repeat fasting lipid and LFT 3 months after initiating therapy. Repatha SureClick 140mg  sample (lot [id number] exp 01-23) was provided to patient for initial dose while in office.

## 2019-04-28 ENCOUNTER — Telehealth: Payer: Self-pay

## 2019-04-28 NOTE — Telephone Encounter (Signed)
Called and lmomed the pt to let them know that they were denied repatha and that we are actively working on an appeals and we will let them know asap when the decision gets overturned.

## 2019-04-29 ENCOUNTER — Other Ambulatory Visit: Payer: Self-pay

## 2019-04-29 MED ORDER — ROSUVASTATIN CALCIUM 20 MG PO TABS: 20.0000 mg | ORAL_TABLET | Freq: Every evening | ORAL | 3 refills | Status: DC

## 2019-05-02 ENCOUNTER — Other Ambulatory Visit: Payer: Self-pay

## 2019-05-02 MED ORDER — LOSARTAN POTASSIUM 25 MG PO TABS: 25.0000 mg | ORAL_TABLET | Freq: Two times a day (BID) | ORAL | 0 refills | Status: DC

## 2019-05-09 ENCOUNTER — Encounter: Payer: Self-pay | Admitting: Internal Medicine

## 2019-05-09 ENCOUNTER — Ambulatory Visit (AMBULATORY_SURGERY_CENTER): Payer: Self-pay | Admitting: *Deleted

## 2019-05-09 ENCOUNTER — Telehealth: Payer: Self-pay | Admitting: Internal Medicine

## 2019-05-09 ENCOUNTER — Other Ambulatory Visit: Payer: Self-pay

## 2019-05-09 VITALS — Temp 97.8°F | Ht 64.5 in | Wt 161.0 lb

## 2019-05-09 DIAGNOSIS — Z01818 Encounter for other preprocedural examination: Secondary | ICD-10-CM

## 2019-05-09 DIAGNOSIS — Z860100 Personal history of colon polyps, unspecified: Secondary | ICD-10-CM

## 2019-05-09 MED ORDER — NA SULFATE-K SULFATE-MG SULF 17.5-3.13-1.6 GM/177ML PO SOLN: ORAL | 0 refills | Status: DC

## 2019-05-09 NOTE — Telephone Encounter (Signed)
Noted. Thanks.

## 2019-05-09 NOTE — Telephone Encounter (Signed)
Pt called to let you know that she spoke with her surgeon who told her that it should be no problem to have a colonoscopy.

## 2019-05-09 NOTE — Progress Notes (Signed)
Patient is here in-person for PV. Patient denies any allergies to eggs or soy. Patient denies any problems with anesthesia/sedation. Patient denies any oxygen use at home. Patient denies taking any diet/weight loss medications or blood thinners. Patient is not being treated for MRSA or C-diff. EMMI education assisgned to the patient for the procedure, this was explained and instructions given to patient. COVID-19 screening test is on 3/2, the pt is aware. Pt is aware that care partner will wait in the car during procedure; if they feel like they will be too hot or cold to wait in the car; they may wait in the 4 th floor lobby. Patient is aware to bring only one care partner. We want them to wear a mask (we do not have any that we can provide them), practice social distancing, and we will check their temperatures when they get here.  I did remind the patient that their care partner needs to stay in the parking lot the entire time and have a cell phone available, we will call them when the pt is ready for discharge. Patient will wear mask into building.    Patient had a thyroidectomy on 03/30/2019, she is having some swelling at the site per pt. I explained to her that she needs to contact her surgeon and gets clearance before the colonoscopy. Pt understands. She will contact the surgeon today and she will call me back with that information.

## 2019-05-12 ENCOUNTER — Telehealth: Payer: Self-pay

## 2019-05-12 MED ORDER — REPATHA SURECLICK 140 MG/ML ~~LOC~~ SOAJ: 140.0000 mg | SUBCUTANEOUS | 11 refills | Status: DC

## 2019-05-12 NOTE — Telephone Encounter (Signed)
Called and spoke w/pt repatha approved, rx sent, [employer] granted, pt voiced understanding and instructed to call if they have issues

## 2019-05-17 ENCOUNTER — Other Ambulatory Visit: Payer: Self-pay

## 2019-05-17 ENCOUNTER — Other Ambulatory Visit: Payer: Self-pay | Admitting: Internal Medicine

## 2019-05-17 ENCOUNTER — Ambulatory Visit (INDEPENDENT_AMBULATORY_CARE_PROVIDER_SITE_OTHER): Payer: Medicare Other

## 2019-05-17 DIAGNOSIS — Z1159 Encounter for screening for other viral diseases: Secondary | ICD-10-CM

## 2019-05-18 LAB — SARS CORONAVIRUS 2 (TAT 6-24 HRS): SARS Coronavirus 2: NEGATIVE

## 2019-05-20 ENCOUNTER — Ambulatory Visit (AMBULATORY_SURGERY_CENTER): Payer: Medicare Other | Admitting: Internal Medicine

## 2019-05-20 ENCOUNTER — Encounter: Payer: Self-pay | Admitting: Internal Medicine

## 2019-05-20 ENCOUNTER — Other Ambulatory Visit: Payer: Self-pay

## 2019-05-20 VITALS — BP 89/47 | HR 65 | Temp 97.7°F | Resp 18 | Ht 64.5 in | Wt 161.0 lb

## 2019-05-20 DIAGNOSIS — D123 Benign neoplasm of transverse colon: Secondary | ICD-10-CM

## 2019-05-20 DIAGNOSIS — Z86010 Personal history of colonic polyps: Secondary | ICD-10-CM | POA: Diagnosis not present

## 2019-05-20 MED ORDER — SODIUM CHLORIDE 0.9 % IV SOLN: 500.0000 mL | Freq: Once | INTRAVENOUS | Status: DC

## 2019-05-20 NOTE — Patient Instructions (Signed)
YOU HAD AN ENDOSCOPIC PROCEDURE TODAY AT THE [facility]:   Refer to the procedure report that was given to you for any specific questions about what was found during the examination.  If the procedure report does not answer your questions, please call your gastroenterologist to clarify.  If you requested that your care partner not be given the details of your procedure findings, then the procedure report has been included in a sealed envelope for you to review at your convenience later.  ** Handout given on polyps**  YOU SHOULD EXPECT: Some feelings of bloating in the abdomen. Passage of more gas than usual.  Walking can help get rid of the air that was put into your GI tract during the procedure and reduce the bloating. If you had a lower endoscopy (such as a colonoscopy or flexible sigmoidoscopy) you may notice spotting of blood in your stool or on the toilet paper. If you underwent a bowel prep for your procedure, you may not have a normal bowel movement for a few days.  Please Note:  You might notice some irritation and congestion in your nose or some drainage.  This is from the oxygen used during your procedure.  There is no need for concern and it should clear up in a day or so.  SYMPTOMS TO REPORT IMMEDIATELY:  Following lower endoscopy (colonoscopy or flexible sigmoidoscopy):  Excessive amounts of blood in the stool  Significant tenderness or worsening of abdominal pains  Swelling of the abdomen that is new, acute  Fever of 100F or higher   For urgent or emergent issues, a gastroenterologist can be reached at any hour by calling [phone]. Do not use MyChart messaging for urgent concerns.    DIET:  We do recommend a small meal at first, but then you may proceed to your regular diet.  Drink plenty of fluids but you should avoid alcoholic beverages for 24 hours.  ACTIVITY:  You should plan to take it easy for the rest of today and you should NOT DRIVE or use heavy  machinery until tomorrow (because of the sedation medicines used during the test).    FOLLOW UP: Our staff will call the number listed on your records 48-72 hours following your procedure to check on you and address any questions or concerns that you may have regarding the information given to you following your procedure. If we do not reach you, we will leave a message.  We will attempt to reach you two times.  During this call, we will ask if you have developed any symptoms of COVID 19. If you develop any symptoms (ie: fever, flu-like symptoms, shortness of breath, cough etc.) before then, please call [phone].  If you test positive for Covid 19 in the 2 weeks post procedure, please call and report this information to us.    If any biopsies were taken you will be contacted by phone or by letter within the next 1-3 weeks.  Please call us at [phone] if you have not heard about the biopsies in 3 weeks.    SIGNATURES/CONFIDENTIALITY: You and/or your care partner have signed paperwork which will be entered into your electronic medical record.  These signatures attest to the fact that that the information above on your After Visit Summary has been reviewed and is understood.  Full responsibility of the confidentiality of this discharge information lies with you and/or your care-partner.  

## 2019-05-20 NOTE — Op Note (Signed)
[place] Patient Name: Joann Barry Procedure Date: 05/20/2019 11:24 AM MRN: [id number] Endoscopist: Docia Chuck. Henrene Pastor , MD Age: 74 Referring MD:  Date of Birth: [birth year] Gender: Female Account #: [id number] Procedure:                Colonoscopy with cold snare polypectomy x 2 Indications:              High risk colon cancer surveillance: Personal                            history of multiple (3 or more) adenomas, High risk                            colon cancer surveillance: Personal history of                            sessile serrated colon polyp (less than 10 mm in                            size) with no dysplasia. Previous examinations                            2005, 2010, 2015 Medicines:                Monitored Anesthesia Care Procedure:                Pre-Anesthesia Assessment:                           - Prior to the procedure, a History and Physical                            was performed, and patient medications and                            allergies were reviewed. The patient's tolerance of                            previous anesthesia was also reviewed. The risks                            and benefits of the procedure and the sedation                            options and risks were discussed with the patient.                            All questions were answered, and informed consent                            was obtained. Prior Anticoagulants: The patient has                            taken no previous anticoagulant or antiplatelet  agents. ASA Grade Assessment: II - A patient with                            mild systemic disease. After reviewing the risks                            and benefits, the patient was deemed in                            satisfactory condition to undergo the procedure.                           After obtaining informed consent, the colonoscope                            was passed under direct  vision. Throughout the                            procedure, the patient's blood pressure, pulse, and                            oxygen saturations were monitored continuously. The                            Colonoscope was introduced through the anus and                            advanced to the the cecum, identified by                            appendiceal orifice and ileocecal valve. The                            ileocecal valve, appendiceal orifice, and rectum                            were photographed. The quality of the bowel                            preparation was excellent. The colonoscopy was                            performed without difficulty. The patient tolerated                            the procedure well. The bowel preparation used was                            SUPREP via split dose instruction. Scope In: 11:30:59 AM Scope Out: 11:42:43 AM Scope Withdrawal Time: 0 hours 9 minutes 18 seconds  Total Procedure Duration: 0 hours 11 minutes 44 seconds  Findings:                 Two polyps were found in the transverse colon. The  polyps were 1 to 2 mm in size. These polyps were                            removed with a cold snare. Resection and retrieval                            were complete.                           The exam was otherwise without abnormality on                            direct and retroflexion views. Complications:            No immediate complications. Estimated blood loss:                            None. Estimated Blood Loss:     Estimated blood loss: none. Impression:               - Two 1 to 2 mm polyps in the transverse colon,                            removed with a cold snare. Resected and retrieved.                           - The examination was otherwise normal on direct                            and retroflexion views. Recommendation:           - Repeat colonoscopy is not recommended for                             surveillance.                           - Patient has a contact number available for                            emergencies. The signs and symptoms of potential                            delayed complications were discussed with the                            patient. Return to normal activities tomorrow.                            Written discharge instructions were provided to the                            patient.                           - Resume previous diet.                           -  Continue present medications.                           - Await pathology results. Docia Chuck. Henrene Pastor, MD 05/20/2019 11:47:44 AM This report has been signed electronically.

## 2019-05-20 NOTE — Progress Notes (Signed)
Pt's states no medical or surgical changes since previsit or office visit.  TEMP-LC  V/S-CW 

## 2019-05-20 NOTE — Progress Notes (Signed)
Called to room to assist during endoscopic procedure.  Patient ID and intended procedure confirmed with present staff. Received instructions for my participation in the procedure from the performing physician.  

## 2019-05-20 NOTE — Progress Notes (Signed)
pt tolerated well. VSS. awake and to recovery. Report given to RN.  

## 2019-05-24 ENCOUNTER — Telehealth: Payer: Self-pay | Admitting: *Deleted

## 2019-05-24 ENCOUNTER — Encounter: Payer: Self-pay | Admitting: Internal Medicine

## 2019-05-24 NOTE — Telephone Encounter (Signed)
  Follow up Call-  Call back number 05/20/2019  Post procedure Call Back phone  # [phone]  Permission to leave phone message Yes  Some recent data might be hidden     Patient questions:  Do you have a fever, pain , or abdominal swelling? No. Pain Score  0 *  Have you tolerated food without any problems? Yes.    Have you been able to return to your normal activities? Yes.    Do you have any questions about your discharge instructions: Diet   No. Medications  No. Follow up visit  No.  Do you have questions or concerns about your Care? No.  Actions: * If pain score is 4 or above: No action needed, pain <4  1. Have you developed a fever since your procedure? NO  2.   Have you had an respiratory symptoms (SOB or cough) since your procedure? NO  3.   Have you tested positive for COVID 19 since your procedure NO  4.   Have you had any family members/close contacts diagnosed with the COVID 19 since your procedure?  NO   If yes to any of these questions please route to Joylene John, RN and Alphonsa Gin, RN.

## 2019-05-30 ENCOUNTER — Other Ambulatory Visit (HOSPITAL_COMMUNITY): Payer: Self-pay | Admitting: Endocrinology

## 2019-05-30 DIAGNOSIS — C73 Malignant neoplasm of thyroid gland: Secondary | ICD-10-CM

## 2019-06-01 ENCOUNTER — Other Ambulatory Visit (HOSPITAL_COMMUNITY): Payer: Self-pay | Admitting: Internal Medicine

## 2019-06-01 DIAGNOSIS — C73 Malignant neoplasm of thyroid gland: Secondary | ICD-10-CM

## 2019-06-02 ENCOUNTER — Ambulatory Visit: Payer: Medicare Other | Attending: Internal Medicine

## 2019-06-02 DIAGNOSIS — Z23 Encounter for immunization: Secondary | ICD-10-CM

## 2019-06-02 NOTE — Progress Notes (Signed)
   Covid-19 Vaccination Clinic  Name:  Joann Barry    MRN: [id number] DOB: 07-Nov-1945  06/02/2019  Joann Barry was observed post Covid-19 immunization for 15 minutes without incident. She was provided with Vaccine Information Sheet and instruction to access the V-Safe system.   Joann Barry was instructed to call 911 with any severe reactions post vaccine: Marland Kitchen Difficulty breathing  . Swelling of face and throat  . A fast heartbeat  . A bad rash all over body  . Dizziness and weakness   Immunizations Administered    Name Date Dose VIS Date Route   Pfizer COVID-19 Vaccine 06/02/2019  8:49 AM 0.3 mL 02/25/2019 Intramuscular   Manufacturer: [place]   Lot: [id number]   [place]: [id number]

## 2019-06-06 ENCOUNTER — Encounter (HOSPITAL_COMMUNITY): Admission: RE | Admit: 2019-06-06 | Payer: Medicare Other | Source: Ambulatory Visit

## 2019-06-07 ENCOUNTER — Encounter (HOSPITAL_COMMUNITY): Payer: Medicare Other

## 2019-06-08 ENCOUNTER — Encounter (HOSPITAL_COMMUNITY): Payer: Medicare Other

## 2019-06-10 ENCOUNTER — Other Ambulatory Visit (HOSPITAL_COMMUNITY): Payer: Medicare Other

## 2019-06-10 ENCOUNTER — Other Ambulatory Visit: Payer: Self-pay

## 2019-06-10 MED ORDER — LOSARTAN POTASSIUM 25 MG PO TABS: 25.0000 mg | ORAL_TABLET | Freq: Two times a day (BID) | ORAL | 3 refills | Status: DC

## 2019-06-20 ENCOUNTER — Encounter (HOSPITAL_COMMUNITY)
Admission: RE | Admit: 2019-06-20 | Discharge: 2019-06-20 | Disposition: A | Discharge status: Home / Self Care | Payer: Medicare Other | Source: Ambulatory Visit | Attending: Internal Medicine | Admitting: Internal Medicine

## 2019-06-20 ENCOUNTER — Other Ambulatory Visit: Payer: Self-pay

## 2019-06-20 ENCOUNTER — Ambulatory Visit (HOSPITAL_COMMUNITY): Payer: Medicare Other

## 2019-06-20 DIAGNOSIS — C73 Malignant neoplasm of thyroid gland: Secondary | ICD-10-CM

## 2019-06-20 MED ORDER — THYROTROPIN ALFA 1.1 MG IM SOLR
0.9000 mg | INTRAMUSCULAR | Status: AC
  Administered 2019-06-20: 0.9 mg via INTRAMUSCULAR

## 2019-06-20 MED ORDER — STERILE WATER FOR INJECTION IJ SOLN
INTRAMUSCULAR | Status: AC
  Filled 2019-06-20: qty 10

## 2019-06-21 ENCOUNTER — Ambulatory Visit (HOSPITAL_COMMUNITY): Payer: Medicare Other

## 2019-06-21 ENCOUNTER — Encounter (HOSPITAL_COMMUNITY)
Admission: RE | Admit: 2019-06-21 | Discharge: 2019-06-21 | Disposition: A | Discharge status: Home / Self Care | Payer: Medicare Other | Source: Ambulatory Visit | Attending: Internal Medicine | Admitting: Internal Medicine

## 2019-06-21 DIAGNOSIS — C73 Malignant neoplasm of thyroid gland: Secondary | ICD-10-CM | POA: Diagnosis not present

## 2019-06-21 MED ORDER — STERILE WATER FOR INJECTION IJ SOLN
INTRAMUSCULAR | Status: AC
  Filled 2019-06-21: qty 10

## 2019-06-21 MED ORDER — THYROTROPIN ALFA 1.1 MG IM SOLR
0.9000 mg | INTRAMUSCULAR | Status: AC
  Administered 2019-06-21: 0.9 mg via INTRAMUSCULAR

## 2019-06-22 ENCOUNTER — Other Ambulatory Visit: Payer: Self-pay

## 2019-06-22 ENCOUNTER — Ambulatory Visit (HOSPITAL_COMMUNITY): Payer: Medicare Other

## 2019-06-22 ENCOUNTER — Encounter (HOSPITAL_COMMUNITY)
Admission: RE | Admit: 2019-06-22 | Discharge: 2019-06-22 | Disposition: A | Discharge status: Home / Self Care | Payer: Medicare Other | Source: Ambulatory Visit | Attending: Internal Medicine | Admitting: Internal Medicine

## 2019-06-22 MED ORDER — SODIUM IODIDE I 131 CAPSULE: 51.8000 | Freq: Once | INTRAVENOUS | Status: DC | PRN

## 2019-07-01 ENCOUNTER — Encounter (HOSPITAL_COMMUNITY)
Admission: RE | Admit: 2019-07-01 | Discharge: 2019-07-01 | Disposition: A | Discharge status: Home / Self Care | Payer: Medicare Other | Source: Ambulatory Visit | Attending: Internal Medicine | Admitting: Internal Medicine

## 2019-07-01 ENCOUNTER — Other Ambulatory Visit: Payer: Self-pay

## 2019-07-01 ENCOUNTER — Other Ambulatory Visit (HOSPITAL_COMMUNITY): Payer: Medicare Other

## 2019-07-01 DIAGNOSIS — C73 Malignant neoplasm of thyroid gland: Secondary | ICD-10-CM | POA: Diagnosis present

## 2019-07-06 ENCOUNTER — Ambulatory Visit: Payer: Medicare Other

## 2019-07-12 ENCOUNTER — Ambulatory Visit: Payer: Medicare Other | Attending: Internal Medicine

## 2019-07-12 DIAGNOSIS — Z23 Encounter for immunization: Secondary | ICD-10-CM

## 2019-07-12 NOTE — Progress Notes (Signed)
   Covid-19 Vaccination Clinic  Name:  TAKINA NOVEL    MRN: [id number] DOB: 07-23-1945  07/12/2019  Ms. Mcmackin was observed post Covid-19 immunization for 15 minutes without incident. She was provided with Vaccine Information Sheet and instruction to access the V-Safe system.   Ms. Baldner was instructed to call 911 with any severe reactions post vaccine: Marland Kitchen Difficulty breathing  . Swelling of face and throat  . A fast heartbeat  . A bad rash all over body  . Dizziness and weakness   Immunizations Administered    Name Date Dose VIS Date Route   Pfizer COVID-19 Vaccine 07/12/2019 11:08 AM 0.3 mL 05/11/2018 Intramuscular   Manufacturer: [place]   Lot: [id number]   [place]: [id number]

## 2019-07-20 ENCOUNTER — Other Ambulatory Visit: Payer: Self-pay | Admitting: Cardiovascular Disease

## 2019-08-23 ENCOUNTER — Telehealth: Payer: Self-pay | Admitting: Cardiovascular Disease

## 2019-08-23 NOTE — Telephone Encounter (Signed)
I went in pt 's chart to see who called her today. Did not see where anybody called, unless it was confirmation of her appt for tomorrow.

## 2019-08-24 ENCOUNTER — Other Ambulatory Visit: Payer: Self-pay

## 2019-08-24 ENCOUNTER — Ambulatory Visit (HOSPITAL_COMMUNITY): Payer: Medicare Other | Attending: Cardiology

## 2019-08-24 DIAGNOSIS — I350 Nonrheumatic aortic (valve) stenosis: Secondary | ICD-10-CM | POA: Diagnosis present

## 2019-10-13 ENCOUNTER — Encounter: Payer: Self-pay | Admitting: Cardiovascular Disease

## 2019-10-13 ENCOUNTER — Ambulatory Visit: Payer: Medicare Other | Admitting: Cardiovascular Disease

## 2019-10-13 ENCOUNTER — Other Ambulatory Visit: Payer: Self-pay

## 2019-10-13 DIAGNOSIS — E785 Hyperlipidemia, unspecified: Secondary | ICD-10-CM | POA: Diagnosis not present

## 2019-10-13 DIAGNOSIS — I10 Essential (primary) hypertension: Secondary | ICD-10-CM

## 2019-10-13 DIAGNOSIS — I350 Nonrheumatic aortic (valve) stenosis: Secondary | ICD-10-CM | POA: Diagnosis not present

## 2019-10-13 DIAGNOSIS — I712 Thoracic aortic aneurysm, without rupture, unspecified: Secondary | ICD-10-CM

## 2019-10-13 DIAGNOSIS — I519 Heart disease, unspecified: Secondary | ICD-10-CM | POA: Diagnosis not present

## 2019-10-13 DIAGNOSIS — I5189 Other ill-defined heart diseases: Secondary | ICD-10-CM

## 2019-10-13 DIAGNOSIS — E7841 Elevated Lipoprotein(a): Secondary | ICD-10-CM | POA: Diagnosis not present

## 2019-10-13 DIAGNOSIS — I77810 Thoracic aortic ectasia: Secondary | ICD-10-CM

## 2019-10-13 MED ORDER — LOSARTAN POTASSIUM 50 MG PO TABS: ORAL_TABLET | ORAL | 3 refills | Status: DC

## 2019-10-13 NOTE — Patient Instructions (Addendum)
Medication Instructions:  CHANGE THE WAY YOU ARE TAKING YOUR LOSARTAN- TAKE 50MG  IN THE MORNING AND 25MG  AT NIGHT  *If you need a refill on your cardiac medications before your next appointment, please call your pharmacy*   Lab Work: Fasting labs: cmet Cbc tsh Lipid Lpa  If you have labs (blood work) drawn today and your tests are completely normal, you will receive your results only by: Marland Kitchen MyChart Message (if you have MyChart) OR . A paper copy in the mail If you have any lab test that is abnormal or we need to change your treatment, we will call you to review the results.   Follow-Up: Based upon Dr.Cooper's assessment  Other Instructions Ambulatory referral to Dr. Sherren Mocha

## 2019-10-13 NOTE — Progress Notes (Signed)
Cardiology Office Note    Date:  10/15/2019   ID:  Joann Barry, Joann Barry 1945/04/03, MRN [id number]  PCP:  [name], MD  Cardiologist:  [name], MD   Chief Complaint  Patient presents with  . Follow-up    6 MONTH  . Shortness of Breath   F/U evaluation, referred through the courtesy of Dr. [name] for evaluation of aortic stenosis.  History of Present Illness:  Joann Barry is a 74 y.o. female who I had seen in October 2016 when she was referred by [name] for evaluation of aortic stenosis. I last saw her in July 2020. She presents for a 1 year follow-up evaluation.  Joann Barry has a >  30 year history of hypertension and has been on atenolol 50 mg daily.  She also has a history of osteopenia, anxiety, and GERD.  She had recently seen [name] in because of a systolic cardiac murmur.  She was referred for echo Doppler study which was done on 12/02/2016.  This showed an EF of 55-60%.  She had normal wall motion with grade 1 diastolic dysfunction.  Aortic valve was moderately calcified and functionally bicuspid.  Valve motion was restricted.  She was felt to have moderate aortic stenosis with trivial AR.  Her mean gradient was 28, and peak instantaneous gradient 45 mm.  Her ascending aorta was mildly dilated at 4.5 cm..  There was mild MR, and there was mild left atrial dilatation.  When I initially saw her, she denied any episodes of chest pain or shortness of breath. .  She denied any presyncope or syncope and during periods of anxiety had noticed some mild increased respirations.  When I saw her, since she was asymptomatic.  I recommended close surveillance.  She was hypertensive and had grade 1 diastolic dysfunction and added low-dose losartan 25 mg daily to take with her previously atenolol regimen.  Since her initial evaluation, she has continued to be entirely asymptomatic.  There is only rare shortness of breath when she gets upset.  There is no change in  exertional capacity.  She walks.  She denies palpitations.  She underwent a follow-up echo Doppler study on 05/01/2017.  This had shown some progression of her aortic stenosis such that her mean gradient had increased to 37 mm with a peak instantaneous gradient of 62 mm.  Calculated valve area was 0.8 cm.  There was grade 2 diastolic dysfunction.    When I saw her on May 12, 2017, she  continued to remain asymptomatic.  She specifically denied chest pain, presyncope or syncope, or change in exercise tolerance.  I had recommended that when she had laboratory done with [name] office that they obtain an LP(a) since an increased level has been associated with aortic stenosis.  Her LP(a) level came back elevated at 180.  She has not been on any antilipid therapy.  TC was 182, triglycerides 111, HDL 47, LDL 113.  Her TSH was 0.08 which is low.  She underwent a six-month follow-up echo Doppler study on October 21, 2017.  This essentially was unchanged and showed an EF of 60 to 65% with grade 2 diastolic dysfunction.  Her aortic valve is functionally bicuspid with fusion of the right and left coronary cusps and is calcified.  Her mean gradient was 34 with a peak gradient of 61.  Valve area was 0.73 cm.  Aortic root was mildly dilated with acsending aorta at 44 mm.  PA pressure  was 33.    When I  saw her in September 2019 I reviewed her echo Doppler data.  I recommended initiation of Crestor 20 mg.  Her ECG did not show evidence for LV strain or LVH.  I again had a long discussion regarding aortic stenosis and she remained completely asymptomatic.  I last saw her in July 2020 and she continued to be entirely asymptomatic.  She denied chest pain PND orthopnea exertional dyspnea presyncope or syncope.  She notes a rare episode of shortness of breath when she gets very upset.  She denies any exertional dyspnea.  She has continued to be on atenolol 50 mg daily, losartan 25 mg twice a day.  Apparently she has  not been taking her rosuvastatin and actually never got this prescription filled.  She underwent repeat laboratory in September 2019 by [name].  LDL cholesterol was 113.   A follow-up echo Doppler study on May 27, 2018  showed normal EF of 55 to 60% with only mildly increased wall thickness.  There was suggestion of grade 2 diastolic dysfunction.  Her left atrial size was moderately dilated.  Visually it appeared that her aortic stenosis was in the moderate to moderately severe range.  Mean gradient was 31 with a peak instantaneous gradient of 55.   Since I last saw her, she was evaluated by [name] on March 22, 2019. She continues to be symptom-free. Her blood pressure was increased but reportedly her blood pressure at home had been normal. A blood pressure log was recommended.  She underwent a follow-up echo Doppler study on August 24, 2019 this showed an EF 55 to 60% with mild LVH and grade 1 diastolic dysfunction. There is mild dilation of her left atrium. There was mild mitral regurgitation. She was now felt to have severe aortic stenosis with a mean gradient of 40. Her peak instantaneous gradient was 66.3. Aortic valve area was 0.6 cm. Presently, she again denies any chest pain or any episodes of presyncope or syncope. When I interrogated her further, she perhaps does note some slight shortness of breath which she states she gets when she is worrying but denies any definitive exertional symptoms. She had undergone thyroidectomy in January 2021 by [name]. She has not had recent laboratory. She presents for evaluation.  Past Medical History:  Diagnosis Date  . Anxiety   . Aortic stenosis    documented on cardiac clearance note  . Arthritis   . Elevated lipoprotein A level 04/19/2019  . Heart murmur   . Hypercholesterolemia   . Hypertension   . Personal history of colonic polyps   . S/P thyroidectomy 03/30/2019  . Thyroid nodule    Bilateral    Past Surgical History:  Procedure  Laterality Date  . COLONOSCOPY  01/18/2014  . PARTIAL HYSTERECTOMY  1980  . POLYPECTOMY    . THYROIDECTOMY  03/30/2019  . THYROIDECTOMY N/A 03/30/2019   Procedure: TOTAL THYROIDECTOMY;  Surgeon: [name], MD;  [facility];  Service: General;  Laterality: N/A;    Current Medications: Outpatient Medications Prior to Visit  Medication Sig Dispense Refill  . atenolol (TENORMIN) 50 MG tablet Take 50 mg by mouth daily.    . calcium-vitamin D (OSCAL WITH D) 500-200 MG-UNIT tablet Take 2 tablets by mouth 3 (three) times daily. 90 tablet 0  . cholecalciferol (VITAMIN D3) 25 MCG (1000 UNIT) tablet Take 1,000 Units by mouth daily.    . Evolocumab (REPATHA SURECLICK) 010 MG/ML SOAJ Inject 140  mg into the skin every 14 (fourteen) days. 2 pen 11  . fluticasone (FLONASE) 50 MCG/ACT nasal spray Place 2 sprays into both nostrils daily.    Marland Kitchen levothyroxine (SYNTHROID) 112 MCG tablet Take 1 tablet (112 mcg total) by mouth daily. 30 tablet 11  . meclizine (ANTIVERT) 25 MG tablet Take 25 mg by mouth every 8 (eight) hours as needed.    . rosuvastatin (CRESTOR) 20 MG tablet Take 1 tablet (20 mg total) by mouth every evening. 90 tablet 3  . triamcinolone (NASACORT ALLERGY 24HR) 55 MCG/ACT AERO nasal inhaler Place 1 spray into the nose daily as needed (allergies).    . zolpidem (AMBIEN) 10 MG tablet Take 1 tablet by mouth as needed.    Marland Kitchen acetaminophen (TYLENOL) 650 MG CR tablet Take 650 mg by mouth every 8 (eight) hours as needed for pain.    Marland Kitchen escitalopram (LEXAPRO) 10 MG tablet Take 10 mg by mouth at bedtime.    Marland Kitchen losartan (COZAAR) 25 MG tablet Take 1 tablet (25 mg total) by mouth 2 (two) times daily. 180 tablet 0  . traMADol (ULTRAM) 50 MG tablet Take 1 tablet (50 mg total) by mouth every 6 (six) hours as needed (mild pain). 20 tablet 0   No facility-administered medications prior to visit.     Allergies:   Sulfonamide derivatives   Social History   Socioeconomic History  . Marital status:  Married    Spouse name: Not on file  . Number of children: Not on file  . Years of education: Not on file  . Highest education level: Not on file  Occupational History  . Not on file  Tobacco Use  . Smoking status: Never Smoker  . Smokeless tobacco: Never Used  Vaping Use  . Vaping Use: Never used  Substance and Sexual Activity  . Alcohol use: No  . Drug use: No  . Sexual activity: Not on file  Other Topics Concern  . Not on file  Social History Narrative  . Not on file   Social Determinants of Health   Financial Resource Strain:   . Difficulty of Paying Living Expenses:   Food Insecurity:   . Worried About Charity fundraiser in the Last Year:   . Arboriculturist in the Last Year:   Transportation Needs:   . Film/video editor (Medical):   Marland Kitchen Lack of Transportation (Non-Medical):   Physical Activity:   . Days of Exercise per Week:   . Minutes of Exercise per Session:   Stress:   . Feeling of Stress :   Social Connections:   . Frequency of Communication with Friends and Family:   . Frequency of Social Gatherings with Friends and Family:   . Attends Religious Services:   . Active Member of Clubs or Organizations:   . Attends Archivist Meetings:   Marland Kitchen Marital Status:     Additional social history is notable in that she is married for 50 years.  She has 5 children and 16 great and grandchildren.  She works for [employer].  She completed 12th grade of education.  There is no tobacco history or alcohol use.  Family History:  The patient's family history includes Pancreatic cancer in her mother.  Her mother died at 53 from pancreatic cancer.  Her father died at 83.  One brother had neurofibromatosis.  ROS General: Negative; No fevers, chills, or night sweats;  HEENT: Negative; No changes in vision or hearing, sinus congestion,  difficulty swallowing Pulmonary: Negative; No cough, wheezing, shortness of breath, hemoptysis Cardiovascular: Negative; No chest  pain, presyncope, syncope, palpitations GI: Negative; No nausea, vomiting, diarrhea, or abdominal pain GU: Negative; No dysuria, hematuria, or difficulty voiding Musculoskeletal: Positive for osteopenia Hematologic/Oncology: Negative; no easy bruising, bleeding Endocrine: Negative; no heat/cold intolerance; no diabetes Neuro: Negative; no changes in balance, headaches Skin: Negative; No rashes or skin lesions Psychiatric: Negative; No behavioral problems, depression Sleep: Negative; No snoring, daytime sleepiness, hypersomnolence, bruxism, restless legs, hypnogognic hallucinations, no cataplexy Other comprehensive 14 point system review is negative.   PHYSICAL EXAM:   VS:  BP (!) 132/80 (BP Location: Left Arm, Patient Position: Sitting, Cuff Size: Normal)   Pulse 63   Ht 5' 4"  (1.626 m)   Wt 162 lb (73.5 kg)   BMI 27.81 kg/m     Repeat blood pressure was 160/84 when taken by me  Wt Readings from Last 3 Encounters:  10/13/19 162 lb (73.5 kg)  05/20/19 161 lb (73 kg)  05/09/19 161 lb (73 kg)    General: Alert, oriented, no distress.  Skin: normal turgor, no rashes, warm and dry HEENT: Normocephalic, atraumatic. Pupils equal round and reactive to light; sclera anicteric; extraocular muscles intact;  Nose without nasal septal hypertrophy Mouth/Parynx benign; Mallinpatti scale 2 Neck: No JVD, no carotid bruits; normal carotid upstroke Lungs: clear to ausculatation and percussion; no wheezing or rales Chest wall: without tenderness to palpitation Heart: PMI not displaced, RRR, s1 s2 normal, 2/6 mid peaking systolic murmur in the aortic area, no diastolic murmur, no rubs, gallops, thrills, or heaves Abdomen: soft, nontender; no hepatosplenomehaly, BS+; abdominal aorta nontender and not dilated by palpation. Back: no CVA tenderness Pulses 2+ Musculoskeletal: full range of motion, normal strength, no joint deformities Extremities: no clubbing cyanosis or edema, Homan's sign negative   Neurologic: grossly nonfocal; Cranial nerves grossly wnl Psychologic: Normal mood and affect    Studies/Labs Reviewed:   EKG:  EKG is ordered today.  Normal sinus rhythm at 63 bpm.  No ectopy.  No LVH or strain.  July 2020 ECG (independently read by me): Sinus Bradycardia at 58; no ectopy, LVH or strain  September 2019 ECG (independently read by me): Normal sinus rhythm at 70 bpm.  No ectopy. No LVH or strain  February 2019 ECG (independently read by me): Sinus bradycardia 58 bpm.  PR interval 172 ms.  QTc interval 426 ms.  No ectopy.  October 2018 ECG (independently read by me): Normal sinus rhythm at 68 bpm.  Normal intervals.  No ST segment changes.  No ectopy.  Recent Labs: BMP Latest Ref Rng & Units 10/13/2019 03/31/2019 03/30/2019  Glucose 65 - 99 mg/dL 105(H) 133(H) 150(H)  BUN 8 - 27 mg/dL 16 14 13   Creatinine 0.57 - 1.00 mg/dL 0.77 0.78 0.76  BUN/Creat Ratio 12 - 28 21 - -  Sodium 134 - 144 mmol/L 140 139 137  Potassium 3.5 - 5.2 mmol/L 4.8 4.5 4.0  Chloride 96 - 106 mmol/L 104 103 103  CO2 20 - 29 mmol/L 22 27 25   Calcium 8.7 - 10.3 mg/dL 9.2 9.7 8.8(L)     Hepatic Function Latest Ref Rng & Units 10/13/2019 03/09/2019  Total Protein 6.0 - 8.5 g/dL 6.5 6.6  Albumin 3.7 - 4.7 g/dL 4.1 4.3  AST 0 - 40 IU/L 15 14  ALT 0 - 32 IU/L 12 11  Alk Phosphatase 48 - 121 IU/L 66 63  Total Bilirubin 0.0 - 1.2 mg/dL 0.5 0.6  CBC Latest Ref Rng & Units 10/13/2019 03/30/2019  WBC 3.4 - 10.8 x10E3/uL 6.2 6.6  Hemoglobin 11.1 - 15.9 g/dL 12.0 12.0  Hematocrit 34.0 - 46.6 % 35.8 36.0  Platelets 150 - 450 x10E3/uL 218 247   Lab Results  Component Value Date   MCV 89 10/13/2019   MCV 89.3 03/30/2019   Lab Results  Component Value Date   TSH 0.025 (L) 10/13/2019   No results found for: HGBA1C   BNP No results found for: BNP  ProBNP No results found for: PROBNP   Lipid Panel     Component Value Date/Time   CHOL 94 (L) 10/13/2019 0858   TRIG 78 10/13/2019 0858    HDL 49 10/13/2019 0858   CHOLHDL 1.9 10/13/2019 0858   LDLCALC 29 10/13/2019 0858     RADIOLOGY: No results found.   Additional studies/ records that were reviewed today include:  I reviewed the medical records from [place] including the echo Doppler study.  Office visits, ECG, chest x-ray, and laboratory.  Glucose 108, BUN 13, Cr 0.8. We will leave that there letter daddies a creatinine of he wanted get this stuff so he does not stop right Cholesterol 187, triglycerides 105, HDL 44, LDL 122.  Non-HDL 43. TSH 0.21 Vitamin D 27.6  Laboratory at [facility] on December 14, 2017, LP(a) 180, BUN 16 creatinine 0.8.  Total cholesterol 182, triglycerides 111, HDL 47, LDL 113.  Non-HDL 135.  TSH 0.08.  Vitamin D 25.4.   ASSESSMENT:    1. Nonrheumatic aortic valve stenosis   2. Essential hypertension   3. Elevated lipoprotein(a)   4. Hyperlipidemia with target LDL less than 70   5. Dilated aortic root ([place])   6. Thoracic aortic aneurysm without rupture ([place])   7. Grade I diastolic dysfunction     PLAN:  Joann Barry is a very pleasant 74 year old female who was initally referred by [name] for evaluation of cardiac murmur.  Her cardiac murmur is concordant with aortic valve stenosis and her initial echocardiographic evaluation in September 2018 demonstrated normal systolic function with grade 1 diastolic dysfunction and moderate aortic stenosis with a mean gradient of 28, peak instantaneous gradient to 45 mm Hg. in addition, there was poststenotic aortic root dilatation at 4.5 cm.  When I initially saw her, she was completely asymptomatic with reference to chest pain, PND, orthopnea, presyncope or syncope and did not have any signs or symptoms of CHF.  She was hypertensive.  With the addition of losartan 25 mg once daily her blood pressure has improved but remained elevated and subsequently was further titrated to 25 mg twice a day losartan was increased to 25 mg twice  a day.  Since there is a reported increased incidence of increased LP(a) with aortic stenosis, I obtained an LP(a) evaluation which proved to be significantly elevated at 180. When subsequently seen her LDL cholesterol was 113.  I had recommended initiation of rosuvastatin which she never filled. At her last office visit she ultimately agreed to initiate therapy and rosuvastatin was started at 10 mg and titrated up to 20 mg daily. In the future there will be specific therapy potentially aimed at reducing LP(a).  PCSK9 inhibitors have been shown to reduce this level by approximately 26%. Her most recent echo Doppler study done in June 2021 has shown further increase in the severity of her aortic stenosis compared to her last echo with a mean has now increased from 31 up to 40 mmHg  and the peak instantaneous gradient from 55 up to 66. She continues to be fairly asymptomatic but now admits to shortness of breath when she gets anxious and worried. However she denies any shortness of breath with walking and denies palpitations presyncope or syncope. Her blood pressure today is elevated. I have suggested slight titration of losartan from 50 mg up to 50 mg in the morning and 25 mg at night. I am recommending follow-up laboratory with this comprehensive metabolic panel, CBC, TSH, lipid studies, and follow-up LP(a). I had a long discussion with her today. I have recommended that she see Dr. Sherren Mocha in our group for further evaluation. This will be helpful to assess her candidacy for TAVR therapy and will defer to him whether or not the timing is right for continued close surveillance or TAVR imaging studies and cardiac catheterization.   Medication Adjustments/Labs and Tests Ordered: Current medicines are reviewed at length with the patient today.  Concerns regarding medicines are outlined above.  Medication changes, Labs and Tests ordered today are listed in the Patient Instructions below. Patient Instructions   Medication Instructions:  CHANGE THE WAY YOU ARE TAKING YOUR LOSARTAN- TAKE 50MG IN THE MORNING AND 25MG AT NIGHT  *If you need a refill on your cardiac medications before your next appointment, please call your pharmacy*   Lab Work: Fasting labs: cmet Cbc tsh Lipid Lpa  If you have labs (blood work) drawn today and your tests are completely normal, you will receive your results only by: Marland Kitchen MyChart Message (if you have MyChart) OR . A paper copy in the mail If you have any lab test that is abnormal or we need to change your treatment, we will call you to review the results.   Follow-Up: Based upon Dr.Cooper's assessment  Other Instructions Ambulatory referral to Dr. Sherren Mocha     Signed, [name], MD  10/15/2019 [facility] [address], [place], [place], [place]  [address] Phone: [phone]

## 2019-10-14 LAB — CBC
Hematocrit: 35.8 % (ref 34.0–46.6)
Hemoglobin: 12 g/dL (ref 11.1–15.9)
MCH: 29.8 pg (ref 26.6–33.0)
MCHC: 33.5 g/dL (ref 31.5–35.7)
MCV: 89 fL (ref 79–97)
Platelets: 218 10*3/uL (ref 150–450)
RBC: 4.03 x10E6/uL (ref 3.77–5.28)
RDW: 12.8 % (ref 11.7–15.4)
WBC: 6.2 10*3/uL (ref 3.4–10.8)

## 2019-10-14 LAB — LIPID PANEL
Chol/HDL Ratio: 1.9 ratio (ref 0.0–4.4)
Cholesterol, Total: 94 mg/dL — ABNORMAL LOW (ref 100–199)
HDL: 49 mg/dL (ref >39)
LDL Chol Calc (NIH): 29 mg/dL (ref 0–99)
Triglycerides: 78 mg/dL (ref 0–149)
VLDL Cholesterol Cal: 16 mg/dL (ref 5–40)

## 2019-10-14 LAB — COMPREHENSIVE METABOLIC PANEL
ALT: 12 IU/L (ref 0–32)
AST: 15 IU/L (ref 0–40)
Albumin/Globulin Ratio: 1.7 (ref 1.2–2.2)
Albumin: 4.1 g/dL (ref 3.7–4.7)
Alkaline Phosphatase: 66 IU/L (ref 48–121)
BUN/Creatinine Ratio: 21 (ref 12–28)
BUN: 16 mg/dL (ref 8–27)
Bilirubin Total: 0.5 mg/dL (ref 0.0–1.2)
CO2: 22 mmol/L (ref 20–29)
Calcium: 9.2 mg/dL (ref 8.7–10.3)
Chloride: 104 mmol/L (ref 96–106)
Creatinine, Ser: 0.77 mg/dL (ref 0.57–1.00)
GFR calc Af Amer: 88 mL/min/{1.73_m2} (ref >59)
GFR calc non Af Amer: 76 mL/min/{1.73_m2} (ref >59)
Globulin, Total: 2.4 g/dL (ref 1.5–4.5)
Glucose: 105 mg/dL — ABNORMAL HIGH (ref 65–99)
Potassium: 4.8 mmol/L (ref 3.5–5.2)
Sodium: 140 mmol/L (ref 134–144)
Total Protein: 6.5 g/dL (ref 6.0–8.5)

## 2019-10-14 LAB — LIPOPROTEIN A (LPA): Lipoprotein (a): 220.2 nmol/L — ABNORMAL HIGH (ref <75.0)

## 2019-10-14 LAB — TSH: TSH: 0.025 u[IU]/mL — ABNORMAL LOW (ref 0.450–4.500)

## 2019-10-15 ENCOUNTER — Encounter: Payer: Self-pay | Admitting: Cardiovascular Disease

## 2019-10-18 ENCOUNTER — Other Ambulatory Visit: Payer: Self-pay

## 2019-10-18 ENCOUNTER — Encounter: Payer: Self-pay | Admitting: Cardiovascular Disease

## 2019-10-18 ENCOUNTER — Ambulatory Visit: Payer: Medicare Other | Admitting: Cardiovascular Disease

## 2019-10-18 VITALS — BP 160/90 | HR 59 | Ht 64.5 in | Wt 160.8 lb

## 2019-10-18 DIAGNOSIS — I77810 Thoracic aortic ectasia: Secondary | ICD-10-CM

## 2019-10-18 DIAGNOSIS — I350 Nonrheumatic aortic (valve) stenosis: Secondary | ICD-10-CM

## 2019-10-18 NOTE — Progress Notes (Signed)
Cardiology Office Note:    Date:  10/18/2019   ID:  Joann Barry, DOB Apr 21, 1945, MRN [id number]  PCP:  [name], MD  [facility] Cardiologist:  [name], MD  [place] Electrophysiologist:  None   Referring MD: [name], MD   Chief Complaint  Patient presents with  . Aortic Stenosis    History of Present Illness:    Joann Barry is a 74 y.o. female referred for evaluation of aortic stenosis by Dr [name].   The patient is here alone today. She first remembers being told of a heart murmur in 2018 by Dr [name], referred to Dr [name] at that time.   In 2018 her first echo showed peak and mean gradients of 45 and 28 mmHg, respectively with a dimensionless index of 0.24. .In 2019 gradients had increased to 61 and 34 mmHg with a DI of 0.19.  The recent study shows peak and mean gradients of 66 and 40 mmHg, respectively, with a DI of 0.16 and calculated AVA of 0.5 square cm.   Overall she feels well. She remains active with her daily life, but doesn't engage in exercise. She has occasional shortness of breath when she feels anxious or stressed. She feels fatigued with carrying groceries. She denies chest pain or pressure. No leg swelling, orthopnea, or PND. No palpitations, lightheadedness, or syncope. 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  She has no history of rheumatic fever.  She has no history of heart murmur as a young child.  The patient has never undergone cardiac catheterization.  The patient denies any problems with her teeth or gums, and she sees a dentist every 6 months.    Past Medical History:  Diagnosis Date  . Anxiety   . Aortic stenosis    documented on cardiac clearance note  . Arthritis   . Elevated lipoprotein A level 04/19/2019  . Heart murmur   . Hypercholesterolemia   . Hypertension   . Personal history of colonic polyps   . S/P thyroidectomy 03/30/2019  . Thyroid nodule     Bilateral    Past Surgical History:  Procedure Laterality Date  . COLONOSCOPY  01/18/2014  . PARTIAL HYSTERECTOMY  1980  . POLYPECTOMY    . THYROIDECTOMY  03/30/2019  . THYROIDECTOMY N/A 03/30/2019   Procedure: TOTAL THYROIDECTOMY;  Surgeon: [name], MD;  Location: [place];  Service: General;  Laterality: N/A;    Current Medications: Current Meds  Medication Sig  . atenolol (TENORMIN) 50 MG tablet Take 50 mg by mouth daily.  . cholecalciferol (VITAMIN D3) 25 MCG (1000 UNIT) tablet Take 1,000 Units by mouth in the morning and at bedtime.   . Evolocumab (REPATHA SURECLICK) 631 MG/ML SOAJ Inject 140 mg into the skin every 14 (fourteen) days.  . fluticasone (FLONASE) 50 MCG/ACT nasal spray Place 2 sprays into both nostrils daily as needed for allergies.   Marland Kitchen levothyroxine (SYNTHROID) 112 MCG tablet Take 1 tablet (112 mcg total) by mouth daily.  Marland Kitchen losartan (COZAAR) 50 MG tablet Take 50mg  (1 tab)  in the AM and 25mg  (1/2 tab) in the PM (Patient taking differently: Take 25-50 mg by mouth See admin instructions. Take 50mg  in the AM and 25mg   in the PM)  . meclizine (ANTIVERT) 25 MG tablet Take 25 mg by mouth every 8 (eight) hours as needed for dizziness.   . Multiple Vitamins-Minerals (CENTRUM SILVER 50+WOMEN PO) Take 1 tablet by mouth daily.   Marland Kitchen  rosuvastatin (CRESTOR) 20 MG tablet Take 1 tablet (20 mg total) by mouth every evening.  . zolpidem (AMBIEN) 10 MG tablet Take 5 mg by mouth at bedtime as needed for sleep.   . [DISCONTINUED] triamcinolone (NASACORT ALLERGY 24HR) 55 MCG/ACT AERO nasal inhaler Place 1 spray into the nose daily as needed (allergies).     Allergies:   Sulfonamide derivatives   Social History   Socioeconomic History  . Marital status: Married    Spouse name: Not on file  . Number of children: Not on file  . Years of education: Not on file  . Highest education level: Not on file  Occupational History  . Not on file  Tobacco Use  . Smoking status: Never  Smoker  . Smokeless tobacco: Never Used  Vaping Use  . Vaping Use: Never used  Substance and Sexual Activity  . Alcohol use: No  . Drug use: No  . Sexual activity: Not on file  Other Topics Concern  . Not on file  Social History Narrative  . Not on file   Social Determinants of Health   Financial Resource Strain:   . Difficulty of Paying Living Expenses:   Food Insecurity:   . Worried About Charity fundraiser in the Last Year:   . Arboriculturist in the Last Year:   Transportation Needs:   . Film/video editor (Medical):   Marland Kitchen Lack of Transportation (Non-Medical):   Physical Activity:   . Days of Exercise per Week:   . Minutes of Exercise per Session:   Stress:   . Feeling of Stress :   Social Connections:   . Frequency of Communication with Friends and Family:   . Frequency of Social Gatherings with Friends and Family:   . Attends Religious Services:   . Active Member of Clubs or Organizations:   . Attends Archivist Meetings:   Marland Kitchen Marital Status:      Family History: The patient's family history includes Pancreatic cancer in her mother. There is no history of Colon cancer, Rectal cancer, Stomach cancer, Esophageal cancer, or Colon polyps.  ROS:   Please see the history of present illness.    All other systems reviewed and are negative.  EKGs/Labs/Other Studies Reviewed:    The following studies were reviewed today: Echo 08/24/2019: FINDINGS  Left Ventricle: Left ventricular ejection fraction, by estimation, is 55  to 60%. The left ventricle has normal function. The left ventricle has no  regional wall motion abnormalities. The average left ventricular global  longitudinal strain is -16.7 %.  The left ventricular internal cavity size was normal in size. There is  mild left ventricular hypertrophy. Left ventricular diastolic parameters  are consistent with Grade I diastolic dysfunction (impaired relaxation).   Right Ventricle: The right ventricular  size is normal. No increase in  right ventricular wall thickness. Right ventricular systolic function is  normal.   Left Atrium: Left atrial size was mildly dilated.   Right Atrium: Right atrial size was normal in size.   Pericardium: There is no evidence of pericardial effusion.   Mitral Valve: The mitral valve is normal in structure. Normal mobility of  the mitral valve leaflets. Mild mitral valve regurgitation. No evidence of  mitral valve stenosis.   Tricuspid Valve: The tricuspid valve is normal in structure. Tricuspid  valve regurgitation is not demonstrated. No evidence of tricuspid  stenosis.   Aortic Valve: The aortic valve is normal in structure.. There is severe  thickening and severe calcifcation of the aortic valve. Aortic valve  regurgitation is moderate. Aortic regurgitation PHT measures 488 msec.  Severe aortic stenosis is present. There  is severe thickening of the aortic valve. There is severe calcifcation of  the aortic valve. Aortic valve mean gradient measures 40.0 mmHg. Aortic  valve peak gradient measures 66.3 mmHg. Aortic valve area, by VTI measures  0.51 cm.   Pulmonic Valve: The pulmonic valve was normal in structure. Pulmonic valve  regurgitation is trivial. No evidence of pulmonic stenosis.   Aorta: Aortic dilatation noted. There is moderate dilatation of the  ascending aorta measuring 44 mm.   Venous: The inferior vena cava is normal in size with greater than 50%  respiratory variability, suggesting right atrial pressure of 3 mmHg.   IAS/Shunts: No atrial level shunt detected by color flow Doppler.     LEFT VENTRICLE  PLAX 2D  LVIDd:     4.83 cm Diastology  LVIDs:     3.53 cm LV e' lateral:  5.87 cm/s  LV PW:     1.51 cm LV E/e' lateral: 11.4  LV IVS:    1.31 cm LV e' medial:  4.46 cm/s  LVOT diam:   2.00 cm LV E/e' medial: 15.0  LV SV:     60  LV SV Index:  34    2D Longitudinal Strain  LVOT Area:    3.14 cm 2D Strain GLS Avg:   -16.7 %     RIGHT VENTRICLE  RV Basal diam: 2.61 cm  RV S prime:   9.03 cm/s  TAPSE (M-mode): 2.1 cm   LEFT ATRIUM       Index    RIGHT ATRIUM      Index  LA diam:    3.90 cm 2.17 cm/m RA Area:   15.20 cm  LA Vol (A2C):  78.6 ml 43.82 ml/m RA Volume:  34.70 ml 19.35 ml/m  LA Vol (A4C):  38.2 ml 21.30 ml/m  LA Biplane Vol: 60.2 ml 33.56 ml/m  AORTIC VALVE  AV Area (Vmax):  0.55 cm  AV Area (Vmean):  0.46 cm  AV Area (VTI):   0.51 cm  AV Vmax:      407.00 cm/s  AV Vmean:     300.000 cm/s  AV VTI:      1.190 m  AV Peak Grad:   66.3 mmHg  AV Mean Grad:   40.0 mmHg  LVOT Vmax:     70.90 cm/s  LVOT Vmean:    43.700 cm/s  LVOT VTI:     0.192 m  LVOT/AV VTI ratio: 0.16  AI PHT:      488 msec    AORTA  Ao Root diam: 2.90 cm   MITRAL VALVE  MV Area (PHT):        SHUNTS  MV Decel Time:        Systemic VTI: 0.19 m  MR Peak grad:  115.3 mmHg Systemic Diam: 2.00 cm  MR Mean grad:  75.0 mmHg  MR Vmax:     537.00 cm/s  MR Vmean:    401.0 cm/s  MR PISA:     1.01 cm  MR PISA Eff ROA: 6 mm  MR PISA Radius: 0.40 cm  MV E velocity: 66.90 cm/s  MV A velocity: 77.10 cm/s  MV E/A ratio: 0.87   EKG:  EKG is ordered today.  The ekg ordered today demonstrates NSR 63 bpm, no significant ST-T changes  Recent Labs: 03/31/2019: Magnesium 1.9  10/13/2019: ALT 12; BUN 16; Creatinine, Ser 0.77; Hemoglobin 12.0; Platelets 218; Potassium 4.8; Sodium 140; TSH 0.025  Recent Lipid Panel    Component Value Date/Time   CHOL 94 (L) 10/13/2019 0858   TRIG 78 10/13/2019 0858   HDL 49 10/13/2019 0858   CHOLHDL 1.9 10/13/2019 0858   LDLCALC 29 10/13/2019 0858    Physical Exam:    VS:  BP (!) 160/90   Pulse (!) 59   Ht 5' 4.5" (1.638 m)   Wt 160 lb 12.8 oz (72.9 kg)   SpO2 97%   BMI 27.17 kg/m     Wt Readings from Last 3 Encounters:  10/18/19  160 lb 12.8 oz (72.9 kg)  10/13/19 162 lb (73.5 kg)  05/20/19 161 lb (73 kg)     GEN:  Well nourished, well developed in no acute distress HEENT: Normal NECK: No JVD; No carotid bruits LYMPHATICS: No lymphadenopathy CARDIAC: RRR, 3/6 harsh late peaking systolic murmur at the right upper sternal border, A2 present but diminished, no diastolic murmur RESPIRATORY:  Clear to auscultation without rales, wheezing or rhonchi  ABDOMEN: Soft, non-tender, non-distended MUSCULOSKELETAL:  No edema; No deformity  SKIN: Warm and dry NEUROLOGIC:  Alert and oriented x 3 PSYCHIATRIC:  Normal affect   STS Risk Calculator: Isolated AVR -  Risk of Mortality: 1.309% Renal Failure: 1.153% Permanent Stroke: 1.276% Prolonged Ventilation: 4.288% DSW Infection: 0.057% Reoperation: 3.360% Morbidity or Mortality: 7.816% Short Length of Stay: 42.430% Long Length of Stay: 3.522%  ASSESSMENT:    1. Nonrheumatic aortic valve stenosis   2. Dilated aortic root (HCC)    PLAN:    In order of problems listed above:  1. The patient has severe, stage D1 aortic stenosis with New [place] Heart Association functional class II symptoms of fatigue and dyspnea that occurs with emotional stress.  I have personally reviewed her most recent echo images and compared her study to serial studies over the past few years.  As noted in the HPI, she has progressively severe aortic stenosis, now with peak and mean gradients of 66 and 40 mmHg, respectively.  The peak transaortic velocity now exceeds 4 m/s and a dimensionless index is 0.16.  She has mild aortic valve insufficiency and mild mitral insufficiency.  She may have a functionally bicuspid valve.  LV function is normal with LVEF approximately 60% and at least mild concentric LVH.  I have reviewed the natural history of aortic stenosis with the patient today. We have discussed the limitations of medical therapy and the poor prognosis associated with symptomatic aortic  stenosis. We have reviewed potential treatment options, including palliative medical therapy, conventional surgical aortic valve replacement, and transcatheter aortic valve replacement. We discussed treatment options in the context of the patient's specific comorbid medical conditions.  She understands that she will require further studies to determine her best treatment option.  She has been noted to have a dilated ascending aorta of approximately 4.4 cm in maximal diameter.  She will undergo a gated cardiac CTA as well as a CTA of the chest, abdomen, and pelvis.  This study will most accurately evaluate the size of her ascending aorta.  This will also demonstrate the cardiac anatomy and determine whether she might be a candidate for TAVR.  I have also recommended that she undergo right and left heart catheterization with coronary angiography to evaluate for the presence of obstructive coronary artery disease.  The procedure will be scheduled with her primary cardiologist, Dr. [name]. I have reviewed  the risks, indications, and alternatives to cardiac catheterization, possible angioplasty, and stenting with the patient. Risks include but are not limited to bleeding, infection, vascular injury, stroke, myocardial infection, arrhythmia, kidney injury, radiation-related injury in the case of prolonged fluoroscopy use, emergency cardiac surgery, and death. The patient understands the risks of serious complication is 1-2 in 8250 with diagnostic cardiac cath and 1-2% or less with angioplasty/stenting.  After completion of her preoperative studies, the patient will be referred for formal cardiac surgical consultation as part of a multidisciplinary heart team evaluation.  In the interim the patient is advised to avoid any strenuous activity.   Medication Adjustments/Labs and Tests Ordered: Current medicines are reviewed at length with the patient today.  Concerns regarding medicines are outlined above.  Orders Placed  This Encounter  Procedures  . CT CORONARY MORPH W/CTA COR W/SCORE W/CA W/CM &/OR WO/CM  . CT ANGIO ABDOMEN PELVIS  W &/OR WO CONTRAST  . CT ANGIO CHEST AORTA W/CM & OR WO/CM  . VAS US CAROTID   No orders of the defined types were placed in this encounter.   Patient Instructions  CATHETERIZATION INSTRUCTIONS: You are scheduled for a Cardiac Catheterization on: 10/28/2019 with Dr. [name].  1. Please arrive at the [facility] (Main Entrance A) at [facility]: [address] [place], [place] [address] at: 7:00AM (This time is two hours before your procedure to ensure your preparation). Free valet parking service is available. You are allowed ONE visitor in the waiting room during your procedure. Both you and your guest must wear masks. Special note: Every effort is made to have your procedure done on time. Please understand that emergencies sometimes delay scheduled procedures.  2. Diet: Do not eat solid foods after midnight.  You may have clear liquids until 5am upon the day of the procedure.  3. Medication instructions in preparation for your procedure:  1) TAKE ASPIRIN 81 mg the morning of your catheterization  2) You may take your other medications as instructed with sips of water  4. Plan for one night stay--bring personal belongings. 5. Bring a current list of your medications and current insurance cards. 6. You MUST have a responsible person to drive you home. 7. Someone MUST be with you the first 24 hours after you arrive home or your discharge will be delayed. 8. Please wear clothes that are easy to get on and off and wear slip-on shoes.  Thank you for allowing [place] to care for you!   -- [facility] Invasive Cardiovascular services      Signed, Sherren Mocha, MD  10/18/2019 5:13 PM    [place]

## 2019-10-18 NOTE — Patient Instructions (Addendum)
CATHETERIZATION INSTRUCTIONS: You are scheduled for a Cardiac Catheterization on: 10/28/2019 with Dr. [name].  1. Please arrive at the [facility] (Main Entrance A) at [facility]: [address] [place], [place] [address] at: 7:00AM (This time is two hours before your procedure to ensure your preparation). Free valet parking service is available. You are allowed ONE visitor in the waiting room during your procedure. Both you and your guest must wear masks. Special note: Every effort is made to have your procedure done on time. Please understand that emergencies sometimes delay scheduled procedures.  2. Diet: Do not eat solid foods after midnight.  You may have clear liquids until 5am upon the day of the procedure.  3. Medication instructions in preparation for your procedure:  1) TAKE ASPIRIN 81 mg the morning of your catheterization  2) You may take your other medications as instructed with sips of water  4. Plan for one night stay--bring personal belongings. 5. Bring a current list of your medications and current insurance cards. 6. You MUST have a responsible person to drive you home. 7. Someone MUST be with you the first 24 hours after you arrive home or your discharge will be delayed. 8. Please wear clothes that are easy to get on and off and wear slip-on shoes.  Thank you for allowing [place] to care for you!   -- [place] In[facility] Cardiovascular services

## 2019-10-18 NOTE — H&P (View-Only) (Signed)
Cardiology Office Note:    Date:  10/18/2019   ID:  Joann Barry, DOB [birth year], MRN [id number]  PCP:  [name], MD  [facility] Cardiologist:  [name], MD  [place] Electrophysiologist:  None   Referring MD: [name], MD   Chief Complaint  Patient presents with  . Aortic Stenosis    History of Present Illness:    Joann Barry is a 74 y.o. female referred for evaluation of aortic stenosis by Dr [name].   The patient is here alone today. She first remembers being told of a heart murmur in 2018 by Dr [name], referred to Dr [name] at that time.   In 2018 her first echo showed peak and mean gradients of 45 and 28 mmHg, respectively with a dimensionless index of 0.24. .In 2019 gradients had increased to 61 and 34 mmHg with a DI of 0.19.  The recent study shows peak and mean gradients of 66 and 40 mmHg, respectively, with a DI of 0.16 and calculated AVA of 0.5 square cm.   Overall she feels well. She remains active with her daily life, but doesn't engage in exercise. She has occasional shortness of breath when she feels anxious or stressed. She feels fatigued with carrying groceries. She denies chest pain or pressure. No leg swelling, orthopnea, or PND. No palpitations, lightheadedness, or syncope. 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  She has no history of rheumatic fever.  She has no history of heart murmur as a young child.  The patient has never undergone cardiac catheterization.  The patient denies any problems with her teeth or gums, and she sees a dentist every 6 months.    Past Medical History:  Diagnosis Date  . Anxiety   . Aortic stenosis    documented on cardiac clearance note  . Arthritis   . Elevated lipoprotein A level 04/19/2019  . Heart murmur   . Hypercholesterolemia   . Hypertension   . Personal history of colonic polyps   . S/P thyroidectomy 03/30/2019  . Thyroid nodule     Bilateral    Past Surgical History:  Procedure Laterality Date  . COLONOSCOPY  01/18/2014  . PARTIAL HYSTERECTOMY  1980  . POLYPECTOMY    . THYROIDECTOMY  03/30/2019  . THYROIDECTOMY N/A 03/30/2019   Procedure: TOTAL THYROIDECTOMY;  Surgeon: [name], MD;  Location: [place];  Service: General;  Laterality: N/A;    Current Medications: Current Meds  Medication Sig  . atenolol (TENORMIN) 50 MG tablet Take 50 mg by mouth daily.  . cholecalciferol (VITAMIN D3) 25 MCG (1000 UNIT) tablet Take 1,000 Units by mouth in the morning and at bedtime.   . Evolocumab (REPATHA SURECLICK) 324 MG/ML SOAJ Inject 140 mg into the skin every 14 (fourteen) days.  . fluticasone (FLONASE) 50 MCG/ACT nasal spray Place 2 sprays into both nostrils daily as needed for allergies.   Marland Kitchen levothyroxine (SYNTHROID) 112 MCG tablet Take 1 tablet (112 mcg total) by mouth daily.  Marland Kitchen losartan (COZAAR) 50 MG tablet Take 50mg  (1 tab)  in the AM and 25mg  (1/2 tab) in the PM (Patient taking differently: Take 25-50 mg by mouth See admin instructions. Take 50mg  in the AM and 25mg   in the PM)  . meclizine (ANTIVERT) 25 MG tablet Take 25 mg by mouth every 8 (eight) hours as needed for dizziness.   . Multiple Vitamins-Minerals (CENTRUM SILVER 50+WOMEN PO) Take 1 tablet by mouth daily.   Marland Kitchen  rosuvastatin (CRESTOR) 20 MG tablet Take 1 tablet (20 mg total) by mouth every evening.  . zolpidem (AMBIEN) 10 MG tablet Take 5 mg by mouth at bedtime as needed for sleep.   . [DISCONTINUED] triamcinolone (NASACORT ALLERGY 24HR) 55 MCG/ACT AERO nasal inhaler Place 1 spray into the nose daily as needed (allergies).     Allergies:   Sulfonamide derivatives   Social History   Socioeconomic History  . Marital status: Married    Spouse name: Not on file  . Number of children: Not on file  . Years of education: Not on file  . Highest education level: Not on file  Occupational History  . Not on file  Tobacco Use  . Smoking status: Never  Smoker  . Smokeless tobacco: Never Used  Vaping Use  . Vaping Use: Never used  Substance and Sexual Activity  . Alcohol use: No  . Drug use: No  . Sexual activity: Not on file  Other Topics Concern  . Not on file  Social History Narrative  . Not on file   Social Determinants of Health   Financial Resource Strain:   . Difficulty of Paying Living Expenses:   Food Insecurity:   . Worried About Charity fundraiser in the Last Year:   . Arboriculturist in the Last Year:   Transportation Needs:   . Film/video editor (Medical):   Marland Kitchen Lack of Transportation (Non-Medical):   Physical Activity:   . Days of Exercise per Week:   . Minutes of Exercise per Session:   Stress:   . Feeling of Stress :   Social Connections:   . Frequency of Communication with Friends and Family:   . Frequency of Social Gatherings with Friends and Family:   . Attends Religious Services:   . Active Member of Clubs or Organizations:   . Attends Archivist Meetings:   Marland Kitchen Marital Status:      Family History: The patient's family history includes Pancreatic cancer in her mother. There is no history of Colon cancer, Rectal cancer, Stomach cancer, Esophageal cancer, or Colon polyps.  ROS:   Please see the history of present illness.    All other systems reviewed and are negative.  EKGs/Labs/Other Studies Reviewed:    The following studies were reviewed today: Echo 08/24/2019: FINDINGS  Left Ventricle: Left ventricular ejection fraction, by estimation, is 55  to 60%. The left ventricle has normal function. The left ventricle has no  regional wall motion abnormalities. The average left ventricular global  longitudinal strain is -16.7 %.  The left ventricular internal cavity size was normal in size. There is  mild left ventricular hypertrophy. Left ventricular diastolic parameters  are consistent with Grade I diastolic dysfunction (impaired relaxation).   Right Ventricle: The right ventricular  size is normal. No increase in  right ventricular wall thickness. Right ventricular systolic function is  normal.   Left Atrium: Left atrial size was mildly dilated.   Right Atrium: Right atrial size was normal in size.   Pericardium: There is no evidence of pericardial effusion.   Mitral Valve: The mitral valve is normal in structure. Normal mobility of  the mitral valve leaflets. Mild mitral valve regurgitation. No evidence of  mitral valve stenosis.   Tricuspid Valve: The tricuspid valve is normal in structure. Tricuspid  valve regurgitation is not demonstrated. No evidence of tricuspid  stenosis.   Aortic Valve: The aortic valve is normal in structure.. There is severe  thickening and severe calcifcation of the aortic valve. Aortic valve  regurgitation is moderate. Aortic regurgitation PHT measures 488 msec.  Severe aortic stenosis is present. There  is severe thickening of the aortic valve. There is severe calcifcation of  the aortic valve. Aortic valve mean gradient measures 40.0 mmHg. Aortic  valve peak gradient measures 66.3 mmHg. Aortic valve area, by VTI measures  0.51 cm.   Pulmonic Valve: The pulmonic valve was normal in structure. Pulmonic valve  regurgitation is trivial. No evidence of pulmonic stenosis.   Aorta: Aortic dilatation noted. There is moderate dilatation of the  ascending aorta measuring 44 mm.   Venous: The inferior vena cava is normal in size with greater than 50%  respiratory variability, suggesting right atrial pressure of 3 mmHg.   IAS/Shunts: No atrial level shunt detected by color flow Doppler.     LEFT VENTRICLE  PLAX 2D  LVIDd:     4.83 cm Diastology  LVIDs:     3.53 cm LV e' lateral:  5.87 cm/s  LV PW:     1.51 cm LV E/e' lateral: 11.4  LV IVS:    1.31 cm LV e' medial:  4.46 cm/s  LVOT diam:   2.00 cm LV E/e' medial: 15.0  LV SV:     60  LV SV Index:  34    2D Longitudinal Strain  LVOT Area:    3.14 cm 2D Strain GLS Avg:   -16.7 %     RIGHT VENTRICLE  RV Basal diam: 2.61 cm  RV S prime:   9.03 cm/s  TAPSE (M-mode): 2.1 cm   LEFT ATRIUM       Index    RIGHT ATRIUM      Index  LA diam:    3.90 cm 2.17 cm/m RA Area:   15.20 cm  LA Vol (A2C):  78.6 ml 43.82 ml/m RA Volume:  34.70 ml 19.35 ml/m  LA Vol (A4C):  38.2 ml 21.30 ml/m  LA Biplane Vol: 60.2 ml 33.56 ml/m  AORTIC VALVE  AV Area (Vmax):  0.55 cm  AV Area (Vmean):  0.46 cm  AV Area (VTI):   0.51 cm  AV Vmax:      407.00 cm/s  AV Vmean:     300.000 cm/s  AV VTI:      1.190 m  AV Peak Grad:   66.3 mmHg  AV Mean Grad:   40.0 mmHg  LVOT Vmax:     70.90 cm/s  LVOT Vmean:    43.700 cm/s  LVOT VTI:     0.192 m  LVOT/AV VTI ratio: 0.16  AI PHT:      488 msec    AORTA  Ao Root diam: 2.90 cm   MITRAL VALVE  MV Area (PHT):        SHUNTS  MV Decel Time:        Systemic VTI: 0.19 m  MR Peak grad:  115.3 mmHg Systemic Diam: 2.00 cm  MR Mean grad:  75.0 mmHg  MR Vmax:     537.00 cm/s  MR Vmean:    401.0 cm/s  MR PISA:     1.01 cm  MR PISA Eff ROA: 6 mm  MR PISA Radius: 0.40 cm  MV E velocity: 66.90 cm/s  MV A velocity: 77.10 cm/s  MV E/A ratio: 0.87   EKG:  EKG is ordered today.  The ekg ordered today demonstrates NSR 63 bpm, no significant ST-T changes  Recent Labs: 03/31/2019: Magnesium 1.9  10/13/2019: ALT 12; BUN 16; Creatinine, Ser 0.77; Hemoglobin 12.0; Platelets 218; Potassium 4.8; Sodium 140; TSH 0.025  Recent Lipid Panel    Component Value Date/Time   CHOL 94 (L) 10/13/2019 0858   TRIG 78 10/13/2019 0858   HDL 49 10/13/2019 0858   CHOLHDL 1.9 10/13/2019 0858   LDLCALC 29 10/13/2019 0858    Physical Exam:    VS:  BP (!) 160/90   Pulse (!) 59   Ht 5' 4.5" (1.638 m)   Wt 160 lb 12.8 oz (72.9 kg)   SpO2 97%   BMI 27.17 kg/m     Wt Readings from Last 3 Encounters:  10/18/19  160 lb 12.8 oz (72.9 kg)  10/13/19 162 lb (73.5 kg)  05/20/19 161 lb (73 kg)     GEN:  Well nourished, well developed in no acute distress HEENT: Normal NECK: No JVD; No carotid bruits LYMPHATICS: No lymphadenopathy CARDIAC: RRR, 3/6 harsh late peaking systolic murmur at the right upper sternal border, A2 present but diminished, no diastolic murmur RESPIRATORY:  Clear to auscultation without rales, wheezing or rhonchi  ABDOMEN: Soft, non-tender, non-distended MUSCULOSKELETAL:  No edema; No deformity  SKIN: Warm and dry NEUROLOGIC:  Alert and oriented x 3 PSYCHIATRIC:  Normal affect   STS Risk Calculator: Isolated AVR -  Risk of Mortality: 1.309% Renal Failure: 1.153% Permanent Stroke: 1.276% Prolonged Ventilation: 4.288% DSW Infection: 0.057% Reoperation: 3.360% Morbidity or Mortality: 7.816% Short Length of Stay: 42.430% Long Length of Stay: 3.522%  ASSESSMENT:    1. Nonrheumatic aortic valve stenosis   2. Dilated aortic root (HCC)    PLAN:    In order of problems listed above:  1. The patient has severe, stage D1 aortic stenosis with New York Heart Association functional class II symptoms of fatigue and dyspnea that occurs with emotional stress.  I have personally reviewed her most recent echo images and compared her study to serial studies over the past few years.  As noted in the HPI, she has progressively severe aortic stenosis, now with peak and mean gradients of 66 and 40 mmHg, respectively.  The peak transaortic velocity now exceeds 4 m/s and a dimensionless index is 0.16.  She has mild aortic valve insufficiency and mild mitral insufficiency.  She may have a functionally bicuspid valve.  LV function is normal with LVEF approximately 60% and at least mild concentric LVH.  I have reviewed the natural history of aortic stenosis with the patient today. We have discussed the limitations of medical therapy and the poor prognosis associated with symptomatic aortic  stenosis. We have reviewed potential treatment options, including palliative medical therapy, conventional surgical aortic valve replacement, and transcatheter aortic valve replacement. We discussed treatment options in the context of the patient's specific comorbid medical conditions.  She understands that she will require further studies to determine her best treatment option.  She has been noted to have a dilated ascending aorta of approximately 4.4 cm in maximal diameter.  She will undergo a gated cardiac CTA as well as a CTA of the chest, abdomen, and pelvis.  This study will most accurately evaluate the size of her ascending aorta.  This will also demonstrate the cardiac anatomy and determine whether she might be a candidate for TAVR.  I have also recommended that she undergo right and left heart catheterization with coronary angiography to evaluate for the presence of obstructive coronary artery disease.  The procedure will be scheduled with her primary cardiologist, Dr. [name]. I have reviewed  the risks, indications, and alternatives to cardiac catheterization, possible angioplasty, and stenting with the patient. Risks include but are not limited to bleeding, infection, vascular injury, stroke, myocardial infection, arrhythmia, kidney injury, radiation-related injury in the case of prolonged fluoroscopy use, emergency cardiac surgery, and death. The patient understands the risks of serious complication is 1-2 in 4492 with diagnostic cardiac cath and 1-2% or less with angioplasty/stenting.  After completion of her preoperative studies, the patient will be referred for formal cardiac surgical consultation as part of a multidisciplinary heart team evaluation.  In the interim the patient is advised to avoid any strenuous activity.   Medication Adjustments/Labs and Tests Ordered: Current medicines are reviewed at length with the patient today.  Concerns regarding medicines are outlined above.  Orders Placed  This Encounter  Procedures  . CT CORONARY MORPH W/CTA COR W/SCORE W/CA W/CM &/OR WO/CM  . CT ANGIO ABDOMEN PELVIS  W &/OR WO CONTRAST  . CT ANGIO CHEST AORTA W/CM & OR WO/CM  . VAS US CAROTID   No orders of the defined types were placed in this encounter.   Patient Instructions  CATHETERIZATION INSTRUCTIONS: You are scheduled for a Cardiac Catheterization on: 10/28/2019 with Dr. [name].  1. Please arrive at the [facility] (Main Entrance A) at [facility]: [address] [place], [place] [address] at: 7:00AM (This time is two hours before your procedure to ensure your preparation). Free valet parking service is available. You are allowed ONE visitor in the waiting room during your procedure. Both you and your guest must wear masks. Special note: Every effort is made to have your procedure done on time. Please understand that emergencies sometimes delay scheduled procedures.  2. Diet: Do not eat solid foods after midnight.  You may have clear liquids until 5am upon the day of the procedure.  3. Medication instructions in preparation for your procedure:  1) TAKE ASPIRIN 81 mg the morning of your catheterization  2) You may take your other medications as instructed with sips of water  4. Plan for one night stay--bring personal belongings. 5. Bring a current list of your medications and current insurance cards. 6. You MUST have a responsible person to drive you home. 7. Someone MUST be with you the first 24 hours after you arrive home or your discharge will be delayed. 8. Please wear clothes that are easy to get on and off and wear slip-on shoes.  Thank you for allowing [place] to care for you!   -- [facility] Cardiovascular services      Signed, Sherren Mocha, MD  10/18/2019 5:13 PM    [place]

## 2019-10-26 ENCOUNTER — Other Ambulatory Visit (HOSPITAL_COMMUNITY)
Admission: RE | Admit: 2019-10-26 | Discharge: 2019-10-26 | Disposition: A | Discharge status: Home / Self Care | Payer: Medicare Other | Source: Ambulatory Visit | Attending: Cardiovascular Disease | Admitting: Cardiovascular Disease

## 2019-10-26 DIAGNOSIS — Z01812 Encounter for preprocedural laboratory examination: Secondary | ICD-10-CM | POA: Diagnosis present

## 2019-10-26 DIAGNOSIS — U071 COVID-19: Secondary | ICD-10-CM | POA: Insufficient documentation

## 2019-10-26 LAB — SARS CORONAVIRUS 2 (TAT 6-24 HRS): SARS Coronavirus 2: POSITIVE — AB

## 2019-10-27 ENCOUNTER — Telehealth: Payer: Self-pay

## 2019-10-27 NOTE — Progress Notes (Signed)
Notified [name] that patient's pre procedure covid test is +.  Cath will be canceled.  Joann Barry will notify patient

## 2019-10-27 NOTE — Telephone Encounter (Signed)
Pt underwent routine screening for Covid on 8/11 in preparation for 8/13 cardiac cath.  The pt's test came back positive for Covid.  I contacted the pt to make her aware of this information and she denies direct contact with any one who has Covid. The pt denies symptoms of Covid at this time.  The pt does have SOB at baseline and this is related to her underlying health issues. She denies any worsening in SOB.  I instructed the pt that she needs to quarantine at this time and monitor for symptoms of Covid. At this time I will reschedule the pt's cardiac cath to 8/27.  The pt agreed with plan.  If the pt develops any symptoms she will contact the Covid Hotline at [phone].  The pt's spouse will also monitor for symptoms due to the pt testing positive for Covid. The pt is fully vaccinated.

## 2019-11-09 ENCOUNTER — Telehealth: Payer: Self-pay | Admitting: *Deleted

## 2019-11-09 NOTE — Telephone Encounter (Signed)
Pt contacted pre-catheterization scheduled at [facility] for: Friday November 11, 2019 9 AM Verified arrival time and place: [place] [facility]) at: 7 AM   No solid food after midnight prior to cath, clear liquids until 5 AM day of procedure.   AM meds can be  taken pre-cath with sips of water including: ASA 81 mg   Confirmed patient has responsible adult to drive home post procedure and observe 24 hours after arriving home: yes  You are allowed ONE visitor in the waiting room during the time you are at the hospital for your procedure. Both you and your visitor must wear a mask once you enter the hospital.       COVID-19 Pre-Screening Questions:   In the past 10 days have you had a new cough, shortness of breath, headache, congestion, fever (100 or greater) unexplained body aches, new sore throat, or sudden loss of taste or sense of smell? no  In the past 10 days have you been around anyone with known Covid 19? no  Have you been vaccinated for COVID-19? Yes, see immunization history  Reviewed procedure/mask/visitor instructions, COVID-19 questions with patient.  Pt tested positive for COVID 10/26/19, has been asymptomatic.

## 2019-11-10 NOTE — Progress Notes (Signed)
Called patient to be sure no symptoms of Covid, no loss of taste or smell, no fevers.

## 2019-11-11 ENCOUNTER — Ambulatory Visit (HOSPITAL_COMMUNITY)
Admission: RE | Admit: 2019-11-11 | Discharge: 2019-11-11 | Disposition: A | Discharge status: Home / Self Care | Payer: Medicare Other | Attending: Cardiovascular Disease | Admitting: Cardiovascular Disease

## 2019-11-11 ENCOUNTER — Encounter (HOSPITAL_COMMUNITY)
Admission: RE | Disposition: A | Discharge status: Home / Self Care | Payer: Medicare Other | Source: Home / Self Care | Attending: Cardiovascular Disease

## 2019-11-11 ENCOUNTER — Other Ambulatory Visit: Payer: Self-pay

## 2019-11-11 DIAGNOSIS — I700 Atherosclerosis of aorta: Secondary | ICD-10-CM | POA: Diagnosis not present

## 2019-11-11 DIAGNOSIS — I712 Thoracic aortic aneurysm, without rupture: Secondary | ICD-10-CM | POA: Diagnosis not present

## 2019-11-11 DIAGNOSIS — Z79899 Other long term (current) drug therapy: Secondary | ICD-10-CM | POA: Insufficient documentation

## 2019-11-11 DIAGNOSIS — I2510 Atherosclerotic heart disease of native coronary artery without angina pectoris: Secondary | ICD-10-CM | POA: Insufficient documentation

## 2019-11-11 DIAGNOSIS — E7800 Pure hypercholesterolemia, unspecified: Secondary | ICD-10-CM | POA: Insufficient documentation

## 2019-11-11 DIAGNOSIS — Z79890 Hormone replacement therapy (postmenopausal): Secondary | ICD-10-CM | POA: Diagnosis not present

## 2019-11-11 DIAGNOSIS — I350 Nonrheumatic aortic (valve) stenosis: Secondary | ICD-10-CM | POA: Diagnosis present

## 2019-11-11 DIAGNOSIS — I10 Essential (primary) hypertension: Secondary | ICD-10-CM | POA: Diagnosis not present

## 2019-11-11 DIAGNOSIS — F419 Anxiety disorder, unspecified: Secondary | ICD-10-CM | POA: Diagnosis not present

## 2019-11-11 LAB — POCT I-STAT 7, (LYTES, BLD GAS, ICA,H+H)
Acid-Base Excess: 1 mmol/L (ref 0.0–2.0)
Bicarbonate: 26.9 mmol/L (ref 20.0–28.0)
Calcium, Ion: 1.24 mmol/L (ref 1.15–1.40)
HCT: 29 % — ABNORMAL LOW (ref 36.0–46.0)
Hemoglobin: 9.9 g/dL — ABNORMAL LOW (ref 12.0–15.0)
O2 Saturation: 99 %
Potassium: 4 mmol/L (ref 3.5–5.1)
Sodium: 143 mmol/L (ref 135–145)
TCO2: 28 mmol/L (ref 22–32)
pCO2 arterial: 48 mmHg (ref 32.0–48.0)
pH, Arterial: 7.356 (ref 7.350–7.450)
pO2, Arterial: 142 mmHg — ABNORMAL HIGH (ref 83.0–108.0)

## 2019-11-11 LAB — BASIC METABOLIC PANEL
Anion gap: 6 (ref 5–15)
BUN: 14 mg/dL (ref 8–23)
CO2: 28 mmol/L (ref 22–32)
Calcium: 8.5 mg/dL — ABNORMAL LOW (ref 8.9–10.3)
Chloride: 106 mmol/L (ref 98–111)
Creatinine, Ser: 0.73 mg/dL (ref 0.44–1.00)
GFR calc Af Amer: >60 mL/min (ref >60)
GFR calc non Af Amer: >60 mL/min (ref >60)
Glucose, Bld: 231 mg/dL — ABNORMAL HIGH (ref 70–99)
Potassium: 4.1 mmol/L (ref 3.5–5.1)
Sodium: 140 mmol/L (ref 135–145)

## 2019-11-11 LAB — POCT I-STAT EG7
Acid-Base Excess: 1 mmol/L (ref 0.0–2.0)
Acid-Base Excess: 1 mmol/L (ref 0.0–2.0)
Bicarbonate: 27.4 mmol/L (ref 20.0–28.0)
Bicarbonate: 27.6 mmol/L (ref 20.0–28.0)
Calcium, Ion: 1.24 mmol/L (ref 1.15–1.40)
Calcium, Ion: 1.26 mmol/L (ref 1.15–1.40)
HCT: 29 % — ABNORMAL LOW (ref 36.0–46.0)
HCT: 29 % — ABNORMAL LOW (ref 36.0–46.0)
Hemoglobin: 9.9 g/dL — ABNORMAL LOW (ref 12.0–15.0)
Hemoglobin: 9.9 g/dL — ABNORMAL LOW (ref 12.0–15.0)
O2 Saturation: 71 %
O2 Saturation: 71 %
Potassium: 3.9 mmol/L (ref 3.5–5.1)
Potassium: 3.9 mmol/L (ref 3.5–5.1)
Sodium: 143 mmol/L (ref 135–145)
Sodium: 143 mmol/L (ref 135–145)
TCO2: 29 mmol/L (ref 22–32)
TCO2: 29 mmol/L (ref 22–32)
pCO2, Ven: 51.1 mmHg (ref 44.0–60.0)
pCO2, Ven: 51.8 mmHg (ref 44.0–60.0)
pH, Ven: 7.331 (ref 7.250–7.430)
pH, Ven: 7.34 (ref 7.250–7.430)
pO2, Ven: 40 mmHg (ref 32.0–45.0)
pO2, Ven: 41 mmHg (ref 32.0–45.0)

## 2019-11-11 SURGERY — RIGHT HEART CATH AND CORONARY ANGIOGRAPHY
Anesthesia: LOCAL

## 2019-11-11 MED ORDER — LABETALOL HCL 5 MG/ML IV SOLN: 10.0000 mg | INTRAVENOUS | Status: DC | PRN

## 2019-11-11 MED ORDER — SODIUM CHLORIDE 0.9 % IV SOLN: 250.0000 mL | INTRAVENOUS | Status: DC | PRN

## 2019-11-11 MED ORDER — LIDOCAINE HCL (PF) 1 % IJ SOLN
INTRAMUSCULAR | Status: DC | PRN
  Administered 2019-11-11 (×2): 2 mL via INTRADERMAL

## 2019-11-11 MED ORDER — HEPARIN (PORCINE) IN NACL 1000-0.9 UT/500ML-% IV SOLN
INTRAVENOUS | Status: DC | PRN
  Administered 2019-11-11 (×2): 500 mL

## 2019-11-11 MED ORDER — SODIUM CHLORIDE 0.9% FLUSH: 3.0000 mL | Freq: Two times a day (BID) | INTRAVENOUS | Status: DC

## 2019-11-11 MED ORDER — SODIUM CHLORIDE 0.9% FLUSH: 3.0000 mL | INTRAVENOUS | Status: DC | PRN

## 2019-11-11 MED ORDER — ONDANSETRON HCL 4 MG/2ML IJ SOLN: 4.0000 mg | Freq: Four times a day (QID) | INTRAMUSCULAR | Status: DC | PRN

## 2019-11-11 MED ORDER — HYDRALAZINE HCL 20 MG/ML IJ SOLN: 10.0000 mg | INTRAMUSCULAR | Status: DC | PRN

## 2019-11-11 MED ORDER — HEPARIN SODIUM (PORCINE) 1000 UNIT/ML IJ SOLN
INTRAMUSCULAR | Status: DC | PRN
  Administered 2019-11-11: 3500 [IU] via INTRAVENOUS

## 2019-11-11 MED ORDER — SODIUM CHLORIDE 0.9 % WEIGHT BASED INFUSION: 1.0000 mL/kg/h | INTRAVENOUS | Status: DC

## 2019-11-11 MED ORDER — ASPIRIN 81 MG PO CHEW: 81.0000 mg | CHEWABLE_TABLET | ORAL | Status: DC

## 2019-11-11 MED ORDER — MIDAZOLAM HCL 2 MG/2ML IJ SOLN
INTRAMUSCULAR | Status: DC | PRN
  Administered 2019-11-11: 2 mg via INTRAVENOUS
  Administered 2019-11-11: 1 mg via INTRAVENOUS

## 2019-11-11 MED ORDER — IOHEXOL 350 MG/ML SOLN
INTRAVENOUS | Status: DC | PRN
  Administered 2019-11-11: 55 mL via INTRA_ARTERIAL

## 2019-11-11 MED ORDER — SODIUM CHLORIDE 0.9 % WEIGHT BASED INFUSION
3.0000 mL/kg/h | INTRAVENOUS | Status: AC
  Administered 2019-11-11: 3 mL/kg/h via INTRAVENOUS

## 2019-11-11 MED ORDER — VERAPAMIL HCL 2.5 MG/ML IV SOLN
INTRAVENOUS | Status: DC | PRN
  Administered 2019-11-11: 10 mL via INTRA_ARTERIAL

## 2019-11-11 MED ORDER — SODIUM CHLORIDE 0.9 % IV SOLN: INTRAVENOUS | Status: DC

## 2019-11-11 MED ORDER — ACETAMINOPHEN 325 MG PO TABS: 650.0000 mg | ORAL_TABLET | ORAL | Status: DC | PRN

## 2019-11-11 MED ORDER — FENTANYL CITRATE (PF) 100 MCG/2ML IJ SOLN
INTRAMUSCULAR | Status: DC | PRN
  Administered 2019-11-11: 50 ug via INTRAVENOUS
  Administered 2019-11-11: 25 ug via INTRAVENOUS

## 2019-11-11 SURGICAL SUPPLY — 14 items
CATH BALLN WEDGE 5F 110CM (CATHETERS) ×2 IMPLANT
CATH INFINITI 5FR JL4 (CATHETERS) ×2 IMPLANT
CATH INFINITI JR4 5F (CATHETERS) ×2 IMPLANT
CATH OPTITORQUE TIG 4.0 5F (CATHETERS) ×2 IMPLANT
DEVICE RAD COMP TR BAND LRG (VASCULAR PRODUCTS) ×2 IMPLANT
GLIDESHEATH SLEND SS 6F .021 (SHEATH) ×2 IMPLANT
GUIDEWIRE INQWIRE 1.5J.035X260 (WIRE) ×1 IMPLANT
INQWIRE 1.5J .035X260CM (WIRE) ×2
KIT HEART LEFT (KITS) ×2 IMPLANT
PACK CARDIAC CATHETERIZATION (CUSTOM PROCEDURE TRAY) ×2 IMPLANT
SHEATH GLIDE SLENDER 4/5FR (SHEATH) ×2 IMPLANT
SHEATH PROBE COVER 6X72 (BAG) ×2 IMPLANT
TRANSDUCER W/STOPCOCK (MISCELLANEOUS) ×2 IMPLANT
TUBING CIL FLEX 10 FLL-RA (TUBING) ×2 IMPLANT

## 2019-11-11 NOTE — Discharge Instructions (Signed)
Drink plenty of fluid for 48 hours and keep wrist elevated at heart level for 24 hours  Radial Site Care   This sheet gives you information about how to care for yourself after your procedure. Your health care provider may also give you more specific instructions. If you have problems or questions, contact your health care provider. What can I expect after the procedure? After the procedure, it is common to have:  Bruising and tenderness at the catheter insertion area. Follow these instructions at home: Medicines  Take over-the-counter and prescription medicines only as told by your health care provider. Insertion site care 1. Follow instructions from your health care provider about how to take care of your insertion site. Make sure you: ? Wash your hands with soap and water before you change your bandage (dressing). If soap and water are not available, use hand sanitizer. ? remove your dressing as told by your health care provider. In 24 hours 2. Check your insertion site every day for signs of infection. Check for: ? Redness, swelling, or pain. ? Fluid or blood. ? Pus or a bad smell. ? Warmth. 3. Do not take baths, swim, or use a hot tub until your health care provider approves. 4. You may shower 24-48 hours after the procedure, or as directed by your health care provider. ? Remove the dressing and gently wash the site with plain soap and water. ? Pat the area dry with a clean towel. ? Do not rub the site. That could cause bleeding. 5. Do not apply powder or lotion to the site. Activity   1. For 24 hours after the procedure, or as directed by your health care provider: ? Do not flex or bend the affected arm. ? Do not push or pull heavy objects with the affected arm. ? Do not drive yourself home from the hospital or clinic. You may drive 24 hours after the procedure unless your health care provider tells you not to. ? Do not operate machinery or power tools. 2. Do not lift  anything that is heavier than 10 lb (4.5 kg), or the limit that you are told, until your health care provider says that it is safe. For 4 days 3. Ask your health care provider when it is okay to: ? Return to work or school. ? Resume usual physical activities or sports. ? Resume sexual activity. General instructions  If the catheter site starts to bleed, raise your arm and put firm pressure on the site. If the bleeding does not stop, get help right away. This is a medical emergency.  If you went home on the same day as your procedure, a responsible adult should be with you for the first 24 hours after you arrive home.  Keep all follow-up visits as told by your health care provider. This is important. Contact a health care provider if:  You have a fever.  You have redness, swelling, or yellow drainage around your insertion site. Get help right away if:  You have unusual pain at the radial site.  The catheter insertion area swells very fast.  The insertion area is bleeding, and the bleeding does not stop when you hold steady pressure on the area.  Your arm or hand becomes pale, cool, tingly, or numb. These symptoms may represent a serious problem that is an emergency. Do not wait to see if the symptoms will go away. Get medical help right away. Call your local emergency services (911 in the [place] Do not   drive yourself to the hospital. Summary  After the procedure, it is common to have bruising and tenderness at the site.  Follow instructions from your health care provider about how to take care of your radial site wound. Check the wound every day for signs of infection.  Do not lift anything that is heavier than 10 lb (4.5 kg), or the limit that you are told, until your health care provider says that it is safe. This information is not intended to replace advice given to you by your health care provider. Make sure you discuss any questions you have with your health care  provider. Document Revised: 04/08/2017 Document Reviewed: 04/08/2017 Elsevier Patient Education  2020 Elsevier Inc.  

## 2019-11-11 NOTE — Progress Notes (Signed)
Patient and husband was given discharge instructions. Both verbalized understanding. 

## 2019-11-11 NOTE — Interval H&P Note (Signed)
History and Physical Interval Note:  11/11/2019 10:01 AM  Joann Barry  has presented today for surgery, with the diagnosis of aortic stenosis.  The various methods of treatment have been discussed with the patient and family. After consideration of risks, benefits and other options for treatment, the patient has consented to  Procedure(s): RIGHT/LEFT HEART CATH AND CORONARY ANGIOGRAPHY (N/A) as a surgical intervention.  The patient's history has been reviewed, patient examined, no change in status, stable for surgery.  I have reviewed the patient's chart and labs.  Questions were answered to the patient's satisfaction.     [name]

## 2019-11-14 ENCOUNTER — Encounter (HOSPITAL_COMMUNITY): Payer: Self-pay | Admitting: Cardiovascular Disease

## 2019-11-15 ENCOUNTER — Ambulatory Visit (HOSPITAL_COMMUNITY)
Admission: RE | Admit: 2019-11-15 | Discharge: 2019-11-15 | Disposition: A | Discharge status: Home / Self Care | Payer: Medicare Other | Source: Home / Self Care | Attending: Cardiovascular Disease | Admitting: Cardiovascular Disease

## 2019-11-15 ENCOUNTER — Ambulatory Visit (HOSPITAL_COMMUNITY)
Admission: RE | Admit: 2019-11-15 | Discharge: 2019-11-15 | Disposition: A | Discharge status: Home / Self Care | Payer: Medicare Other | Source: Ambulatory Visit | Attending: Cardiovascular Disease | Admitting: Cardiovascular Disease

## 2019-11-15 ENCOUNTER — Other Ambulatory Visit: Payer: Self-pay

## 2019-11-15 DIAGNOSIS — I350 Nonrheumatic aortic (valve) stenosis: Secondary | ICD-10-CM | POA: Insufficient documentation

## 2019-11-15 DIAGNOSIS — I77810 Thoracic aortic ectasia: Secondary | ICD-10-CM | POA: Diagnosis present

## 2019-11-15 MED ORDER — IOHEXOL 350 MG/ML SOLN
100.0000 mL | Freq: Once | INTRAVENOUS | Status: AC | PRN
  Administered 2019-11-15: 100 mL via INTRAVENOUS

## 2019-11-15 NOTE — Progress Notes (Signed)
Carotid duplex bilateral study completed.   Please see CV Proc for preliminary results.   Joann Barry

## 2019-11-16 ENCOUNTER — Institutional Professional Consult (permissible substitution): Payer: Medicare Other | Admitting: Surgery

## 2019-11-16 ENCOUNTER — Other Ambulatory Visit: Payer: Self-pay

## 2019-11-16 ENCOUNTER — Encounter: Payer: Self-pay | Admitting: Surgery

## 2019-11-16 VITALS — BP 150/72 | HR 65 | Temp 97.6°F | Resp 20 | Ht 64.5 in | Wt 160.0 lb

## 2019-11-16 DIAGNOSIS — I350 Nonrheumatic aortic (valve) stenosis: Secondary | ICD-10-CM | POA: Diagnosis not present

## 2019-11-16 NOTE — Progress Notes (Signed)
HEART AND [place] SURGERY CONSULTATION REPORT  Referring Provider is Joann Sine, MD Primary Cardiologist is Joann Majestic, MD PCP is Joann Bunting, MD  Chief Complaint  Patient presents with  . Aortic Stenosis    Surgical consult for TAVR, review all testing    HPI:  The patient is a 74 year old active woman with a history of hypertension, hyperlipidemia, and aortic stenosis who was referred for consideration of TAVR versus open AVR.  She is here today with her husband.  She said that she was diagnosed with a heart murmur in 2018 by Dr. Reynaldo Barry and was referred to Dr. Claiborne Barry for evaluation.  An echocardiogram in 2018 showed a mean gradient of 28 mmHg and a peak gradient of 45 mmHg with a dimensionless index of 0.24.  She was asymptomatic at that time.  A follow-up echo in 2019 showed an increase in the mean gradient to 34 mmHg with a dimensionless index of 0.19.  She said that she continued to feel well.  Continued follow-up was recommended.  She had a follow-up echocardiogram on 08/24/2019 which showed a further increase in the mean gradient to 40 mmHg consistent with severe aortic stenosis with a dimensionless index of 0.16 and a calculated aortic valve area of 0.5 cm.  Left ventricular ejection fraction was 55 to 60%.  The ascending aorta was measured at 44 mm and was unchanged from her echo in 2018 was measured at 45 mm.  She subsequently underwent cardiac catheterization on 11/11/2019 which showed mildly elevated PA pressures of 38/23.  There was no coronary disease.   She denies any fatigue or chest discomfort.  She has had some exertional shortness of breath with carrying groceries or feeling stressed.  She denies any dizziness or syncope.  She has had no orthopnea or PND.  She denies any peripheral edema.  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  She does report being  tired at the end of the day.    Past Medical History:  Diagnosis Date  . Anxiety   . Aortic stenosis    documented on cardiac clearance note  . Arthritis   . Elevated lipoprotein A level 04/19/2019  . Heart murmur   . Hypercholesterolemia   . Hypertension   . Personal history of colonic polyps   . S/P thyroidectomy 03/30/2019  . Thyroid nodule    Bilateral    Past Surgical History:  Procedure Laterality Date  . COLONOSCOPY  01/18/2014  . PARTIAL HYSTERECTOMY  1980  . POLYPECTOMY    . RIGHT HEART CATH AND CORONARY ANGIOGRAPHY N/A 11/11/2019   Procedure: RIGHT HEART CATH AND CORONARY ANGIOGRAPHY;  Surgeon: Joann Sine, MD;  Location: [facility];  Service: Cardiovascular;  Laterality: N/A;  . THYROIDECTOMY  03/30/2019  . THYROIDECTOMY N/A 03/30/2019   Procedure: TOTAL THYROIDECTOMY;  Surgeon: Joann Ok, MD;  Location: [facility] OR;  Service: General;  Laterality: N/A;    Family History  Problem Relation Age of Onset  . Pancreatic cancer Mother   . Colon cancer Neg Hx   . Rectal cancer Neg Hx   . Stomach cancer Neg Hx   . Esophageal cancer Neg Hx   . Colon polyps Neg Hx     Social History   Socioeconomic History  . Marital status: Married    Spouse name: Not on file  . Number of children: Not on file  .  Years of education: Not on file  . Highest education level: Not on file  Occupational History  . Not on file  Tobacco Use  . Smoking status: Never Smoker  . Smokeless tobacco: Never Used  Vaping Use  . Vaping Use: Never used  Substance and Sexual Activity  . Alcohol use: No  . Drug use: No  . Sexual activity: Not on file  Other Topics Concern  . Not on file  Social History Narrative  . Not on file   Social Determinants of Health   Financial Resource Strain:   . Difficulty of Paying Living Expenses: Not on file  Food Insecurity:   . Worried About Charity fundraiser in the Last Year: Not on file  . Ran Out of Food in the Last Year: Not on file    Transportation Needs:   . Lack of Transportation (Medical): Not on file  . Lack of Transportation (Non-Medical): Not on file  Physical Activity:   . Days of Exercise per Week: Not on file  . Minutes of Exercise per Session: Not on file  Stress:   . Feeling of Stress : Not on file  Social Connections:   . Frequency of Communication with Friends and Family: Not on file  . Frequency of Social Gatherings with Friends and Family: Not on file  . Attends Religious Services: Not on file  . Active Member of Clubs or Organizations: Not on file  . Attends Archivist Meetings: Not on file  . Marital Status: Not on file  Intimate Partner Violence:   . Fear of Current or Ex-Partner: Not on file  . Emotionally Abused: Not on file  . Physically Abused: Not on file  . Sexually Abused: Not on file    Current Outpatient Medications  Medication Sig Dispense Refill  . atenolol (TENORMIN) 50 MG tablet Take 50 mg by mouth daily.    . cholecalciferol (VITAMIN D3) 25 MCG (1000 UNIT) tablet Take 1,000 Units by mouth in the morning and at bedtime.     . Evolocumab (REPATHA SURECLICK) 595 MG/ML SOAJ Inject 140 mg into the skin every 14 (fourteen) days. 2 pen 11  . fluticasone (FLONASE) 50 MCG/ACT nasal spray Place 2 sprays into both nostrils daily as needed for allergies.     Marland Kitchen levothyroxine (SYNTHROID) 112 MCG tablet Take 1 tablet (112 mcg total) by mouth daily. 30 tablet 11  . losartan (COZAAR) 50 MG tablet Take 50mg  (1 tab)  in the AM and 25mg  (1/2 tab) in the PM (Patient taking differently: Take 25-50 mg by mouth See admin instructions. Take 50mg  in the AM and 25mg   in the PM) 135 tablet 3  . meclizine (ANTIVERT) 25 MG tablet Take 25 mg by mouth every 8 (eight) hours as needed for dizziness.     . Multiple Vitamins-Minerals (CENTRUM SILVER 50+WOMEN PO) Take 1 tablet by mouth daily.     . rosuvastatin (CRESTOR) 20 MG tablet Take 1 tablet (20 mg total) by mouth every evening. 90 tablet 3  .  zolpidem (AMBIEN) 10 MG tablet Take 5 mg by mouth at bedtime as needed for sleep.      No current facility-administered medications for this visit.    Allergies  Allergen Reactions  . Sulfonamide Derivatives Swelling      Review of Systems:   General:  normal appetite, + decreased energy, no weight gain, no weight loss, no fever  Cardiac:  no chest pain with exertion, no chest pain at  rest, +SOB with moderate exertion, no resting SOB, no PND, no orthopnea, no palpitations, no arrhythmia, no atrial fibrillation, no LE edema, no dizzy spells, no syncope  Respiratory:  + exertional shortness of breath, no home oxygen, no productive cough, no dry cough, no bronchitis, no wheezing, no hemoptysis, no asthma, no pain with inspiration or cough, no sleep apnea, no CPAP at night  GI:   no difficulty swallowing, no reflux, no frequent heartburn, no hiatal hernia, no abdominal pain, no constipation, no diarrhea, no hematochezia, no hematemesis, no melena  GU:   no dysuria,  no frequency, no urinary tract infection, no hematuria, no kidney stones, no kidney disease  Vascular:  no pain suggestive of claudication, no pain in feet, no leg cramps, no varicose veins, no DVT, no non-healing foot ulcer  Neuro:   no stroke, no TIA's, no seizures, no headaches, no temporary blindness one eye,  no slurred speech, no peripheral neuropathy, no chronic pain, no instability of gait, no memory/cognitive dysfunction  Musculoskeletal: no arthritis, no joint swelling, no myalgias, no difficulty walking, normal mobility   Skin:   no rash, no itching, no skin infections, no pressure sores or ulcerations  Psych:   no anxiety, no depression, no nervousness, no unusual recent stress  Eyes:   no blurry vision, no floaters, no recent vision changes, does not wear glasses or contacts  ENT:   no hearing loss, no loose or painful teeth, no dentures, last saw dentist every 6 months.  Hematologic:  no easy bruising, no abnormal  bleeding, no clotting disorder, no frequent epistaxis  Endocrine:  no diabetes, does not check CBG's at home    Physical Exam:   BP (!) 150/72   Pulse 65   Temp 97.6 F (36.4 C) (Skin)   Resp 20   Ht 5' 4.5" (1.638 m)   Wt 160 lb (72.6 kg)   SpO2 95% Comment: RA  BMI 27.04 kg/m   General:  Looks younger than her age,  well-appearing  HEENT:  Unremarkable, NCAT, PERLA, EOMI  Neck:   no JVD, no bruits, no adenopathy   Chest:   clear to auscultation, symmetrical breath sounds, no wheezes, no rhonchi   CV:   RRR, grade lll/VI crescendo/decrescendo murmur heard best at RSB,  no diastolic murmur  Abdomen:  soft, non-tender, no masses   Extremities:  warm, well-perfused, pulses palpable at ankle, no LE edema  Rectal/GU  Deferred  Neuro:   Grossly non-focal and symmetrical throughout  Skin:   Clean and dry, no rashes, no breakdown   Diagnostic Tests:   ECHOCARDIOGRAM REPORT       Patient Name:  Joann Barry Date of Exam: 08/24/2019  Medical Rec #: [id number]   Height:    64.5 in  Accession #:  [id number]   Weight:    161.0 lb  Date of Birth: [birth year]   BSA:     1.794 m  Patient Age:  4 years    BP:      188/89 mmHg  Patient Gender: F       HR:      59 bpm.  Exam Location: [address]   Procedure: 2D Echo, Cardiac Doppler, Color Doppler and Strain Analysis   Indications:  I35.0 Aortic Stenosis    History:    Patient has prior history of Echocardiogram examinations,  most         recent 02/28/2019. Thoracic aortic aneurysm; Risk         Factors:Hypertension.  Sonographer:  Marygrace Drought RCS  Referring Phys: [phone] HAO MENG   IMPRESSIONS    1. Left ventricular ejection fraction, by estimation, is 55 to 60%. The  left ventricle has normal function. The left ventricle has no regional  wall motion abnormalities. There is mild left ventricular hypertrophy.  Left ventricular diastolic  parameters  are consistent with Grade I diastolic dysfunction (impaired relaxation).  The average left ventricular global longitudinal strain is -16.7 %.  2. Right ventricular systolic function is normal. The right ventricular  size is normal.  3. Left atrial size was mildly dilated.  4. The mitral valve is normal in structure. Mild mitral valve  regurgitation. No evidence of mitral stenosis.  5. The aortic valve is normal in structure. Aortic valve regurgitation is  moderate. Severe aortic valve stenosis. Aortic valve area, by VTI measures  0.51 cm. Aortic valve mean gradient measures 40.0 mmHg. Aortic valve Vmax  measures 4.07 m/s.  6. Aortic dilatation noted. There is moderate dilatation of the ascending  aorta measuring 44 mm.  7. The inferior vena cava is normal in size with greater than 50%  respiratory variability, suggesting right atrial pressure of 3 mmHg.   Comparison(s): Changes from prior study are noted. Aortic stenosis is  severe when compared to prior.   FINDINGS  Left Ventricle: Left ventricular ejection fraction, by estimation, is 55  to 60%. The left ventricle has normal function. The left ventricle has no  regional wall motion abnormalities. The average left ventricular global  longitudinal strain is -16.7 %.  The left ventricular internal cavity size was normal in size. There is  mild left ventricular hypertrophy. Left ventricular diastolic parameters  are consistent with Grade I diastolic dysfunction (impaired relaxation).   Right Ventricle: The right ventricular size is normal. No increase in  right ventricular wall thickness. Right ventricular systolic function is  normal.   Left Atrium: Left atrial size was mildly dilated.   Right Atrium: Right atrial size was normal in size.   Pericardium: There is no evidence of pericardial effusion.   Mitral Valve: The mitral valve is normal in structure. Normal mobility of  the mitral valve leaflets. Mild  mitral valve regurgitation. No evidence of  mitral valve stenosis.   Tricuspid Valve: The tricuspid valve is normal in structure. Tricuspid  valve regurgitation is not demonstrated. No evidence of tricuspid  stenosis.   Aortic Valve: The aortic valve is normal in structure.. There is severe  thickening and severe calcifcation of the aortic valve. Aortic valve  regurgitation is moderate. Aortic regurgitation PHT measures 488 msec.  Severe aortic stenosis is present. There  is severe thickening of the aortic valve. There is severe calcifcation of  the aortic valve. Aortic valve mean gradient measures 40.0 mmHg. Aortic  valve peak gradient measures 66.3 mmHg. Aortic valve area, by VTI measures  0.51 cm.   Pulmonic Valve: The pulmonic valve was normal in structure. Pulmonic valve  regurgitation is trivial. No evidence of pulmonic stenosis.   Aorta: Aortic dilatation noted. There is moderate dilatation of the  ascending aorta measuring 44 mm.   Venous: The inferior vena cava is normal in size with greater than 50%  respiratory variability, suggesting right atrial pressure of 3 mmHg.   IAS/Shunts: No atrial level shunt detected by color flow Doppler.     LEFT VENTRICLE  PLAX 2D  LVIDd:     4.83 cm Diastology  LVIDs:     3.53 cm LV e' lateral:  5.87 cm/s  LV PW:     1.51 cm LV E/e' lateral: 11.4  LV IVS:    1.31 cm LV e' medial:  4.46 cm/s  LVOT diam:   2.00 cm LV E/e' medial: 15.0  LV SV:     60  LV SV Index:  34    2D Longitudinal Strain  LVOT Area:   3.14 cm 2D Strain GLS Avg:   -16.7 %     RIGHT VENTRICLE  RV Basal diam: 2.61 cm  RV S prime:   9.03 cm/s  TAPSE (M-mode): 2.1 cm   LEFT ATRIUM       Index    RIGHT ATRIUM      Index  LA diam:    3.90 cm 2.17 cm/m RA Area:   15.20 cm  LA Vol (A2C):  78.6 ml 43.82 ml/m RA Volume:  34.70 ml 19.35 ml/m  LA Vol (A4C):  38.2 ml 21.30 ml/m  LA Biplane Vol:  60.2 ml 33.56 ml/m  AORTIC VALVE  AV Area (Vmax):  0.55 cm  AV Area (Vmean):  0.46 cm  AV Area (VTI):   0.51 cm  AV Vmax:      407.00 cm/s  AV Vmean:     300.000 cm/s  AV VTI:      1.190 m  AV Peak Grad:   66.3 mmHg  AV Mean Grad:   40.0 mmHg  LVOT Vmax:     70.90 cm/s  LVOT Vmean:    43.700 cm/s  LVOT VTI:     0.192 m  LVOT/AV VTI ratio: 0.16  AI PHT:      488 msec    AORTA  Ao Root diam: 2.90 cm   MITRAL VALVE  MV Area (PHT):        SHUNTS  MV Decel Time:        Systemic VTI: 0.19 m  MR Peak grad:  115.3 mmHg Systemic Diam: 2.00 cm  MR Mean grad:  75.0 mmHg  MR Vmax:     537.00 cm/s  MR Vmean:    401.0 cm/s  MR PISA:     1.01 cm  MR PISA Eff ROA: 6 mm  MR PISA Radius: 0.40 cm  MV E velocity: 66.90 cm/s  MV A velocity: 77.10 cm/s  MV E/A ratio: 0.87   Candee Furbish MD  Electronically signed by Candee Furbish MD  Signature Date/Time: 08/24/2019/11:00:42 AM        Physicians  Panel Physicians Referring Physician Case Authorizing Physician  Joann Sine, MD (Primary)    Procedures  RIGHT HEART CATH AND CORONARY ANGIOGRAPHY  Conclusion  Normal to mildly elevated right heart pressures with PA systolic pressure at 38 and mean pressure at 23.  Normal coronary arteries.  Previous echo Doppler determination of severe aortic stenosis with a peak gradient of 66, mean gradient of 40, DI of 0.16 and AVA of 0.5 cm.  RECOMMENDATION:  Patient will return to the structural heart team for further evaluation of TAVR candidacy.   To further evaluate the size of her ascending aorta, she will be scheduled for a gated cardiac CTA as well as a CTA of the chest, abdomen, and pelvis.  Recommendations  Antiplatelet/Anticoag Patient will undergo evaluation for possible TAVR versus surgical aortic valve replacement.  Indications  Severe aortic stenosis [I35.0 (ICD-10-CM)]  Procedural  Details  Technical Details Joann Barry is a 74 year old female who has developed progressive aortic valve stenosis.  Her most recent echo Doppler study has shown a  peak gradient of 66, mean gradient of 44, aortic valve area of 0.5 cm, dimensionless index of 0.16.  She is felt to have severe D1 aortic stenosis, functional class II.  I referred her to Dr. [name] for consideration of TAVR candidacy.  She also has a mildly dilated aortic root.  She is now referred for definitive right and left heart catheterization with coronary angiography.  The patient arrived to the catheterization laboratory in the fasting state.  She was premedicated with Versed 2 mg and fentanyl 25 mcg.  An IV was sent in the brachial vein.  This was exchanged under sterile technique a 5 French glide sheath slender sheath.  A 5 French right heart catheter was then inserted and pressures were recorded in the right atrium, right ventricle, pulmonary artery, and pulmonary capillary wedge positions.  Oxygen saturation was obtained in the PA.  Patient received an additional 1 mg of Versed and 25 mcg of fentanyl.  Ultrasound guidance was used for right radial access.  Right radial artery was punctured without difficulty and a 6 [place] Glidesheath slender sheath was inserted.  Verapamil 3 mg was administered.  Initially a take catheter was inserted but due to the dilated aortic root this was then removed.  A JR4 catheter was used for selective angiography into the right coronary artery.  A JL4 catheter was used for selective angiography into the left coronary system.  Fluoroscopy revealed a severely calcified aortic valve with reduced excursion.  Since the patient has been documented to have severe aortic stenosis, no attempt was made to cross the aortic valve.  The patient will undergo CTA imaging of her aorta as part of her TAVR work-up.  All catheters were removed from the patient.  Hemostasis was obtained for the brachial access.  A TR band  was inserted for right radial hemostasis.  The patient left the catheterization laboratory chest pain-free with stable hemodynamics. Estimated blood loss <50 mL.   During this procedure medications were administered to achieve and maintain moderate conscious sedation while the patient's heart rate, blood pressure, and oxygen saturation were continuously monitored and I was present face-to-face 100% of this time.  Medications (Filter: Administrations occurring from [phone] to 1052 on 11/11/19) Heparin (Porcine) in NaCl 1000-0.9 UT/500ML-% SOLN (mL) Total volume:  1,000 mL Date/Time  Rate/Dose/Volume Action  11/11/19 0937  500 mL Given  0938  500 mL Given    midazolam (VERSED) injection (mg) Total dose:  3 mg Date/Time  Rate/Dose/Volume Action  11/11/19 0940  2 mg Given  1011  1 mg Given    fentaNYL (SUBLIMAZE) injection (mcg) Total dose:  75 mcg Date/Time  Rate/Dose/Volume Action  11/11/19 0940  50 mcg Given  1011  25 mcg Given    lidocaine (PF) (XYLOCAINE) 1 % injection (mL) Total volume:  4 mL Date/Time  Rate/Dose/Volume Action  11/11/19 1011  2 mL Given  1020  2 mL Given    Radial Cocktail/Verapamil only (mL) Total volume:  10 mL Date/Time  Rate/Dose/Volume Action  11/11/19 1026  10 mL Given    heparin sodium (porcine) injection (Units) Total dose:  3,500 Units Date/Time  Rate/Dose/Volume Action  11/11/19 1029  3,500 Units Given    iohexol (OMNIPAQUE) 350 MG/ML injection (mL) Total volume:  55 mL Date/Time  Rate/Dose/Volume Action  11/11/19 1047  55 mL Given    Sedation Time  Sedation Time Physician-1: 1 hour 1 minute 44 seconds  Contrast  Medication Name Total Dose  iohexol (OMNIPAQUE) 350 MG/ML  injection 55 mL    Radiation/Fluoro  Fluoro time: 7.1 (min) DAP: 10789 (mGycm2) Cumulative Air Kerma: 179 (mGy)  Coronary Findings  Diagnostic Dominance: Right Ramus Intermedius  Vessel is small.  Intervention  No interventions have been documented. Right  Heart  Right Heart Pressures RA: A-wave 7, V wave 6, mean 4 RV: 38/6 PA: 38/11; mean 23 PW; A-wave 18;  V wave 17; mean 12  Central aortic pressure 111/47; mean 70  Oxygen saturation in the pulmonary artery 71% and in the central aorta 99%.  Fick method cardiac output 5.3 L/min with an index of 2.9 L/min/m.  PVR: 2.1 WU  Coronary Diagrams  Diagnostic Dominance: Right  Intervention  Implants   No implant documentation for this case.  Syngo Images  Show images for CARDIAC CATHETERIZATION Images on Long Term Storage  Show images for Pia, Jedlicka to Procedure Log  Procedure Log    Hemo Data   Most Recent Value  Fick Cardiac Output 5.25 L/min  Fick Cardiac Output Index 2.91 (L/min)/BSA  RA A Wave 7 mmHg  RA V Wave 6 mmHg  RA Mean 4 mmHg  RV Systolic Pressure 38 mmHg  RV Diastolic Pressure 1 mmHg  RV EDP 6 mmHg  PA Systolic Pressure 38 mmHg  PA Diastolic Pressure 11 mmHg  PA Mean 23 mmHg  PW A Wave 18 mmHg  PW V Wave 17 mmHg  PW Mean 12 mmHg  AO Systolic Pressure 759 mmHg  AO Diastolic Pressure 47 mmHg  AO Mean 70 mmHg  QP/QS 1  TPVR Index 7.9 HRUI  TSVR Index 24.05 HRUI  PVR SVR Ratio 0.17  TPVR/TSVR Ratio 0.33    ADDENDUM REPORT: 11/15/2019 16:52  ADDENDUM: Extracardiac findings were described separately under dictation for contemporaneously obtained CTA chest, abdomen and pelvis dated 11/15/2019. Please see that report for full description of relevant extracardiac findings.   Electronically Signed   By: Vinnie Langton M.D.   On: 11/15/2019 16:52   Addended by Etheleen Mayhew, MD on 11/15/2019 5:00 PM  Study Result  Addenda  ADDENDUM REPORT: 11/15/2019 16:52  ADDENDUM: Extracardiac findings were described separately under dictation for contemporaneously obtained CTA chest, abdomen and pelvis dated 11/15/2019. Please see that report for full description of relevant extracardiac findings.   Electronically Signed    By: Vinnie Langton M.D.   On: 11/15/2019 16:52   Signed by Etheleen Mayhew, MD on 11/15/2019 5:00 PM  Narrative & Impression  CLINICAL DATA:  Aortic Stenosis  EXAM: Cardiac TAVR CT  TECHNIQUE: The patient was scanned on a Siemens Force 163 slice scanner. A 120 kV retrospective scan was triggered in the ascending thoracic aorta at 140 HU's. Gantry rotation speed was 250 msecs and collimation was .6 mm. No beta blockade or nitro were given. The 3D data set was reconstructed in 5% intervals of the R-R cycle. Systolic and diastolic phases were analyzed on a dedicated work station using MPR, MIP and VRT modes. The patient received 80 cc of contrast.  FINDINGS: Aortic Valve: Functionally bicuspid with fused right and left cusps calcified with restricted leaflet motion AV calcium score of 2741 suggesting severe AS  Aorta: Moderately dilated aortic root with normal arch vessels and mild calcific atherosclerosis  Sino-tubular Junction: 31 mm  Ascending Thoracic Aorta: 43 mm  Aortic Arch: 34 mm  Descending Thoracic Aorta: 26 mm  Sinus of Valsalva Measurements:  Commissural: 29.6 mm  Long Axis: 38.7 mm  Coronary Artery Height above Annulus:  Left Main: 14.65 mm above annulus  Right Coronary: 11.61 mm above annulus  Virtual Basal Annulus Measurements:  Maximum / Minimum Diameter: 27.8 mm x 21.5 mm  Perimeter: 80.4 mm  Area: 500.58 mm 2  Coronary Arteries: Sufficient height above annulus for deployment  Optimum Fluoroscopic Angle for Delivery: LAO 8 Caudal 5 degrees  IMPRESSION: 1. Bicuspid AV with calcium score of 2741 suggesting severe AS  2.  Annular area of 500 mm2 suitable for a 26 mm Sapien 3 valve  3.  Moderately dilated aortic root 4.3 cm  4.  Optimal angiographic angle for deployment LAO 8 Caudal 5 degrees  5.  Coronary arteries sufficient height above annulus for deployment  Jenkins Rouge  Electronically Signed: By:  Jenkins Rouge M.D. On: 11/15/2019 12:00    CLINICAL DATA:  74 year old female with history of severe aortic stenosis. Preprocedural study prior to potential transcatheter aortic valve replacement (TAVR) procedure.  EXAM: CT ANGIOGRAPHY CHEST, ABDOMEN AND PELVIS  TECHNIQUE: Non-contrast CT of the chest was initially obtained.  Multidetector CT imaging through the chest, abdomen and pelvis was performed using the standard protocol during bolus administration of intravenous contrast. Multiplanar reconstructed images and MIPs were obtained and reviewed to evaluate the vascular anatomy.  CONTRAST:  129mL OMNIPAQUE IOHEXOL 350 MG/ML SOLN  COMPARISON:  No priors.  FINDINGS: CTA CHEST FINDINGS  Cardiovascular: Heart size is mildly enlarged. There is no significant pericardial fluid, thickening or pericardial calcification. There is aortic atherosclerosis, as well as atherosclerosis of the great vessels of the mediastinum and the coronary arteries, including calcified atherosclerotic plaque in the left anterior descending coronary artery. Mild aneurysmal dilatation of the ascending thoracic aorta (4.5 cm in diameter). Severe thickening and calcification of the aortic valve.  Mediastinum/Lymph Nodes: No pathologically enlarged mediastinal or hilar lymph nodes. Esophagus is unremarkable in appearance. No axillary lymphadenopathy.  Lungs/Pleura: No suspicious appearing pulmonary nodules or masses are noted. No acute consolidative airspace disease. No pleural effusions.  Musculoskeletal/Soft Tissues: There are no aggressive appearing lytic or blastic lesions noted in the visualized portions of the skeleton.  CTA ABDOMEN AND PELVIS FINDINGS  Hepatobiliary: Small hypervascular areas in segment 2 (axial image 90 of series 9) and segment 4A/8 (axial image 87 of series 9) measuring up to 1.3 x 0.8 cm. No intra or extrahepatic biliary ductal dilatation. Gallbladder is  normal in appearance.  Pancreas: No pancreatic mass. No pancreatic ductal dilatation. No pancreatic or peripancreatic fluid collections or inflammatory changes.  Spleen: Unremarkable.  Adrenals/Urinary Tract: Bilateral kidneys and adrenal glands are normal in appearance. No hydroureteronephrosis. Urinary bladder is normal in appearance.  Stomach/Bowel: The appearance of the stomach is normal. No pathologic dilatation of small bowel or colon. Normal appendix.  Vascular/Lymphatic: Mild aortic atherosclerosis, without evidence of aneurysm or dissection in the abdominal or pelvic vasculature. Vascular findings and measurements pertinent to potential TAVR procedure, as detailed below. No lymphadenopathy noted in the abdomen or pelvis.  Reproductive: Status post hysterectomy. Ovaries are not confidently identified may be surgically absent or atrophic.  Other: No significant volume of ascites.  No pneumoperitoneum.  Musculoskeletal: There are no aggressive appearing lytic or blastic lesions noted in the visualized portions of the skeleton.  VASCULAR MEASUREMENTS PERTINENT TO TAVR:  AORTA:  Minimal Aortic Diameter-14 x 14 mm  Severity of Aortic Calcification-none  RIGHT PELVIS:  Right Common Iliac Artery -  Minimal Diameter-8.8 x 7.7 mm  Tortuosity - mild  Calcification-none  Right External Iliac Artery -  Minimal Diameter-6.4 x 6.4  mm  Tortuosity - mild  Calcification - none  Right Common Femoral Artery -  Minimal Diameter-6.8 x 6.6 mm  Tortuosity - mild  Calcification - none  LEFT PELVIS:  Left Common Iliac Artery -  Minimal Diameter-7.7 x 7.5 mm  Tortuosity - mild  Calcification - none  Left External Iliac Artery -  Minimal Diameter-6.5 x 7.1 mm  Tortuosity - mild  Calcification - none  Left Common Femoral Artery -  Minimal Diameter-7.0 x 7.0 mm  Tortuosity-mild  Calcification - none  Review of  the MIP images confirms the above findings.  IMPRESSION: 1. Vascular findings and measurements pertinent to potential TAVR procedure, as detailed above. 2. Severe thickening calcification of the aortic valve, compatible with the reported clinical history of severe aortic stenosis. 3. Aortic atherosclerosis, in addition to left anterior descending coronary artery disease. 4. Mild aneurysmal dilatation of the ascending thoracic aorta (4.5 cm in diameter). Ascending thoracic aortic aneurysm. Recommend semi-annual imaging followup by CTA or MRA and referral to cardiothoracic surgery if not already obtained. This recommendation follows 2010 ACCF/AHA/AATS/ACR/ASA/SCA/SCAI/SIR/STS/SVM Guidelines for the Diagnosis and Management of Patients With Thoracic Aortic Disease. Circulation. 2010; 121: H209-O709. Aortic aneurysm NOS (ICD10-I71.9) 5. Small hypervascular lesions in the liver favored to represent benign perfusion anomalies. This would require abdominal MRI with and without IV gadolinium for definitive characterization if of clinical concern.   Electronically Signed   By: Vinnie Langton M.D.   On: 11/15/2019 13:30  Impression:  This 74 year old active woman has stage D, severe, symptomatic aortic stenosis with New York Heart Association class II symptoms of exertional shortness of breath consistent with chronic diastolic congestive heart failure.  I have personally reviewed her 2D echocardiogram, cardiac catheterization, and CTA studies.  Her echocardiogram shows a severely calcified aortic valve with restricted mobility and a mean gradient of 40 mmHg consistent with severe aortic stenosis.  There is moderate aortic insufficiency.  The ascending aorta measured 44 mm.  Left ventricular ejection fraction was normal.  Cardiac catheterization showed no coronary disease.  Her gated cardiac CTA shows a bicuspid aortic valve with severe aortic stenosis and a 4.5 cm fusiform ascending aortic  aneurysm.  The aortic root appears unremarkable with a sinus diameter of 39 mm and a well-defined sinotubular junction with a diameter of 31 mm.  I discussed the options for treatment including open surgical aortic valve replacement and replacement of the ascending aortic aneurysm as well as transcatheter aortic valve replacement and continued follow-up of the ascending aortic aneurysm with yearly CT scanning.  I discussed the benefits and risk of both approaches.  Since she has a bicuspid aortic valve and the ascending aorta is at 4.5 cm I would favor open surgical aortic valve replacement and supra coronary replacement of the ascending aortic aneurysm.  I think this would significantly lower the risk of perivalvular leak, structural valve deterioration requiring further intervention, continued follow-up of her aneurysm, acute aortic dissection, and possible need for replacement of her ascending aorta in the future with a TAVR valve in place.  I discussed all this with the patient and her husband and they are in agreement with proceeding with open surgery.  I recommended using a bioprosthetic valve and they are in agreement with that.  I discussed the operative procedure with the patient and her husband including alternatives, benefits and risks; including but not limited to bleeding, blood transfusion, infection, stroke, myocardial infarction, graft failure, heart block requiring a permanent pacemaker, organ dysfunction, and death.  Joann Barry  Joann Barry understands and agrees to proceed.     Plan:  She will be scheduled for open surgical aortic valve replacement and replacement of the ascending aortic aneurysm in the next few weeks.  I spent 70 minutes performing this consultation and > 50% of this time was spent face to face counseling and coordinating the care of this patient's severe symptomatic aortic stenosis and ascending aortic aneurysm.     Gaye Pollack, MD 11/16/2019 1:08 PM

## 2019-11-17 ENCOUNTER — Other Ambulatory Visit: Payer: Self-pay | Admitting: *Deleted

## 2019-11-17 ENCOUNTER — Encounter: Payer: Self-pay | Admitting: *Deleted

## 2019-11-17 DIAGNOSIS — I350 Nonrheumatic aortic (valve) stenosis: Secondary | ICD-10-CM

## 2019-11-17 DIAGNOSIS — I712 Thoracic aortic aneurysm, without rupture, unspecified: Secondary | ICD-10-CM

## 2019-12-01 NOTE — Progress Notes (Signed)
[place], [place] [address] Phone: [phone] Fax: [phone]  [place], [address], [address] [place], [place] [address] [place] [address] Phone: [phone] Fax: [phone]      Your procedure is scheduled on September 21  Report to [facility] Main Entrance "A" at 0530 A.M., and check in at the Admitting office.  Call this number if you have problems the morning of surgery:  [phone]  Call [phone] if you have any questions prior to your surgery date Monday-Friday 8am-4pm    Remember:  Do not eat or drink after midnight the night before your surgery     Take these medicines the morning of surgery with A SIP OF WATER  atenolol (TENORMIN)  fluticasone (FLONASE) if needed levothyroxine (SYNTHROID  As of today, STOP taking any Aspirin (unless otherwise instructed by your surgeon) Aleve, Naproxen, Ibuprofen, Motrin, Advil, Goody's, BC's, all herbal medications, fish oil, and all vitamins.                      Do not wear jewelry, make up, or nail polish            Do not wear lotions, powders, perfumes/colognes, or deodorant.            Do not shave 48 hours prior to surgery.              Do not bring valuables to the hospital.            [facility] is not responsible for any belongings or valuables.  Do NOT Smoke (Tobacco/Vaping) or drink Alcohol 24 hours prior to your procedure If you use a CPAP at night, you may bring all equipment for your overnight stay.   Contacts, glasses, dentures or bridgework may not be worn into surgery.      For patients admitted to the hospital, discharge time will be determined by your treatment team.   Patients discharged the day of surgery will not be allowed to drive home, and someone needs to stay with them for 24 hours.    Special instructions:   [place]- Preparing For Surgery  Before surgery,  you can play an important role. Because skin is not sterile, your skin needs to be as free of germs as possible. You can reduce the number of germs on your skin by washing with CHG (chlorahexidine gluconate) Soap before surgery.  CHG is an antiseptic cleaner which kills germs and bonds with the skin to continue killing germs even after washing.    Oral Hygiene is also important to reduce your risk of infection.  Remember - BRUSH YOUR TEETH THE MORNING OF SURGERY WITH YOUR REGULAR TOOTHPASTE  Please do not use if you have an allergy to CHG or antibacterial soaps. If your skin becomes reddened/irritated stop using the CHG.  Do not shave (including legs and underarms) for at least 48 hours prior to first CHG shower. It is OK to shave your face.  Please follow these instructions carefully.   1. Shower the NIGHT BEFORE SURGERY and the MORNING OF SURGERY with CHG Soap.   2. If you chose to wash your hair, wash your hair first as usual with your normal shampoo.  3. After you shampoo, rinse your hair and body thoroughly to remove the shampoo.  4. Use CHG as you would any other liquid soap.  You can apply CHG directly to the skin and wash gently with a scrungie or a clean washcloth.   5. Apply the CHG Soap to your body ONLY FROM THE NECK DOWN.  Do not use on open wounds or open sores. Avoid contact with your eyes, ears, mouth and genitals (private parts). Wash Face and genitals (private parts)  with your normal soap.   6. Wash thoroughly, paying special attention to the area where your surgery will be performed.  7. Thoroughly rinse your body with warm water from the neck down.  8. DO NOT shower/wash with your normal soap after using and rinsing off the CHG Soap.  9. Pat yourself dry with a CLEAN TOWEL.  10. Wear CLEAN PAJAMAS to bed the night before surgery  11. Place CLEAN SHEETS on your bed the night of your first shower and DO NOT SLEEP WITH PETS.   Day of Surgery: Wear Clean/Comfortable  clothing the morning of surgery Do not apply any deodorants/lotions.   Remember to brush your teeth WITH YOUR REGULAR TOOTHPASTE.   Please read over the following fact sheets that you were given.

## 2019-12-02 ENCOUNTER — Encounter (HOSPITAL_COMMUNITY)
Admission: RE | Admit: 2019-12-02 | Discharge: 2019-12-02 | Disposition: A | Discharge status: Home / Self Care | Payer: Medicare Other | Source: Ambulatory Visit | Attending: Surgery | Admitting: Surgery

## 2019-12-02 ENCOUNTER — Encounter (HOSPITAL_COMMUNITY): Payer: Self-pay

## 2019-12-02 ENCOUNTER — Other Ambulatory Visit: Payer: Self-pay

## 2019-12-02 ENCOUNTER — Ambulatory Visit (HOSPITAL_COMMUNITY)
Admission: RE | Admit: 2019-12-02 | Discharge: 2019-12-02 | Disposition: A | Discharge status: Home / Self Care | Payer: Medicare Other | Source: Ambulatory Visit | Attending: Surgery | Admitting: Surgery

## 2019-12-02 DIAGNOSIS — Z01818 Encounter for other preprocedural examination: Secondary | ICD-10-CM | POA: Insufficient documentation

## 2019-12-02 DIAGNOSIS — E785 Hyperlipidemia, unspecified: Secondary | ICD-10-CM | POA: Diagnosis not present

## 2019-12-02 DIAGNOSIS — I350 Nonrheumatic aortic (valve) stenosis: Secondary | ICD-10-CM | POA: Diagnosis not present

## 2019-12-02 DIAGNOSIS — I712 Thoracic aortic aneurysm, without rupture, unspecified: Secondary | ICD-10-CM

## 2019-12-02 DIAGNOSIS — I10 Essential (primary) hypertension: Secondary | ICD-10-CM | POA: Insufficient documentation

## 2019-12-02 LAB — URINALYSIS, ROUTINE W REFLEX MICROSCOPIC
Bacteria, UA: NONE SEEN
Bilirubin Urine: NEGATIVE
Glucose, UA: NEGATIVE mg/dL
Hgb urine dipstick: NEGATIVE
Ketones, ur: NEGATIVE mg/dL
Nitrite: NEGATIVE
Protein, ur: NEGATIVE mg/dL
Specific Gravity, Urine: 1.016 (ref 1.005–1.030)
pH: 6 (ref 5.0–8.0)

## 2019-12-02 LAB — COMPREHENSIVE METABOLIC PANEL
ALT: 20 U/L (ref 0–44)
AST: 22 U/L (ref 15–41)
Albumin: 4 g/dL (ref 3.5–5.0)
Alkaline Phosphatase: 58 U/L (ref 38–126)
Anion gap: 11 (ref 5–15)
BUN: 14 mg/dL (ref 8–23)
CO2: 22 mmol/L (ref 22–32)
Calcium: 9.2 mg/dL (ref 8.9–10.3)
Chloride: 104 mmol/L (ref 98–111)
Creatinine, Ser: 0.72 mg/dL (ref 0.44–1.00)
GFR calc Af Amer: >60 mL/min (ref >60)
GFR calc non Af Amer: >60 mL/min (ref >60)
Glucose, Bld: 105 mg/dL — ABNORMAL HIGH (ref 70–99)
Potassium: 4.6 mmol/L (ref 3.5–5.1)
Sodium: 137 mmol/L (ref 135–145)
Total Bilirubin: 0.8 mg/dL (ref 0.3–1.2)
Total Protein: 6.8 g/dL (ref 6.5–8.1)

## 2019-12-02 LAB — BLOOD GAS, ARTERIAL
Acid-Base Excess: 0.2 mmol/L (ref 0.0–2.0)
Bicarbonate: 23.9 mmol/L (ref 20.0–28.0)
FIO2: 21
O2 Saturation: 98.9 %
Patient temperature: 37
pCO2 arterial: 35.6 mmHg (ref 32.0–48.0)
pH, Arterial: 7.442 (ref 7.350–7.450)
pO2, Arterial: 116 mmHg — ABNORMAL HIGH (ref 83.0–108.0)

## 2019-12-02 LAB — CBC
HCT: 37.8 % (ref 36.0–46.0)
Hemoglobin: 12.6 g/dL (ref 12.0–15.0)
MCH: 30.4 pg (ref 26.0–34.0)
MCHC: 33.3 g/dL (ref 30.0–36.0)
MCV: 91.3 fL (ref 80.0–100.0)
Platelets: 254 10*3/uL (ref 150–400)
RBC: 4.14 MIL/uL (ref 3.87–5.11)
RDW: 13.2 % (ref 11.5–15.5)
WBC: 7.9 10*3/uL (ref 4.0–10.5)
nRBC: 0 % (ref 0.0–0.2)

## 2019-12-02 LAB — SURGICAL PCR SCREEN
MRSA, PCR: NEGATIVE
Staphylococcus aureus: NEGATIVE

## 2019-12-02 LAB — APTT: aPTT: 31 seconds (ref 24–36)

## 2019-12-02 LAB — PROTIME-INR
INR: 1 (ref 0.8–1.2)
Prothrombin Time: 12.4 seconds (ref 11.4–15.2)

## 2019-12-02 LAB — HEMOGLOBIN A1C
Hgb A1c MFr Bld: 5.6 % (ref 4.8–5.6)
Mean Plasma Glucose: 114.02 mg/dL

## 2019-12-02 NOTE — Progress Notes (Signed)
PCP:  [name], MD Cardiologist:  [name], MD  EKG:  12/02/19 CXR:  12/02/19 ECHO:  08/24/19 Stress Test:  Denies Cardiac Cath:  11/11/19  No covid test as patient was positive 11/05/19  Anesthesia Review:  Yes, cardiac history and abnormal EKG  Patient denies shortness of breath, fever, cough, and chest pain at PAT appointment.  Patient verbalized understanding of instructions provided today at the PAT appointment.  Patient asked to review instructions at home and day of surgery.

## 2019-12-05 MED ORDER — PHENYLEPHRINE HCL-NACL 20-0.9 MG/250ML-% IV SOLN
30.0000 ug/min | INTRAVENOUS | Status: AC
  Administered 2019-12-06: 15 ug/min via INTRAVENOUS
  Filled 2019-12-05: qty 250

## 2019-12-05 MED ORDER — SODIUM CHLORIDE 0.9 % IV SOLN
750.0000 mg | INTRAVENOUS | Status: AC
  Administered 2019-12-06: 750 mg via INTRAVENOUS
  Filled 2019-12-05: qty 750

## 2019-12-05 MED ORDER — TRANEXAMIC ACID (OHS) BOLUS VIA INFUSION
15.0000 mg/kg | INTRAVENOUS | Status: AC
  Administered 2019-12-06: 1074 mg via INTRAVENOUS
  Filled 2019-12-05: qty 1074

## 2019-12-05 MED ORDER — SODIUM CHLORIDE 0.9 % IV SOLN
1.5000 g | INTRAVENOUS | Status: AC
  Administered 2019-12-06: 1.5 g via INTRAVENOUS
  Filled 2019-12-05: qty 1.5

## 2019-12-05 MED ORDER — EPINEPHRINE HCL 5 MG/250ML IV SOLN IN NS
0.0000 ug/min | INTRAVENOUS | Status: DC
  Filled 2019-12-05: qty 250

## 2019-12-05 MED ORDER — SODIUM CHLORIDE 0.9 % IV SOLN
INTRAVENOUS | Status: DC
  Filled 2019-12-05: qty 30

## 2019-12-05 MED ORDER — NITROGLYCERIN IN D5W 200-5 MCG/ML-% IV SOLN
2.0000 ug/min | INTRAVENOUS | Status: DC
  Filled 2019-12-05: qty 250

## 2019-12-05 MED ORDER — TRANEXAMIC ACID 1000 MG/10ML IV SOLN
1.5000 mg/kg/h | INTRAVENOUS | Status: AC
  Administered 2019-12-06: 1.5 mg/kg/h via INTRAVENOUS
  Filled 2019-12-05: qty 25

## 2019-12-05 MED ORDER — VANCOMYCIN HCL 1250 MG/250ML IV SOLN
1250.0000 mg | INTRAVENOUS | Status: AC
  Administered 2019-12-06: 1250 mg via INTRAVENOUS
  Filled 2019-12-05: qty 250

## 2019-12-05 MED ORDER — PLASMA-LYTE 148 IV SOLN
INTRAVENOUS | Status: DC
  Filled 2019-12-05: qty 2.5

## 2019-12-05 MED ORDER — MAGNESIUM SULFATE 50 % IJ SOLN
40.0000 meq | INTRAMUSCULAR | Status: DC
  Filled 2019-12-05: qty 9.85

## 2019-12-05 MED ORDER — NOREPINEPHRINE 4 MG/250ML-% IV SOLN
0.0000 ug/min | INTRAVENOUS | Status: DC
  Filled 2019-12-05: qty 250

## 2019-12-05 MED ORDER — DEXMEDETOMIDINE HCL IN NACL 400 MCG/100ML IV SOLN
0.1000 ug/kg/h | INTRAVENOUS | Status: AC
  Administered 2019-12-06: .7 ug/kg/h via INTRAVENOUS
  Filled 2019-12-05: qty 100

## 2019-12-05 MED ORDER — POTASSIUM CHLORIDE 2 MEQ/ML IV SOLN
80.0000 meq | INTRAVENOUS | Status: DC
  Filled 2019-12-05: qty 40

## 2019-12-05 MED ORDER — INSULIN REGULAR(HUMAN) IN NACL 100-0.9 UT/100ML-% IV SOLN
INTRAVENOUS | Status: AC
  Administered 2019-12-06: 1.2 [IU]/h via INTRAVENOUS
  Filled 2019-12-05: qty 100

## 2019-12-05 MED ORDER — MILRINONE LACTATE IN DEXTROSE 20-5 MG/100ML-% IV SOLN
0.3000 ug/kg/min | INTRAVENOUS | Status: DC
  Filled 2019-12-05: qty 100

## 2019-12-05 MED ORDER — TRANEXAMIC ACID (OHS) PUMP PRIME SOLUTION
2.0000 mg/kg | INTRAVENOUS | Status: DC
  Filled 2019-12-05: qty 1.43

## 2019-12-05 NOTE — Anesthesia Preprocedure Evaluation (Addendum)
Anesthesia Evaluation  Patient identified by MRN, date of birth, ID band Patient awake    Reviewed: Patient's Chart, lab work & pertinent test results  Airway Mallampati: II  TM Distance: >3 FB Neck ROM: Full    Dental  (+) Teeth Intact   Pulmonary neg pulmonary ROS,    Pulmonary exam normal        Cardiovascular hypertension, Pt. on medications + Valvular Problems/Murmurs AS and AI  Rhythm:Regular Rate:Normal + Systolic murmurs Most recent echo Doppler study has shown a peak gradient of 66, mean gradient of 44, aortic valve area of 0.5 cm, dimensionless index of 0.16   Neuro/Psych Anxiety negative neurological ROS     GI/Hepatic Neg liver ROS, Colon polyps   Endo/Other  S/p thyroidectomy 01/21  Renal/GU negative Renal ROS  negative genitourinary   Musculoskeletal  (+) Arthritis , Osteoarthritis,    Abdominal (+)  Abdomen: soft. Bowel sounds: normal.  Peds negative pediatric ROS (+)  Hematology negative hematology ROS (+)   Anesthesia Other Findings   Reproductive/Obstetrics                           Anesthesia Physical Anesthesia Plan  ASA: IV  Anesthesia Plan: General   Post-op Pain Management:    Induction: Intravenous  PONV Risk Score and Plan: 3 and Dexamethasone and Ondansetron  Airway Management Planned: Oral ETT and Mask  Additional Equipment: Arterial line, CVP, PA Cath, TEE, 3D TEE and Ultrasound Guidance Line Placement  Intra-op Plan: Delibrate Circulatory arrest per surgeon request  Post-operative Plan: Post-operative intubation/ventilation  Informed Consent: I have reviewed the patients History and Physical, chart, labs and discussed the procedure including the risks, benefits and alternatives for the proposed anesthesia with the patient or authorized representative who has indicated his/her understanding and acceptance.     Dental advisory given  Plan  Discussed with: CRNA and Surgeon  Anesthesia Plan Comments: (Lab Results      Component                Value               Date                      WBC                      7.9                 12/02/2019                HGB                      12.6                12/02/2019                HCT                      37.8                12/02/2019                MCV                      91.3                12/02/2019  PLT                      254                 12/02/2019           Cardiac cath 11/11/19: Previous echo Doppler determination of severe aortic stenosis with a peak  gradient of 66, mean gradient of 40, DI of 0.16 and AVA of 0.5 cm.  ECHO 06/21: 1. Left ventricular ejection fraction, by estimation, is 55 to 60%. The  left ventricle has normal function. The left ventricle has no regional  wall motion abnormalities. There is mild left ventricular hypertrophy.  Left ventricular diastolic parameters  are consistent with Grade I diastolic dysfunction (impaired relaxation).  The average left ventricular global longitudinal strain is -16.7 %.  2. Right ventricular systolic function is normal. The right ventricular  size is normal.  3. Left atrial size was mildly dilated.  4. The mitral valve is normal in structure. Mild mitral valve  regurgitation. No evidence of mitral stenosis.  5. The aortic valve is normal in structure. Aortic valve regurgitation is  moderate. Severe aortic valve stenosis. Aortic valve area, by VTI measures  0.51 cm. Aortic valve mean gradient measures 40.0 mmHg. Aortic valve Vmax  measures 4.07 m/s.  6. Aortic dilatation noted. There is moderate dilatation of the ascending  aorta measuring 44 mm.  7. The inferior vena cava is normal in size with greater than 50%  respiratory variability, suggesting right atrial pressure of 3 mmHg.  CTA coronaries 08/21: Aortic Valve: Functionally bicuspid with fused right and left cusps calcified with  restricted leaflet motion AV calcium score of 2741 suggesting severe AS Aorta: Moderately dilated aortic root with normal arch vessels and mild calcific atherosclerosis Sino-tubular Junction: 31 mm Ascending Thoracic Aorta: 43 mm Aortic Arch: 34 mm Descending Thoracic Aorta: 26 mm Sinus of Valsalva Measurements: Commissural: 29.6 mm Long Axis: 38.7 mm   )       Anesthesia Quick Evaluation

## 2019-12-05 NOTE — H&P (Signed)
[address]       ,[place] [address]             [phone]      Cardiothoracic Surgery Admission History and Physical   Referring Provider is [name], MD  Primary Cardiologist is [name], MD  PCP is [name], MD      Chief Complaint  Patient presents with  . Aortic Stenosis       HPI:  The patient is a 74 year old active woman with a history of hypertension, hyperlipidemia, and aortic stenosis who was referred for consideration of TAVR versus open AVR.  She said that she was diagnosed with a heart murmur in 2018 by [name] and was referred to Dr. [name] for evaluation. An echocardiogram in 2018 showed a mean gradient of 28 mmHg and a peak gradient of 45 mmHg with a dimensionless index of 0.24. She was asymptomatic at that time. A follow-up echo in 2019 showed an increase in the mean gradient to 34 mmHg with a dimensionless index of 0.19. She said that she continued to feel well. Continued follow-up was recommended. She had a follow-up echocardiogram on 08/24/2019 which showed a further increase in the mean gradient to 40 mmHg consistent with severe aortic stenosis with a dimensionless index of 0.16 and a calculated aortic valve area of 0.5 cm. Left ventricular ejection fraction was 55 to 60%. The ascending aorta was measured at 44 mm and was unchanged from her echo in 2018 was measured at 45 mm. She subsequently underwent cardiac catheterization on 11/11/2019 which showed mildly elevated PA pressures of 38/23. There was no coronary disease.  She denies any fatigue or chest discomfort. She has had some exertional shortness of breath with carrying groceries or feeling stressed. She denies any dizziness or syncope. She has had no orthopnea or PND. She denies any peripheral edema. 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 She does report being tired at the end of the day.      Past Medical History:   Diagnosis Date  . Anxiety   . Aortic stenosis    documented on cardiac clearance note  . Arthritis   . Elevated lipoprotein A level 04/19/2019  . Heart murmur   . Hypercholesterolemia   . Hypertension   . Personal history of colonic polyps   . S/P thyroidectomy 03/30/2019  . Thyroid nodule    Bilateral        Past Surgical History:  Procedure Laterality Date  . COLONOSCOPY  01/18/2014  . PARTIAL HYSTERECTOMY  1980  . POLYPECTOMY    . RIGHT HEART CATH AND CORONARY ANGIOGRAPHY N/A 11/11/2019   Procedure: RIGHT HEART CATH AND CORONARY ANGIOGRAPHY; Surgeon: [name], MD; Location: [facility]; Service: Cardiovascular; Laterality: N/A;  . THYROIDECTOMY  03/30/2019  . THYROIDECTOMY N/A 03/30/2019   Procedure: TOTAL THYROIDECTOMY; Surgeon: [name], MD; [facility]; Service: General; Laterality: N/A;        Family History  Problem Relation Age of Onset  . Pancreatic cancer Mother   . Colon cancer Neg Hx   . Rectal cancer Neg Hx   . Stomach cancer Neg Hx   . Esophageal cancer Neg Hx   . Colon polyps Neg Hx    Social History        Socioeconomic History  . Marital status: Married    Spouse name: Not on file  .  Number of children: Not on file  . Years of education: Not on file  . Highest education level: Not on file  Occupational History  . Not on file  Tobacco Use  . Smoking status: Never Smoker  . Smokeless tobacco: Never Used  Vaping Use  . Vaping Use: Never used  Substance and Sexual Activity  . Alcohol use: No  . Drug use: No  . Sexual activity: Not on file  Other Topics Concern  . Not on file  Social History Narrative  . Not on file   Social Determinants of Health      Financial Resource Strain:   . Difficulty of Paying Living Expenses: Not on file  Food Insecurity:   . Worried About Charity fundraiser in the Last Year: Not on file  . Ran Out of Food in the Last Year: Not on file  Transportation Needs:   . Lack of  Transportation (Medical): Not on file  . Lack of Transportation (Non-Medical): Not on file  Physical Activity:   . Days of Exercise per Week: Not on file  . Minutes of Exercise per Session: Not on file  Stress:   . Feeling of Stress : Not on file  Social Connections:   . Frequency of Communication with Friends and Family: Not on file  . Frequency of Social Gatherings with Friends and Family: Not on file  . Attends Religious Services: Not on file  . Active Member of Clubs or Organizations: Not on file  . Attends Archivist Meetings: Not on file  . Marital Status: Not on file  Intimate Partner Violence:   . Fear of Current or Ex-Partner: Not on file  . Emotionally Abused: Not on file  . Physically Abused: Not on file  . Sexually Abused: Not on file         Current Outpatient Medications  Medication Sig Dispense Refill  . atenolol (TENORMIN) 50 MG tablet Take 50 mg by mouth daily.    . cholecalciferol (VITAMIN D3) 25 MCG (1000 UNIT) tablet Take 1,000 Units by mouth in the morning and at bedtime.     . Evolocumab (REPATHA SURECLICK) 324 MG/ML SOAJ Inject 140 mg into the skin every 14 (fourteen) days. 2 pen 11  . fluticasone (FLONASE) 50 MCG/ACT nasal spray Place 2 sprays into both nostrils daily as needed for allergies.     Marland Kitchen levothyroxine (SYNTHROID) 112 MCG tablet Take 1 tablet (112 mcg total) by mouth daily. 30 tablet 11  . losartan (COZAAR) 50 MG tablet Take 50mg  (1 tab) in the AM and 25mg  (1/2 tab) in the PM (Patient taking differently: Take 25-50 mg by mouth See admin instructions. Take 50mg  in the AM and 25mg  in the PM) 135 tablet 3  . meclizine (ANTIVERT) 25 MG tablet Take 25 mg by mouth every 8 (eight) hours as needed for dizziness.     . Multiple Vitamins-Minerals (CENTRUM SILVER 50+WOMEN PO) Take 1 tablet by mouth daily.     . rosuvastatin (CRESTOR) 20 MG tablet Take 1 tablet (20 mg total) by mouth every evening. 90 tablet 3  . zolpidem (AMBIEN) 10 MG tablet Take 5 mg  by mouth at bedtime as needed for sleep.      No current facility-administered medications for this visit.       Allergies  Allergen Reactions  . Sulfonamide Derivatives Swelling   Review of Systems:   General: normal appetite, + decreased energy, no weight gain, no weight loss, no fever  Cardiac: no chest pain with exertion, no chest pain at rest, +SOB with moderate exertion, no resting SOB, no PND, no orthopnea, no palpitations, no arrhythmia, no atrial fibrillation, no LE edema, no dizzy spells, no syncope  Respiratory: + exertional shortness of breath, no home oxygen, no productive cough, no dry cough, no bronchitis, no wheezing, no hemoptysis, no asthma, no pain with inspiration or cough, no sleep apnea, no CPAP at night  GI: no difficulty swallowing, no reflux, no frequent heartburn, no hiatal hernia, no abdominal pain, no constipation, no diarrhea, no hematochezia, no hematemesis, no melena  GU: no dysuria, no frequency, no urinary tract infection, no hematuria, no kidney stones, no kidney disease  Vascular: no pain suggestive of claudication, no pain in feet, no leg cramps, no varicose veins, no DVT, no non-healing foot ulcer  Neuro: no stroke, no TIA's, no seizures, no headaches, no temporary blindness one eye, no slurred speech, no peripheral neuropathy, no chronic pain, no instability of gait, no memory/cognitive dysfunction  Musculoskeletal: no arthritis, no joint swelling, no myalgias, no difficulty walking, normal mobility  Skin: no rash, no itching, no skin infections, no pressure sores or ulcerations  Psych: no anxiety, no depression, no nervousness, no unusual recent stress  Eyes: no blurry vision, no floaters, no recent vision changes, does not wear glasses or contacts  ENT: no hearing loss, no loose or painful teeth, no dentures, last saw dentist every 6 months.  Hematologic: no easy bruising, no abnormal bleeding, no clotting disorder, no frequent epistaxis  Endocrine: no  diabetes, does not check CBG's at home    Physical Exam:   BP (!) 150/72  Pulse 65  Temp 97.6 F (36.4 C) (Skin)  Resp 20  Ht 5' 4.5" (1.638 m)  Wt 160 lb (72.6 kg)  SpO2 95% Comment: RA  BMI 27.04 kg/m  General: Looks younger than her age, well-appearing  HEENT: Unremarkable, NCAT, PERLA, EOMI  Neck: no JVD, no bruits, no adenopathy  Chest: clear to auscultation, symmetrical breath sounds, no wheezes, no rhonchi  CV: RRR, grade lll/VI crescendo/decrescendo murmur heard best at RSB, no diastolic murmur  Abdomen: soft, non-tender, no masses  Extremities: warm, well-perfused, pulses palpable at ankle, no LE edema  Rectal/GU Deferred  Neuro: Grossly non-focal and symmetrical throughout  Skin: Clean and dry, no rashes, no breakdown  Diagnostic Tests:  ECHOCARDIOGRAM REPORT     Patient Name: Joann Barry Date of Exam: 08/24/2019  Medical Rec #: [id number] Height: 64.5 in  Accession #: [id number] Weight: 161.0 lb  Date of Birth: 12/03/1945 BSA: 1.794 m  Patient Age: 20 years BP: 188/89 mmHg  Patient Gender: F HR: 59 bpm.  Exam Location: [address]   Procedure: 2D Echo, Cardiac Doppler, Color Doppler and Strain Analysis   Indications: I35.0 Aortic Stenosis   History: Patient has prior history of Echocardiogram examinations,  most  recent 02/28/2019. Thoracic aortic aneurysm; Risk  Factors:Hypertension.   Sonographer: Marygrace Drought RCS  Referring Phys: [phone] HAO MENG   IMPRESSIONS    1. Left ventricular ejection fraction, by estimation, is 55 to 60%. The  left ventricle has normal function. The left ventricle has no regional  wall motion abnormalities. There is mild left ventricular hypertrophy.  Left ventricular diastolic parameters  are consistent with Grade I diastolic dysfunction (impaired relaxation).  The average left ventricular global longitudinal strain is -16.7 %.  2. Right ventricular systolic function is normal. The right ventricular  size is  normal.  3. Left atrial size  was mildly dilated.  4. The mitral valve is normal in structure. Mild mitral valve  regurgitation. No evidence of mitral stenosis.  5. The aortic valve is normal in structure. Aortic valve regurgitation is  moderate. Severe aortic valve stenosis. Aortic valve area, by VTI measures  0.51 cm. Aortic valve mean gradient measures 40.0 mmHg. Aortic valve Vmax  measures 4.07 m/s.  6. Aortic dilatation noted. There is moderate dilatation of the ascending  aorta measuring 44 mm.  7. The inferior vena cava is normal in size with greater than 50%  respiratory variability, suggesting right atrial pressure of 3 mmHg.   Comparison(s): Changes from prior study are noted. Aortic stenosis is  severe when compared to prior.   FINDINGS  Left Ventricle: Left ventricular ejection fraction, by estimation, is 55  to 60%. The left ventricle has normal function. The left ventricle has no  regional wall motion abnormalities. The average left ventricular global  longitudinal strain is -16.7 %.  The left ventricular internal cavity size was normal in size. There is  mild left ventricular hypertrophy. Left ventricular diastolic parameters  are consistent with Grade I diastolic dysfunction (impaired relaxation).   Right Ventricle: The right ventricular size is normal. No increase in  right ventricular wall thickness. Right ventricular systolic function is  normal.   Left Atrium: Left atrial size was mildly dilated.   Right Atrium: Right atrial size was normal in size.   Pericardium: There is no evidence of pericardial effusion.   Mitral Valve: The mitral valve is normal in structure. Normal mobility of  the mitral valve leaflets. Mild mitral valve regurgitation. No evidence of  mitral valve stenosis.   Tricuspid Valve: The tricuspid valve is normal in structure. Tricuspid  valve regurgitation is not demonstrated. No evidence of tricuspid  stenosis.   Aortic Valve: The  aortic valve is normal in structure.. There is severe  thickening and severe calcifcation of the aortic valve. Aortic valve  regurgitation is moderate. Aortic regurgitation PHT measures 488 msec.  Severe aortic stenosis is present. There  is severe thickening of the aortic valve. There is severe calcifcation of  the aortic valve. Aortic valve mean gradient measures 40.0 mmHg. Aortic  valve peak gradient measures 66.3 mmHg. Aortic valve area, by VTI measures  0.51 cm.   Pulmonic Valve: The pulmonic valve was normal in structure. Pulmonic valve  regurgitation is trivial. No evidence of pulmonic stenosis.   Aorta: Aortic dilatation noted. There is moderate dilatation of the  ascending aorta measuring 44 mm.   Venous: The inferior vena cava is normal in size with greater than 50%  respiratory variability, suggesting right atrial pressure of 3 mmHg.   IAS/Shunts: No atrial level shunt detected by color flow Doppler.    LEFT VENTRICLE  PLAX 2D  LVIDd: 4.83 cm Diastology  LVIDs: 3.53 cm LV e' lateral: 5.87 cm/s  LV PW: 1.51 cm LV E/e' lateral: 11.4  LV IVS: 1.31 cm LV e' medial: 4.46 cm/s  LVOT diam: 2.00 cm LV E/e' medial: 15.0  LV SV: 60  LV SV Index: 34 2D Longitudinal Strain  LVOT Area: 3.14 cm 2D Strain GLS Avg: -16.7 %    RIGHT VENTRICLE  RV Basal diam: 2.61 cm  RV S prime: 9.03 cm/s  TAPSE (M-mode): 2.1 cm   LEFT ATRIUM Index RIGHT ATRIUM Index  LA diam: 3.90 cm 2.17 cm/m RA Area: 15.20 cm  LA Vol (A2C): 78.6 ml 43.82 ml/m RA Volume: 34.70 ml 19.35 ml/m  LA  Vol (A4C): 38.2 ml 21.30 ml/m  LA Biplane Vol: 60.2 ml 33.56 ml/m  AORTIC VALVE  AV Area (Vmax): 0.55 cm  AV Area (Vmean): 0.46 cm  AV Area (VTI): 0.51 cm  AV Vmax: 407.00 cm/s  AV Vmean: 300.000 cm/s  AV VTI: 1.190 m  AV Peak Grad: 66.3 mmHg  AV Mean Grad: 40.0 mmHg  LVOT Vmax: 70.90 cm/s  LVOT Vmean: 43.700 cm/s  LVOT VTI: 0.192 m  LVOT/AV VTI ratio: 0.16  AI PHT: 488 msec   AORTA  Ao Root  diam: 2.90 cm   MITRAL VALVE  MV Area (PHT): SHUNTS  MV Decel Time: Systemic VTI: 0.19 m  MR Peak grad: 115.3 mmHg Systemic Diam: 2.00 cm  MR Mean grad: 75.0 mmHg  MR Vmax: 537.00 cm/s  MR Vmean: 401.0 cm/s  MR PISA: 1.01 cm  MR PISA Eff ROA: 6 mm  MR PISA Radius: 0.40 cm  MV E velocity: 66.90 cm/s  MV A velocity: 77.10 cm/s  MV E/A ratio: 0.87   Candee Furbish MD  Electronically signed by Candee Furbish MD  Signature Date/Time: 08/24/2019/11:00:42 AM    Physicians  Panel Physicians Referring Physician Case Authorizing Physician  [name], MD (Primary)    Procedures  RIGHT HEART CATH AND CORONARY ANGIOGRAPHY  Conclusion  Normal to mildly elevated right heart pressures with PA systolic pressure at 38 and mean pressure at 23.  Normal coronary arteries.  Previous echo Doppler determination of severe aortic stenosis with a peak gradient of 66, mean gradient of 40, DI of 0.16 and AVA of 0.5 cm.  RECOMMENDATION:  Patient will return to the structural heart team for further evaluation of TAVR candidacy. To further evaluate the size of her ascending aorta, she will be scheduled for a gated cardiac CTA as well as a CTA of the chest, abdomen, and pelvis.  Recommendations  Antiplatelet/Anticoag Patient will undergo evaluation for possible TAVR versus surgical aortic valve replacement.  Indications  Severe aortic stenosis [I35.0 (ICD-10-CM)]  Procedural Details  Technical Details Ms. Kaleiyah Polsky is a 74 year old female who has developed progressive aortic valve stenosis. Her most recent echo Doppler study has shown a peak gradient of 66, mean gradient of 44, aortic valve area of 0.5 cm, dimensionless index of 0.16. She is felt to have severe D1 aortic stenosis, functional class II. I referred her to Dr. [name] for consideration of TAVR candidacy. She also has a mildly dilated aortic root. She is now referred for definitive right and left heart catheterization with coronary  angiography.  The patient arrived to the catheterization laboratory in the fasting state. She was premedicated with Versed 2 mg and fentanyl 25 mcg. An IV was sent in the brachial vein. This was exchanged under sterile technique a 5 French glide sheath slender sheath. A 5 French right heart catheter was then inserted and pressures were recorded in the right atrium, right ventricle, pulmonary artery, and pulmonary capillary wedge positions. Oxygen saturation was obtained in the PA. Patient received an additional 1 mg of Versed and 25 mcg of fentanyl. Ultrasound guidance was used for right radial access. Right radial artery was punctured without difficulty and a 6 [place] Glidesheath slender sheath was inserted. Verapamil 3 mg was administered. Initially a take catheter was inserted but due to the dilated aortic root this was then removed. A JR4 catheter was used for selective angiography into the right coronary artery. A JL4 catheter was used for selective angiography into the left coronary system. Fluoroscopy revealed  a severely calcified aortic valve with reduced excursion. Since the patient has been documented to have severe aortic stenosis, no attempt was made to cross the aortic valve. The patient will undergo CTA imaging of her aorta as part of her TAVR work-up. All catheters were removed from the patient. Hemostasis was obtained for the brachial access. A TR band was inserted for right radial hemostasis. The patient left the catheterization laboratory chest pain-free with stable hemodynamics. Estimated blood loss <50 mL.   During this procedure medications were administered to achieve and maintain moderate conscious sedation while the patient's heart rate, blood pressure, and oxygen saturation were continuously monitored and I was present face-to-face 100% of this time.  Medications  (Filter: Administrations occurring from [phone] to 1052 on 11/11/19)  Heparin (Porcine) in NaCl 1000-0.9 UT/500ML-% SOLN  (mL)  Total volume: 1,000 mL  Date/Time  Rate/Dose/Volume Action  11/11/19 0937  500 mL Given  0938  500 mL Given  midazolam (VERSED) injection (mg)  Total dose: 3 mg  Date/Time  Rate/Dose/Volume Action  11/11/19 0940  2 mg Given  1011  1 mg Given  fentaNYL (SUBLIMAZE) injection (mcg)  Total dose: 75 mcg  Date/Time  Rate/Dose/Volume Action  11/11/19 0940  50 mcg Given  1011  25 mcg Given  lidocaine (PF) (XYLOCAINE) 1 % injection (mL)  Total volume: 4 mL  Date/Time  Rate/Dose/Volume Action  11/11/19 1011  2 mL Given  1020  2 mL Given  Radial Cocktail/Verapamil only (mL)  Total volume: 10 mL  Date/Time  Rate/Dose/Volume Action  11/11/19 1026  10 mL Given  heparin sodium (porcine) injection (Units)  Total dose: 3,500 Units  Date/Time  Rate/Dose/Volume Action  11/11/19 1029  3,500 Units Given  iohexol (OMNIPAQUE) 350 MG/ML injection (mL)  Total volume: 55 mL  Date/Time  Rate/Dose/Volume Action  11/11/19 1047  55 mL Given  Sedation Time  Sedation Time Physician-1: 1 hour 1 minute 44 seconds  Contrast  Medication Name Total Dose  iohexol (OMNIPAQUE) 350 MG/ML injection 55 mL  Radiation/Fluoro  Fluoro time: 7.1 (min)  DAP: 10789 (mGycm2)  Cumulative Air Kerma: 179 (mGy)  Coronary Findings  Diagnostic  Dominance: Right  Ramus Intermedius  Vessel is small.  Intervention  No interventions have been documented.  Right Heart  Right Heart Pressures RA: A-wave 7, V wave 6, mean 4 RV: 38/6 PA: 38/11; mean 23 PW; A-wave 18; V wave 17; mean 12  Central aortic pressure 111/47; mean 70  Oxygen saturation in the pulmonary artery 71% and in the central aorta 99%.  Fick method cardiac output 5.3 L/min with an index of 2.9 L/min/m.  PVR: 2.1 WU  Coronary Diagrams  Diagnostic  Dominance: Right   Intervention  Implants     No implant documentation for this case.  Syngo Images  Show images for CARDIAC CATHETERIZATION  Images on Long Term Storage  Show images for Melaine, [place]phe[place] to Procedure Log    Procedure Log  Hemo Data   Most Recent Value  Fick Cardiac Output 5.25 L/min  Fick Cardiac Output Index 2.91 (L/min)/BSA  RA A Wave 7 mmHg  RA V Wave 6 mmHg  RA Mean 4 mmHg  RV Systolic Pressure 38 mmHg  RV Diastolic Pressure 1 mmHg  RV EDP 6 mmHg  PA Systolic Pressure 38 mmHg  PA Diastolic Pressure 11 mmHg  PA Mean 23 mmHg  PW A Wave 18 mmHg  PW V Wave 17 mmHg  PW Mean 12 mmHg  AO Systolic Pressure 161 mmHg  AO Diastolic Pressure 47 mmHg  AO Mean 70 mmHg  QP/QS 1  TPVR Index 7.9 HRUI  TSVR Index 24.05 HRUI  PVR SVR Ratio 0.17  TPVR/TSVR Ratio 0.33     ADDENDUM REPORT: 11/15/2019 16:52  ADDENDUM:  Extracardiac findings were described separately under dictation for  contemporaneously obtained CTA chest, abdomen and pelvis dated  11/15/2019. Please see that report for full description of relevant  extracardiac findings.  Electronically Signed  By: Vinnie Langton M.D.  On: 11/15/2019 16:52   Addended by Etheleen Mayhew, MD on 11/15/2019 5:00 PM  Study Result  Addenda  ADDENDUM REPORT: 11/15/2019 16:52  ADDENDUM:  Extracardiac findings were described separately under dictation for  contemporaneously obtained CTA chest, abdomen and pelvis dated  11/15/2019. Please see that report for full description of relevant  extracardiac findings.  Electronically Signed  By: Vinnie Langton M.D.  On: 11/15/2019 16:52   Signed by Etheleen Mayhew, MD on 11/15/2019 5:00 PM  Narrative & Impression  CLINICAL DATA: Aortic Stenosis  EXAM:  Cardiac TAVR CT  TECHNIQUE:  The patient was scanned on a Siemens Force 096 slice scanner. A 120  kV retrospective scan was triggered in the ascending thoracic aorta  at 140 HU's. Gantry rotation speed was 250 msecs and collimation was  .6 mm. No beta blockade or nitro were given. The 3D data set was  reconstructed in 5% intervals of the R-R cycle. Systolic and  diastolic phases were analyzed on a  dedicated work station using  MPR, MIP and VRT modes. The patient received 80 cc of contrast.  FINDINGS:  Aortic Valve: Functionally bicuspid with fused right and left cusps  calcified with restricted leaflet motion AV calcium score of 2741  suggesting severe AS  Aorta: Moderately dilated aortic root with normal arch vessels and  mild calcific atherosclerosis  Sino-tubular Junction: 31 mm  Ascending Thoracic Aorta: 43 mm  Aortic Arch: 34 mm  Descending Thoracic Aorta: 26 mm  Sinus of Valsalva Measurements:  Commissural: 29.6 mm  Long Axis: 38.7 mm  Coronary Artery Height above Annulus:  Left Main: 14.65 mm above annulus  Right Coronary: 11.61 mm above annulus  Virtual Basal Annulus Measurements:  Maximum / Minimum Diameter: 27.8 mm x 21.5 mm  Perimeter: 80.4 mm  Area: 500.58 mm 2  Coronary Arteries: Sufficient height above annulus for deployment  Optimum Fluoroscopic Angle for Delivery: LAO 8 Caudal 5 degrees  IMPRESSION:  1. Bicuspid AV with calcium score of 2741 suggesting severe AS  2. Annular area of 500 mm2 suitable for a 26 mm Sapien 3 valve  3. Moderately dilated aortic root 4.3 cm  4. Optimal angiographic angle for deployment LAO 8 Caudal 5 degrees  5. Coronary arteries sufficient height above annulus for deployment  Jenkins Rouge  Electronically Signed:  By: Jenkins Rouge M.D.  On: 11/15/2019 12:00   CLINICAL DATA: 74 year old female with history of severe aortic  stenosis. Preprocedural study prior to potential transcatheter  aortic valve replacement (TAVR) procedure.  EXAM:  CT ANGIOGRAPHY CHEST, ABDOMEN AND PELVIS  TECHNIQUE:  Non-contrast CT of the chest was initially obtained.  Multidetector CT imaging through the chest, abdomen and pelvis was  performed using the standard protocol during bolus administration of  intravenous contrast. Multiplanar reconstructed images and MIPs were  obtained and reviewed to evaluate the vascular anatomy.  CONTRAST: 164mL  OMNIPAQUE IOHEXOL 350 MG/ML SOLN  COMPARISON: No priors.  FINDINGS:  CTA CHEST FINDINGS  Cardiovascular:  Heart size is mildly enlarged. There is no  significant pericardial fluid, thickening or pericardial  calcification. There is aortic atherosclerosis, as well as  atherosclerosis of the great vessels of the mediastinum and the  coronary arteries, including calcified atherosclerotic plaque in the  left anterior descending coronary artery. Mild aneurysmal dilatation  of the ascending thoracic aorta (4.5 cm in diameter). Severe  thickening and calcification of the aortic valve.  Mediastinum/Lymph Nodes: No pathologically enlarged mediastinal or  hilar lymph nodes. Esophagus is unremarkable in appearance. No  axillary lymphadenopathy.  Lungs/Pleura: No suspicious appearing pulmonary nodules or masses  are noted. No acute consolidative airspace disease. No pleural  effusions.  Musculoskeletal/Soft Tissues: There are no aggressive appearing  lytic or blastic lesions noted in the visualized portions of the  skeleton.  CTA ABDOMEN AND PELVIS FINDINGS  Hepatobiliary: Small hypervascular areas in segment 2 (axial image  90 of series 9) and segment 4A/8 (axial image 87 of series 9)  measuring up to 1.3 x 0.8 cm. No intra or extrahepatic biliary  ductal dilatation. Gallbladder is normal in appearance.  Pancreas: No pancreatic mass. No pancreatic ductal dilatation. No  pancreatic or peripancreatic fluid collections or inflammatory  changes.  Spleen: Unremarkable.  Adrenals/Urinary Tract: Bilateral kidneys and adrenal glands are  normal in appearance. No hydroureteronephrosis. Urinary bladder is  normal in appearance.  Stomach/Bowel: The appearance of the stomach is normal. No  pathologic dilatation of small bowel or colon. Normal appendix.  Vascular/Lymphatic: Mild aortic atherosclerosis, without evidence of  aneurysm or dissection in the abdominal or pelvic vasculature.  Vascular findings  and measurements pertinent to potential TAVR  procedure, as detailed below. No lymphadenopathy noted in the  abdomen or pelvis.  Reproductive: Status post hysterectomy. Ovaries are not confidently  identified may be surgically absent or atrophic.  Other: No significant volume of ascites. No pneumoperitoneum.  Musculoskeletal: There are no aggressive appearing lytic or blastic  lesions noted in the visualized portions of the skeleton.  VASCULAR MEASUREMENTS PERTINENT TO TAVR:  AORTA:  Minimal Aortic Diameter-14 x 14 mm  Severity of Aortic Calcification-none  RIGHT PELVIS:  Right Common Iliac Artery -  Minimal Diameter-8.8 x 7.7 mm  Tortuosity - mild  Calcification-none  Right External Iliac Artery -  Minimal Diameter-6.4 x 6.4 mm  Tortuosity - mild  Calcification - none  Right Common Femoral Artery -  Minimal Diameter-6.8 x 6.6 mm  Tortuosity - mild  Calcification - none  LEFT PELVIS:  Left Common Iliac Artery -  Minimal Diameter-7.7 x 7.5 mm  Tortuosity - mild  Calcification - none  Left External Iliac Artery -  Minimal Diameter-6.5 x 7.1 mm  Tortuosity - mild  Calcification - none  Left Common Femoral Artery -  Minimal Diameter-7.0 x 7.0 mm  Tortuosity-mild  Calcification - none  Review of the MIP images confirms the above findings.  IMPRESSION:  1. Vascular findings and measurements pertinent to potential TAVR  procedure, as detailed above.  2. Severe thickening calcification of the aortic valve, compatible  with the reported clinical history of severe aortic stenosis.  3. Aortic atherosclerosis, in addition to left anterior descending  coronary artery disease.  4. Mild aneurysmal dilatation of the ascending thoracic aorta (4.5  cm in diameter). Ascending thoracic aortic aneurysm. Recommend  semi-annual imaging followup by CTA or MRA and referral to  cardiothoracic surgery if not already obtained. This recommendation  follows 2010  ACCF/AHA/AATS/ACR/ASA/SCA/SCAI/SIR/STS/SVM Guidelines  for the Diagnosis and Management of Patients With Thoracic Aortic  Disease. Circulation. 2010; 121: B169-I503. Aortic aneurysm NOS  (ICD10-I71.9)  5. Small hypervascular lesions in the liver favored to represent  benign perfusion anomalies. This would require abdominal MRI with  and without IV gadolinium for definitive characterization if of  clinical concern.  Electronically Signed  By: Vinnie Langton M.D.  On: 11/15/2019 13:30    Impression:   This 74 year old active woman has stage D, severe, symptomatic aortic stenosis with New York Heart Association class II symptoms of exertional shortness of breath consistent with chronic diastolic congestive heart failure. I have personally reviewed her 2D echocardiogram, cardiac catheterization, and CTA studies. Her echocardiogram shows a severely calcified aortic valve with restricted mobility and a mean gradient of 40 mmHg consistent with severe aortic stenosis. There is moderate aortic insufficiency. The ascending aorta measured 44 mm. Left ventricular ejection fraction was normal. Cardiac catheterization showed no coronary disease. Her gated cardiac CTA shows a bicuspid aortic valve with severe aortic stenosis and a 4.5 cm fusiform ascending aortic aneurysm. The aortic root appears unremarkable with a sinus diameter of 39 mm and a well-defined sinotubular junction with a diameter of 31 mm.  I discussed the options for treatment including open surgical aortic valve replacement and replacement of the ascending aortic aneurysm as well as transcatheter aortic valve replacement and continued follow-up of the ascending aortic aneurysm with yearly CT scanning. I discussed the benefits and risk of both approaches. Since she has a bicuspid aortic valve and the ascending aorta is at 4.5 cm I would favor open surgical aortic valve replacement and supra coronary replacement of the ascending aortic aneurysm. I  think this would significantly lower the risk of perivalvular leak, structural valve deterioration requiring further intervention, continued follow-up of her aneurysm, acute aortic dissection, and possible need for replacement of her ascending aorta in the future with a TAVR valve in place. I discussed all this with the patient and her husband and they are in agreement with proceeding with open surgery. I recommended using a bioprosthetic valve and they are in agreement with that.  I discussed the operative procedure with the patient and her husband including alternatives, benefits and risks; including but not limited to bleeding, blood transfusion, infection, stroke, myocardial infarction, graft failure, heart block requiring a permanent pacemaker, organ dysfunction, and death. [name] understands and agrees to proceed.   Plan:   Open surgical aortic valve replacement and replacement of the ascending aortic aneurysm.   Gaye Pollack, MD

## 2019-12-06 ENCOUNTER — Inpatient Hospital Stay (HOSPITAL_COMMUNITY): Payer: Medicare Other

## 2019-12-06 ENCOUNTER — Inpatient Hospital Stay (HOSPITAL_COMMUNITY): Payer: Medicare Other | Admitting: Certified Registered Nurse Anesthetist

## 2019-12-06 ENCOUNTER — Encounter (HOSPITAL_COMMUNITY)
Admission: RE | Disposition: A | Discharge status: Home / Self Care | Payer: Self-pay | Source: Home / Self Care | Attending: Surgery

## 2019-12-06 ENCOUNTER — Encounter (HOSPITAL_COMMUNITY): Payer: Self-pay | Admitting: Surgery

## 2019-12-06 ENCOUNTER — Other Ambulatory Visit: Payer: Self-pay

## 2019-12-06 ENCOUNTER — Inpatient Hospital Stay (HOSPITAL_COMMUNITY)
Admission: RE | Admit: 2019-12-06 | Discharge: 2019-12-10 | DRG: 219 | Disposition: A | Discharge status: Home / Self Care | Payer: Medicare Other | Attending: Surgery | Admitting: Surgery

## 2019-12-06 DIAGNOSIS — I350 Nonrheumatic aortic (valve) stenosis: Secondary | ICD-10-CM

## 2019-12-06 DIAGNOSIS — I7100 Dissection of unspecified site of aorta: Secondary | ICD-10-CM | POA: Diagnosis present

## 2019-12-06 DIAGNOSIS — Z79899 Other long term (current) drug therapy: Secondary | ICD-10-CM

## 2019-12-06 DIAGNOSIS — E8770 Fluid overload, unspecified: Secondary | ICD-10-CM | POA: Diagnosis present

## 2019-12-06 DIAGNOSIS — Z952 Presence of prosthetic heart valve: Secondary | ICD-10-CM

## 2019-12-06 DIAGNOSIS — D62 Acute posthemorrhagic anemia: Secondary | ICD-10-CM | POA: Diagnosis not present

## 2019-12-06 DIAGNOSIS — I110 Hypertensive heart disease with heart failure: Secondary | ICD-10-CM | POA: Diagnosis present

## 2019-12-06 DIAGNOSIS — Z79890 Hormone replacement therapy (postmenopausal): Secondary | ICD-10-CM | POA: Diagnosis not present

## 2019-12-06 DIAGNOSIS — R05 Cough: Secondary | ICD-10-CM | POA: Diagnosis not present

## 2019-12-06 DIAGNOSIS — Z882 Allergy status to sulfonamides status: Secondary | ICD-10-CM | POA: Diagnosis not present

## 2019-12-06 DIAGNOSIS — E890 Postprocedural hypothyroidism: Secondary | ICD-10-CM | POA: Diagnosis present

## 2019-12-06 DIAGNOSIS — E7800 Pure hypercholesterolemia, unspecified: Secondary | ICD-10-CM | POA: Diagnosis present

## 2019-12-06 DIAGNOSIS — F419 Anxiety disorder, unspecified: Secondary | ICD-10-CM | POA: Diagnosis present

## 2019-12-06 DIAGNOSIS — I712 Thoracic aortic aneurysm, without rupture, unspecified: Secondary | ICD-10-CM

## 2019-12-06 DIAGNOSIS — I5032 Chronic diastolic (congestive) heart failure: Secondary | ICD-10-CM | POA: Diagnosis present

## 2019-12-06 DIAGNOSIS — Z953 Presence of xenogenic heart valve: Secondary | ICD-10-CM

## 2019-12-06 DIAGNOSIS — Z800 Family history of malignant neoplasm of digestive organs: Secondary | ICD-10-CM | POA: Diagnosis not present

## 2019-12-06 DIAGNOSIS — Z006 Encounter for examination for normal comparison and control in clinical research program: Secondary | ICD-10-CM

## 2019-12-06 DIAGNOSIS — R112 Nausea with vomiting, unspecified: Secondary | ICD-10-CM | POA: Diagnosis not present

## 2019-12-06 DIAGNOSIS — M1990 Unspecified osteoarthritis, unspecified site: Secondary | ICD-10-CM | POA: Diagnosis present

## 2019-12-06 DIAGNOSIS — R011 Cardiac murmur, unspecified: Secondary | ICD-10-CM | POA: Diagnosis present

## 2019-12-06 DIAGNOSIS — Z8616 Personal history of COVID-19: Secondary | ICD-10-CM

## 2019-12-06 DIAGNOSIS — Z8719 Personal history of other diseases of the digestive system: Secondary | ICD-10-CM

## 2019-12-06 DIAGNOSIS — Z9889 Other specified postprocedural states: Secondary | ICD-10-CM

## 2019-12-06 LAB — POCT I-STAT 7, (LYTES, BLD GAS, ICA,H+H)
Acid-Base Excess: 1 mmol/L (ref 0.0–2.0)
Acid-Base Excess: 5 mmol/L — ABNORMAL HIGH (ref 0.0–2.0)
Acid-Base Excess: 2 mmol/L (ref 0.0–2.0)
Acid-Base Excess: 0 mmol/L (ref 0.0–2.0)
Acid-Base Excess: 2 mmol/L (ref 0.0–2.0)
Acid-Base Excess: 3 mmol/L — ABNORMAL HIGH (ref 0.0–2.0)
Acid-base deficit: 3 mmol/L — ABNORMAL HIGH (ref 0.0–2.0)
Acid-base deficit: 4 mmol/L — ABNORMAL HIGH (ref 0.0–2.0)
Acid-base deficit: 6 mmol/L — ABNORMAL HIGH (ref 0.0–2.0)
Acid-base deficit: 4 mmol/L — ABNORMAL HIGH (ref 0.0–2.0)
Bicarbonate: 26.2 mmol/L (ref 20.0–28.0)
Bicarbonate: 28 mmol/L (ref 20.0–28.0)
Bicarbonate: 26.1 mmol/L (ref 20.0–28.0)
Bicarbonate: 25.2 mmol/L (ref 20.0–28.0)
Bicarbonate: 26 mmol/L (ref 20.0–28.0)
Bicarbonate: 26.2 mmol/L (ref 20.0–28.0)
Bicarbonate: 22.6 mmol/L (ref 20.0–28.0)
Bicarbonate: 22.3 mmol/L (ref 20.0–28.0)
Bicarbonate: 20.1 mmol/L (ref 20.0–28.0)
Bicarbonate: 21.9 mmol/L (ref 20.0–28.0)
Calcium, Ion: 1.24 mmol/L (ref 1.15–1.40)
Calcium, Ion: 0.89 mmol/L — CL (ref 1.15–1.40)
Calcium, Ion: 0.98 mmol/L — ABNORMAL LOW (ref 1.15–1.40)
Calcium, Ion: 0.98 mmol/L — ABNORMAL LOW (ref 1.15–1.40)
Calcium, Ion: 0.97 mmol/L — ABNORMAL LOW (ref 1.15–1.40)
Calcium, Ion: 0.99 mmol/L — ABNORMAL LOW (ref 1.15–1.40)
Calcium, Ion: 1.08 mmol/L — ABNORMAL LOW (ref 1.15–1.40)
Calcium, Ion: 1.13 mmol/L — ABNORMAL LOW (ref 1.15–1.40)
Calcium, Ion: 1.13 mmol/L — ABNORMAL LOW (ref 1.15–1.40)
Calcium, Ion: 1.16 mmol/L (ref 1.15–1.40)
HCT: 32 % — ABNORMAL LOW (ref 36.0–46.0)
HCT: 21 % — ABNORMAL LOW (ref 36.0–46.0)
HCT: 24 % — ABNORMAL LOW (ref 36.0–46.0)
HCT: 19 % — ABNORMAL LOW (ref 36.0–46.0)
HCT: 24 % — ABNORMAL LOW (ref 36.0–46.0)
HCT: 25 % — ABNORMAL LOW (ref 36.0–46.0)
HCT: 27 % — ABNORMAL LOW (ref 36.0–46.0)
HCT: 30 % — ABNORMAL LOW (ref 36.0–46.0)
HCT: 29 % — ABNORMAL LOW (ref 36.0–46.0)
HCT: 30 % — ABNORMAL LOW (ref 36.0–46.0)
Hemoglobin: 10.9 g/dL — ABNORMAL LOW (ref 12.0–15.0)
Hemoglobin: 7.1 g/dL — ABNORMAL LOW (ref 12.0–15.0)
Hemoglobin: 8.2 g/dL — ABNORMAL LOW (ref 12.0–15.0)
Hemoglobin: 6.5 g/dL — CL (ref 12.0–15.0)
Hemoglobin: 8.2 g/dL — ABNORMAL LOW (ref 12.0–15.0)
Hemoglobin: 8.5 g/dL — ABNORMAL LOW (ref 12.0–15.0)
Hemoglobin: 9.2 g/dL — ABNORMAL LOW (ref 12.0–15.0)
Hemoglobin: 10.2 g/dL — ABNORMAL LOW (ref 12.0–15.0)
Hemoglobin: 9.9 g/dL — ABNORMAL LOW (ref 12.0–15.0)
Hemoglobin: 10.2 g/dL — ABNORMAL LOW (ref 12.0–15.0)
O2 Saturation: 100 %
O2 Saturation: 100 %
O2 Saturation: 100 %
O2 Saturation: 100 %
O2 Saturation: 100 %
O2 Saturation: 100 %
O2 Saturation: 98 %
O2 Saturation: 99 %
O2 Saturation: 91 %
O2 Saturation: 97 %
Patient temperature: 35.6
Patient temperature: 36.6
Patient temperature: 36.7
Patient temperature: 36.8
Potassium: 4.3 mmol/L (ref 3.5–5.1)
Potassium: 4.2 mmol/L (ref 3.5–5.1)
Potassium: 4.1 mmol/L (ref 3.5–5.1)
Potassium: 4.1 mmol/L (ref 3.5–5.1)
Potassium: 4.5 mmol/L (ref 3.5–5.1)
Potassium: 5.1 mmol/L (ref 3.5–5.1)
Potassium: 3.8 mmol/L (ref 3.5–5.1)
Potassium: 4.8 mmol/L (ref 3.5–5.1)
Potassium: 4.9 mmol/L (ref 3.5–5.1)
Potassium: 5.1 mmol/L (ref 3.5–5.1)
Sodium: 140 mmol/L (ref 135–145)
Sodium: 141 mmol/L (ref 135–145)
Sodium: 139 mmol/L (ref 135–145)
Sodium: 135 mmol/L (ref 135–145)
Sodium: 138 mmol/L (ref 135–145)
Sodium: 139 mmol/L (ref 135–145)
Sodium: 143 mmol/L (ref 135–145)
Sodium: 141 mmol/L (ref 135–145)
Sodium: 140 mmol/L (ref 135–145)
Sodium: 140 mmol/L (ref 135–145)
TCO2: 28 mmol/L (ref 22–32)
TCO2: 29 mmol/L (ref 22–32)
TCO2: 27 mmol/L (ref 22–32)
TCO2: 26 mmol/L (ref 22–32)
TCO2: 27 mmol/L (ref 22–32)
TCO2: 27 mmol/L (ref 22–32)
TCO2: 24 mmol/L (ref 22–32)
TCO2: 24 mmol/L (ref 22–32)
TCO2: 21 mmol/L — ABNORMAL LOW (ref 22–32)
TCO2: 23 mmol/L (ref 22–32)
pCO2 arterial: 44.7 mmHg (ref 32.0–48.0)
pCO2 arterial: 34.6 mmHg (ref 32.0–48.0)
pCO2 arterial: 39.1 mmHg (ref 32.0–48.0)
pCO2 arterial: 42.7 mmHg (ref 32.0–48.0)
pCO2 arterial: 34.1 mmHg (ref 32.0–48.0)
pCO2 arterial: 37.7 mmHg (ref 32.0–48.0)
pCO2 arterial: 39.1 mmHg (ref 32.0–48.0)
pCO2 arterial: 43.2 mmHg (ref 32.0–48.0)
pCO2 arterial: 38.6 mmHg (ref 32.0–48.0)
pCO2 arterial: 44 mmHg (ref 32.0–48.0)
pH, Arterial: 7.377 (ref 7.350–7.450)
pH, Arterial: 7.516 — ABNORMAL HIGH (ref 7.350–7.450)
pH, Arterial: 7.433 (ref 7.350–7.450)
pH, Arterial: 7.379 (ref 7.350–7.450)
pH, Arterial: 7.447 (ref 7.350–7.450)
pH, Arterial: 7.494 — ABNORMAL HIGH (ref 7.350–7.450)
pH, Arterial: 7.363 (ref 7.350–7.450)
pH, Arterial: 7.319 — ABNORMAL LOW (ref 7.350–7.450)
pH, Arterial: 7.322 — ABNORMAL LOW (ref 7.350–7.450)
pH, Arterial: 7.305 — ABNORMAL LOW (ref 7.350–7.450)
pO2, Arterial: 374 mmHg — ABNORMAL HIGH (ref 83.0–108.0)
pO2, Arterial: 375 mmHg — ABNORMAL HIGH (ref 83.0–108.0)
pO2, Arterial: 356 mmHg — ABNORMAL HIGH (ref 83.0–108.0)
pO2, Arterial: 353 mmHg — ABNORMAL HIGH (ref 83.0–108.0)
pO2, Arterial: 373 mmHg — ABNORMAL HIGH (ref 83.0–108.0)
pO2, Arterial: 467 mmHg — ABNORMAL HIGH (ref 83.0–108.0)
pO2, Arterial: 112 mmHg — ABNORMAL HIGH (ref 83.0–108.0)
pO2, Arterial: 136 mmHg — ABNORMAL HIGH (ref 83.0–108.0)
pO2, Arterial: 66 mmHg — ABNORMAL LOW (ref 83.0–108.0)
pO2, Arterial: 96 mmHg (ref 83.0–108.0)

## 2019-12-06 LAB — POCT I-STAT, CHEM 8
BUN: 22 mg/dL (ref 8–23)
BUN: 20 mg/dL (ref 8–23)
BUN: 20 mg/dL (ref 8–23)
BUN: 19 mg/dL (ref 8–23)
BUN: 19 mg/dL (ref 8–23)
Calcium, Ion: 1.26 mmol/L (ref 1.15–1.40)
Calcium, Ion: 1.22 mmol/L (ref 1.15–1.40)
Calcium, Ion: 1 mmol/L — ABNORMAL LOW (ref 1.15–1.40)
Calcium, Ion: 0.95 mmol/L — ABNORMAL LOW (ref 1.15–1.40)
Calcium, Ion: 0.99 mmol/L — ABNORMAL LOW (ref 1.15–1.40)
Chloride: 104 mmol/L (ref 98–111)
Chloride: 103 mmol/L (ref 98–111)
Chloride: 101 mmol/L (ref 98–111)
Chloride: 100 mmol/L (ref 98–111)
Chloride: 103 mmol/L (ref 98–111)
Creatinine, Ser: 0.6 mg/dL (ref 0.44–1.00)
Creatinine, Ser: 0.5 mg/dL (ref 0.44–1.00)
Creatinine, Ser: 0.5 mg/dL (ref 0.44–1.00)
Creatinine, Ser: 0.5 mg/dL (ref 0.44–1.00)
Creatinine, Ser: 0.5 mg/dL (ref 0.44–1.00)
Glucose, Bld: 129 mg/dL — ABNORMAL HIGH (ref 70–99)
Glucose, Bld: 132 mg/dL — ABNORMAL HIGH (ref 70–99)
Glucose, Bld: 124 mg/dL — ABNORMAL HIGH (ref 70–99)
Glucose, Bld: 157 mg/dL — ABNORMAL HIGH (ref 70–99)
Glucose, Bld: 151 mg/dL — ABNORMAL HIGH (ref 70–99)
HCT: 30 % — ABNORMAL LOW (ref 36.0–46.0)
HCT: 26 % — ABNORMAL LOW (ref 36.0–46.0)
HCT: 21 % — ABNORMAL LOW (ref 36.0–46.0)
HCT: 21 % — ABNORMAL LOW (ref 36.0–46.0)
HCT: 24 % — ABNORMAL LOW (ref 36.0–46.0)
Hemoglobin: 10.2 g/dL — ABNORMAL LOW (ref 12.0–15.0)
Hemoglobin: 8.8 g/dL — ABNORMAL LOW (ref 12.0–15.0)
Hemoglobin: 7.1 g/dL — ABNORMAL LOW (ref 12.0–15.0)
Hemoglobin: 7.1 g/dL — ABNORMAL LOW (ref 12.0–15.0)
Hemoglobin: 8.2 g/dL — ABNORMAL LOW (ref 12.0–15.0)
Potassium: 4.3 mmol/L (ref 3.5–5.1)
Potassium: 4.1 mmol/L (ref 3.5–5.1)
Potassium: 4.2 mmol/L (ref 3.5–5.1)
Potassium: 5 mmol/L (ref 3.5–5.1)
Potassium: 4.5 mmol/L (ref 3.5–5.1)
Sodium: 140 mmol/L (ref 135–145)
Sodium: 138 mmol/L (ref 135–145)
Sodium: 138 mmol/L (ref 135–145)
Sodium: 136 mmol/L (ref 135–145)
Sodium: 138 mmol/L (ref 135–145)
TCO2: 26 mmol/L (ref 22–32)
TCO2: 25 mmol/L (ref 22–32)
TCO2: 25 mmol/L (ref 22–32)
TCO2: 25 mmol/L (ref 22–32)
TCO2: 25 mmol/L (ref 22–32)

## 2019-12-06 LAB — ECHO INTRAOPERATIVE TEE
AR max vel: 0.91 cm2
AV Area VTI: 1.12 cm2
AV Area mean vel: 1.01 cm2
AV Mean grad: 21.5 mmHg
AV Peak grad: 31.5 mmHg
Ao pk vel: 2.81 m/s
Area-P 1/2: 3.21 cm2
Height: 64.5 in
P 1/2 time: 338 msec
S' Lateral: 2.44 cm
Weight: 2512 oz

## 2019-12-06 LAB — CBC
HCT: 28.8 % — ABNORMAL LOW (ref 36.0–46.0)
HCT: 31.1 % — ABNORMAL LOW (ref 36.0–46.0)
Hemoglobin: 9.5 g/dL — ABNORMAL LOW (ref 12.0–15.0)
Hemoglobin: 10.3 g/dL — ABNORMAL LOW (ref 12.0–15.0)
MCH: 29.6 pg (ref 26.0–34.0)
MCH: 29.9 pg (ref 26.0–34.0)
MCHC: 33 g/dL (ref 30.0–36.0)
MCHC: 33.1 g/dL (ref 30.0–36.0)
MCV: 89.7 fL (ref 80.0–100.0)
MCV: 90.4 fL (ref 80.0–100.0)
Platelets: 95 10*3/uL — ABNORMAL LOW (ref 150–400)
Platelets: 108 10*3/uL — ABNORMAL LOW (ref 150–400)
RBC: 3.21 MIL/uL — ABNORMAL LOW (ref 3.87–5.11)
RBC: 3.44 MIL/uL — ABNORMAL LOW (ref 3.87–5.11)
RDW: 13 % (ref 11.5–15.5)
RDW: 13.2 % (ref 11.5–15.5)
WBC: 7.6 10*3/uL (ref 4.0–10.5)
WBC: 9.7 10*3/uL (ref 4.0–10.5)
nRBC: 0 % (ref 0.0–0.2)
nRBC: 0 % (ref 0.0–0.2)

## 2019-12-06 LAB — BASIC METABOLIC PANEL
Anion gap: 9 (ref 5–15)
BUN: 19 mg/dL (ref 8–23)
CO2: 21 mmol/L — ABNORMAL LOW (ref 22–32)
Calcium: 7.9 mg/dL — ABNORMAL LOW (ref 8.9–10.3)
Chloride: 108 mmol/L (ref 98–111)
Creatinine, Ser: 0.76 mg/dL (ref 0.44–1.00)
GFR calc Af Amer: >60 mL/min (ref >60)
GFR calc non Af Amer: >60 mL/min (ref >60)
Glucose, Bld: 193 mg/dL — ABNORMAL HIGH (ref 70–99)
Potassium: 5.1 mmol/L (ref 3.5–5.1)
Sodium: 138 mmol/L (ref 135–145)

## 2019-12-06 LAB — PROTIME-INR
INR: 1.6 — ABNORMAL HIGH (ref 0.8–1.2)
Prothrombin Time: 18.2 seconds — ABNORMAL HIGH (ref 11.4–15.2)

## 2019-12-06 LAB — ABO/RH: ABO/RH(D): O POS

## 2019-12-06 LAB — HEMOGLOBIN AND HEMATOCRIT, BLOOD
HCT: 23.7 % — ABNORMAL LOW (ref 36.0–46.0)
Hemoglobin: 7.8 g/dL — ABNORMAL LOW (ref 12.0–15.0)

## 2019-12-06 LAB — APTT: aPTT: 41 seconds — ABNORMAL HIGH (ref 24–36)

## 2019-12-06 LAB — FIBRINOGEN: Fibrinogen: 161 mg/dL — ABNORMAL LOW (ref 210–475)

## 2019-12-06 LAB — PLATELET COUNT: Platelets: 133 10*3/uL — ABNORMAL LOW (ref 150–400)

## 2019-12-06 LAB — GLUCOSE, CAPILLARY
Glucose-Capillary: 113 mg/dL — ABNORMAL HIGH (ref 70–99)
Glucose-Capillary: 159 mg/dL — ABNORMAL HIGH (ref 70–99)
Glucose-Capillary: 172 mg/dL — ABNORMAL HIGH (ref 70–99)
Glucose-Capillary: 75 mg/dL (ref 70–99)
Glucose-Capillary: 85 mg/dL (ref 70–99)
Glucose-Capillary: 99 mg/dL (ref 70–99)

## 2019-12-06 LAB — MAGNESIUM: Magnesium: 3 mg/dL — ABNORMAL HIGH (ref 1.7–2.4)

## 2019-12-06 LAB — PREPARE RBC (CROSSMATCH)

## 2019-12-06 SURGERY — REPLACEMENT, AORTIC VALVE, OPEN
Anesthesia: General | Site: Esophagus

## 2019-12-06 MED ORDER — PROTAMINE SULFATE 10 MG/ML IV SOLN
INTRAVENOUS | Status: AC
  Filled 2019-12-06: qty 25

## 2019-12-06 MED ORDER — CHLORHEXIDINE GLUCONATE CLOTH 2 % EX PADS
6.0000 | MEDICATED_PAD | Freq: Every day | CUTANEOUS | Status: DC
  Administered 2019-12-06 – 2019-12-10 (×3): 6 via TOPICAL

## 2019-12-06 MED ORDER — ETOMIDATE 2 MG/ML IV SOLN
INTRAVENOUS | Status: DC | PRN
  Administered 2019-12-06: 12 mg via INTRAVENOUS

## 2019-12-06 MED ORDER — DEXAMETHASONE SODIUM PHOSPHATE 10 MG/ML IJ SOLN
INTRAMUSCULAR | Status: DC | PRN
  Administered 2019-12-06: 10 mg via INTRAVENOUS

## 2019-12-06 MED ORDER — LEVOTHYROXINE SODIUM 112 MCG PO TABS
112.0000 ug | ORAL_TABLET | Freq: Every day | ORAL | Status: DC
  Administered 2019-12-07 – 2019-12-10 (×4): 112 ug via ORAL
  Filled 2019-12-06 (×4): qty 1

## 2019-12-06 MED ORDER — SODIUM CHLORIDE 0.9% FLUSH: 3.0000 mL | INTRAVENOUS | Status: DC | PRN

## 2019-12-06 MED ORDER — PROTAMINE SULFATE 10 MG/ML IV SOLN
INTRAVENOUS | Status: DC | PRN
  Administered 2019-12-06: 250 mg via INTRAVENOUS

## 2019-12-06 MED ORDER — THROMBIN (RECOMBINANT) 20000 UNITS EX SOLR
CUTANEOUS | Status: AC
  Filled 2019-12-06: qty 20000

## 2019-12-06 MED ORDER — SODIUM CHLORIDE 0.9% FLUSH
10.0000 mL | Freq: Two times a day (BID) | INTRAVENOUS | Status: DC
  Administered 2019-12-06 – 2019-12-09 (×5): 10 mL

## 2019-12-06 MED ORDER — ALBUMIN HUMAN 5 % IV SOLN: INTRAVENOUS | Status: DC | PRN

## 2019-12-06 MED ORDER — DEXAMETHASONE SODIUM PHOSPHATE 10 MG/ML IJ SOLN
INTRAMUSCULAR | Status: AC
  Filled 2019-12-06: qty 1

## 2019-12-06 MED ORDER — BISACODYL 5 MG PO TBEC
10.0000 mg | DELAYED_RELEASE_TABLET | Freq: Every day | ORAL | Status: DC
  Administered 2019-12-07 – 2019-12-08 (×2): 10 mg via ORAL
  Filled 2019-12-06 (×2): qty 2

## 2019-12-06 MED ORDER — SODIUM CHLORIDE 0.9% FLUSH
3.0000 mL | Freq: Two times a day (BID) | INTRAVENOUS | Status: DC
  Administered 2019-12-07 – 2019-12-09 (×2): 3 mL via INTRAVENOUS

## 2019-12-06 MED ORDER — ONDANSETRON HCL 4 MG/2ML IJ SOLN
INTRAMUSCULAR | Status: AC
  Filled 2019-12-06: qty 2

## 2019-12-06 MED ORDER — TRAMADOL HCL 50 MG PO TABS
50.0000 mg | ORAL_TABLET | ORAL | Status: DC | PRN
  Administered 2019-12-06 – 2019-12-08 (×2): 100 mg via ORAL
  Filled 2019-12-06 (×2): qty 2

## 2019-12-06 MED ORDER — MAGNESIUM SULFATE 4 GM/100ML IV SOLN
4.0000 g | Freq: Once | INTRAVENOUS | Status: AC
  Administered 2019-12-06: 4 g via INTRAVENOUS
  Filled 2019-12-06: qty 100

## 2019-12-06 MED ORDER — ACETAMINOPHEN 160 MG/5ML PO SOLN: 1000.0000 mg | Freq: Four times a day (QID) | ORAL | Status: DC

## 2019-12-06 MED ORDER — ASPIRIN EC 325 MG PO TBEC
325.0000 mg | DELAYED_RELEASE_TABLET | Freq: Every day | ORAL | Status: DC
  Administered 2019-12-07 – 2019-12-09 (×3): 325 mg via ORAL
  Filled 2019-12-06 (×3): qty 1

## 2019-12-06 MED ORDER — METOPROLOL TARTRATE 12.5 MG HALF TABLET
12.5000 mg | ORAL_TABLET | Freq: Two times a day (BID) | ORAL | Status: DC
  Administered 2019-12-06 – 2019-12-08 (×5): 12.5 mg via ORAL
  Filled 2019-12-06 (×5): qty 1

## 2019-12-06 MED ORDER — ORAL CARE MOUTH RINSE: 15.0000 mL | Freq: Once | OROMUCOSAL | Status: AC

## 2019-12-06 MED ORDER — LACTATED RINGERS IV SOLN: INTRAVENOUS | Status: DC | PRN

## 2019-12-06 MED ORDER — ORAL CARE MOUTH RINSE
15.0000 mL | Freq: Two times a day (BID) | OROMUCOSAL | Status: DC
  Administered 2019-12-06 – 2019-12-09 (×5): 15 mL via OROMUCOSAL

## 2019-12-06 MED ORDER — SODIUM CHLORIDE 0.9% FLUSH: 10.0000 mL | INTRAVENOUS | Status: DC | PRN

## 2019-12-06 MED ORDER — MIDAZOLAM HCL (PF) 10 MG/2ML IJ SOLN
INTRAMUSCULAR | Status: AC
  Filled 2019-12-06: qty 2

## 2019-12-06 MED ORDER — ROCURONIUM BROMIDE 10 MG/ML (PF) SYRINGE
PREFILLED_SYRINGE | INTRAVENOUS | Status: AC
  Filled 2019-12-06: qty 10

## 2019-12-06 MED ORDER — LACTATED RINGERS IV SOLN: INTRAVENOUS | Status: DC

## 2019-12-06 MED ORDER — ACETAMINOPHEN 160 MG/5ML PO SOLN: 650.0000 mg | Freq: Once | ORAL | Status: AC

## 2019-12-06 MED ORDER — HEPARIN SODIUM (PORCINE) 1000 UNIT/ML IJ SOLN
INTRAMUSCULAR | Status: DC | PRN
  Administered 2019-12-06: 25000 [IU] via INTRAVENOUS

## 2019-12-06 MED ORDER — METOPROLOL TARTRATE 5 MG/5ML IV SOLN: 2.5000 mg | INTRAVENOUS | Status: DC | PRN

## 2019-12-06 MED ORDER — MIDAZOLAM HCL 2 MG/2ML IJ SOLN: 2.0000 mg | INTRAMUSCULAR | Status: DC | PRN

## 2019-12-06 MED ORDER — ACETAMINOPHEN 650 MG RE SUPP
650.0000 mg | Freq: Once | RECTAL | Status: AC
  Administered 2019-12-06: 650 mg via RECTAL

## 2019-12-06 MED ORDER — SODIUM CHLORIDE 0.9% IV SOLUTION: Freq: Once | INTRAVENOUS | Status: AC

## 2019-12-06 MED ORDER — 0.9 % SODIUM CHLORIDE (POUR BTL) OPTIME
TOPICAL | Status: DC | PRN
  Administered 2019-12-06: 6000 mL

## 2019-12-06 MED ORDER — BISACODYL 10 MG RE SUPP: 10.0000 mg | Freq: Every day | RECTAL | Status: DC

## 2019-12-06 MED ORDER — PANTOPRAZOLE SODIUM 40 MG PO TBEC
40.0000 mg | DELAYED_RELEASE_TABLET | Freq: Every day | ORAL | Status: DC
  Administered 2019-12-08: 40 mg via ORAL
  Filled 2019-12-06: qty 1

## 2019-12-06 MED ORDER — CHLORHEXIDINE GLUCONATE 4 % EX LIQD: 30.0000 mL | CUTANEOUS | Status: DC

## 2019-12-06 MED ORDER — DEXMEDETOMIDINE HCL IN NACL 400 MCG/100ML IV SOLN: 0.0000 ug/kg/h | INTRAVENOUS | Status: DC

## 2019-12-06 MED ORDER — MORPHINE SULFATE (PF) 2 MG/ML IV SOLN: 1.0000 mg | INTRAVENOUS | Status: DC | PRN

## 2019-12-06 MED ORDER — METHYLPREDNISOLONE SODIUM SUCC 125 MG IJ SOLR
INTRAMUSCULAR | Status: DC | PRN
  Administered 2019-12-06: 125 mg via INTRAVENOUS

## 2019-12-06 MED ORDER — DOCUSATE SODIUM 100 MG PO CAPS
200.0000 mg | ORAL_CAPSULE | Freq: Every day | ORAL | Status: DC
  Administered 2019-12-07 – 2019-12-09 (×3): 200 mg via ORAL
  Filled 2019-12-06 (×3): qty 2

## 2019-12-06 MED ORDER — POTASSIUM CHLORIDE 10 MEQ/50ML IV SOLN
10.0000 meq | INTRAVENOUS | Status: AC
  Administered 2019-12-06 (×3): 10 meq via INTRAVENOUS

## 2019-12-06 MED ORDER — ONDANSETRON HCL 4 MG/2ML IJ SOLN
INTRAMUSCULAR | Status: DC | PRN
  Administered 2019-12-06: 4 mg via INTRAVENOUS

## 2019-12-06 MED ORDER — ASPIRIN 81 MG PO CHEW
324.0000 mg | CHEWABLE_TABLET | Freq: Every day | ORAL | Status: DC
  Administered 2019-12-10: 324 mg
  Filled 2019-12-06: qty 4

## 2019-12-06 MED ORDER — PROPOFOL 10 MG/ML IV BOLUS
INTRAVENOUS | Status: AC
  Filled 2019-12-06: qty 20

## 2019-12-06 MED ORDER — FENTANYL CITRATE (PF) 250 MCG/5ML IJ SOLN
INTRAMUSCULAR | Status: AC
  Filled 2019-12-06: qty 25

## 2019-12-06 MED ORDER — CHLORHEXIDINE GLUCONATE 0.12 % MT SOLN: 15.0000 mL | Freq: Once | OROMUCOSAL | Status: DC

## 2019-12-06 MED ORDER — LACTATED RINGERS IV SOLN: 500.0000 mL | Freq: Once | INTRAVENOUS | Status: DC | PRN

## 2019-12-06 MED ORDER — PHENYLEPHRINE 40 MCG/ML (10ML) SYRINGE FOR IV PUSH (FOR BLOOD PRESSURE SUPPORT)
PREFILLED_SYRINGE | INTRAVENOUS | Status: AC
  Filled 2019-12-06: qty 10

## 2019-12-06 MED ORDER — SODIUM CHLORIDE 0.9 % IV SOLN: INTRAVENOUS | Status: DC

## 2019-12-06 MED ORDER — FAMOTIDINE IN NACL 20-0.9 MG/50ML-% IV SOLN
20.0000 mg | Freq: Two times a day (BID) | INTRAVENOUS | Status: AC
  Administered 2019-12-06 (×2): 20 mg via INTRAVENOUS
  Filled 2019-12-06: qty 50

## 2019-12-06 MED ORDER — SODIUM CHLORIDE 0.45 % IV SOLN: INTRAVENOUS | Status: DC | PRN

## 2019-12-06 MED ORDER — CHLORHEXIDINE GLUCONATE 0.12 % MT SOLN
15.0000 mL | OROMUCOSAL | Status: AC
  Administered 2019-12-06: 15 mL via OROMUCOSAL

## 2019-12-06 MED ORDER — ALBUMIN HUMAN 5 % IV SOLN: 250.0000 mL | INTRAVENOUS | Status: AC | PRN

## 2019-12-06 MED ORDER — INSULIN REGULAR(HUMAN) IN NACL 100-0.9 UT/100ML-% IV SOLN: INTRAVENOUS | Status: DC

## 2019-12-06 MED ORDER — OXYCODONE HCL 5 MG PO TABS
5.0000 mg | ORAL_TABLET | ORAL | Status: DC | PRN
  Administered 2019-12-06: 10 mg via ORAL
  Filled 2019-12-06: qty 2

## 2019-12-06 MED ORDER — MIDAZOLAM HCL 5 MG/5ML IJ SOLN
INTRAMUSCULAR | Status: DC | PRN
  Administered 2019-12-06 (×2): 1 mg via INTRAVENOUS
  Administered 2019-12-06: 2 mg via INTRAVENOUS
  Administered 2019-12-06 (×3): 1 mg via INTRAVENOUS

## 2019-12-06 MED ORDER — SODIUM CHLORIDE (PF) 0.9 % IJ SOLN
OROMUCOSAL | Status: DC | PRN
  Administered 2019-12-06 (×3): 4 mL via TOPICAL

## 2019-12-06 MED ORDER — HEMOSTATIC AGENTS (NO CHARGE) OPTIME
TOPICAL | Status: DC | PRN
  Administered 2019-12-06 (×2): 1 via TOPICAL

## 2019-12-06 MED ORDER — NITROGLYCERIN IN D5W 200-5 MCG/ML-% IV SOLN: 0.0000 ug/min | INTRAVENOUS | Status: DC

## 2019-12-06 MED ORDER — DEXTROSE 50 % IV SOLN: 0.0000 mL | INTRAVENOUS | Status: DC | PRN

## 2019-12-06 MED ORDER — INSULIN ASPART 100 UNIT/ML ~~LOC~~ SOLN
0.0000 [IU] | SUBCUTANEOUS | Status: DC
  Administered 2019-12-06: 4 [IU] via SUBCUTANEOUS
  Administered 2019-12-06 – 2019-12-07 (×2): 2 [IU] via SUBCUTANEOUS

## 2019-12-06 MED ORDER — PHENYLEPHRINE 40 MCG/ML (10ML) SYRINGE FOR IV PUSH (FOR BLOOD PRESSURE SUPPORT)
PREFILLED_SYRINGE | INTRAVENOUS | Status: DC | PRN
  Administered 2019-12-06: 40 ug via INTRAVENOUS
  Administered 2019-12-06 (×2): 80 ug via INTRAVENOUS

## 2019-12-06 MED ORDER — ETOMIDATE 2 MG/ML IV SOLN
INTRAVENOUS | Status: AC
  Filled 2019-12-06: qty 10

## 2019-12-06 MED ORDER — ONDANSETRON HCL 4 MG/2ML IJ SOLN
4.0000 mg | Freq: Four times a day (QID) | INTRAMUSCULAR | Status: DC | PRN
  Administered 2019-12-06 – 2019-12-07 (×2): 4 mg via INTRAVENOUS
  Filled 2019-12-06 (×2): qty 2

## 2019-12-06 MED ORDER — ORAL CARE MOUTH RINSE
15.0000 mL | OROMUCOSAL | Status: DC
  Administered 2019-12-06: 15 mL via OROMUCOSAL

## 2019-12-06 MED ORDER — PROTAMINE SULFATE 10 MG/ML IV SOLN
INTRAVENOUS | Status: AC
  Filled 2019-12-06: qty 5

## 2019-12-06 MED ORDER — SODIUM CHLORIDE 0.9 % IV SOLN: 250.0000 mL | INTRAVENOUS | Status: DC

## 2019-12-06 MED ORDER — HEPARIN SODIUM (PORCINE) 1000 UNIT/ML IJ SOLN
INTRAMUSCULAR | Status: AC
  Filled 2019-12-06: qty 1

## 2019-12-06 MED ORDER — PROPOFOL 10 MG/ML IV BOLUS
INTRAVENOUS | Status: DC | PRN
  Administered 2019-12-06: 160 mg via INTRAVENOUS

## 2019-12-06 MED ORDER — SODIUM CHLORIDE 0.9 % IV SOLN
1.5000 g | Freq: Two times a day (BID) | INTRAVENOUS | Status: AC
  Administered 2019-12-06 – 2019-12-08 (×4): 1.5 g via INTRAVENOUS
  Filled 2019-12-06 (×3): qty 1.5

## 2019-12-06 MED ORDER — CHLORHEXIDINE GLUCONATE 0.12% ORAL RINSE (MEDLINE KIT): 15.0000 mL | Freq: Two times a day (BID) | OROMUCOSAL | Status: DC

## 2019-12-06 MED ORDER — FENTANYL CITRATE (PF) 250 MCG/5ML IJ SOLN
INTRAMUSCULAR | Status: DC | PRN
Start: 2019-12-06 — End: 2019-12-06
  Administered 2019-12-06: 50 ug via INTRAVENOUS
  Administered 2019-12-06: 100 ug via INTRAVENOUS
  Administered 2019-12-06: 150 ug via INTRAVENOUS
  Administered 2019-12-06 (×3): 100 ug via INTRAVENOUS
  Administered 2019-12-06 (×3): 50 ug via INTRAVENOUS
  Administered 2019-12-06: 150 ug via INTRAVENOUS
  Administered 2019-12-06: 100 ug via INTRAVENOUS

## 2019-12-06 MED ORDER — METOPROLOL TARTRATE 12.5 MG HALF TABLET
12.5000 mg | ORAL_TABLET | Freq: Once | ORAL | Status: DC
  Filled 2019-12-06: qty 1

## 2019-12-06 MED ORDER — PHENYLEPHRINE HCL-NACL 20-0.9 MG/250ML-% IV SOLN: 0.0000 ug/min | INTRAVENOUS | Status: DC

## 2019-12-06 MED ORDER — ACETAMINOPHEN 500 MG PO TABS
1000.0000 mg | ORAL_TABLET | Freq: Four times a day (QID) | ORAL | Status: DC
  Administered 2019-12-06 – 2019-12-10 (×11): 1000 mg via ORAL
  Filled 2019-12-06 (×12): qty 2

## 2019-12-06 MED ORDER — THROMBIN 20000 UNITS EX SOLR
CUTANEOUS | Status: DC | PRN
  Administered 2019-12-06: 20000 [IU] via TOPICAL

## 2019-12-06 MED ORDER — VANCOMYCIN HCL IN DEXTROSE 1-5 GM/200ML-% IV SOLN
1000.0000 mg | Freq: Once | INTRAVENOUS | Status: AC
  Administered 2019-12-06: 1000 mg via INTRAVENOUS
  Filled 2019-12-06: qty 200

## 2019-12-06 MED ORDER — ROCURONIUM BROMIDE 10 MG/ML (PF) SYRINGE
PREFILLED_SYRINGE | INTRAVENOUS | Status: DC | PRN
  Administered 2019-12-06: 20 mg via INTRAVENOUS
  Administered 2019-12-06: 60 mg via INTRAVENOUS
  Administered 2019-12-06: 40 mg via INTRAVENOUS
  Administered 2019-12-06: 50 mg via INTRAVENOUS

## 2019-12-06 MED ORDER — METOPROLOL TARTRATE 25 MG/10 ML ORAL SUSPENSION
12.5000 mg | Freq: Two times a day (BID) | ORAL | Status: DC
  Filled 2019-12-06 (×2): qty 5

## 2019-12-06 MED ORDER — SODIUM CHLORIDE 0.9 % IV SOLN: INTRAVENOUS | Status: DC | PRN

## 2019-12-06 MED ORDER — CHLORHEXIDINE GLUCONATE 0.12 % MT SOLN
15.0000 mL | Freq: Once | OROMUCOSAL | Status: AC
  Administered 2019-12-06: 15 mL via OROMUCOSAL
  Filled 2019-12-06: qty 15

## 2019-12-06 SURGICAL SUPPLY — 86 items
ADAPTER CARDIO PERF ANTE/RETRO (ADAPTER) ×3 IMPLANT
ADAPTER DLP PERFUSION .25INX2I (MISCELLANEOUS) ×3 IMPLANT
APPLICATOR TIP COSEAL (VASCULAR PRODUCTS) ×3 IMPLANT
BAG DECANTER FOR FLEXI CONT (MISCELLANEOUS) ×3 IMPLANT
BLADE CLIPPER SURG (BLADE) ×3 IMPLANT
BLADE STERNUM SYSTEM 6 (BLADE) ×3 IMPLANT
BLADE SURG 15 STRL LF DISP TIS (BLADE) ×2 IMPLANT
BLADE SURG 15 STRL SS (BLADE) ×1
CANISTER SUCT 3000ML PPV (MISCELLANEOUS) ×3 IMPLANT
CANNULA AORTIC ROOT 9FR (CANNULA) ×3 IMPLANT
CANNULA ARTERIAL VENT 3/8 20FR (CANNULA) ×3 IMPLANT
CANNULA GUNDRY RCSP 15FR (MISCELLANEOUS) ×6 IMPLANT
CANNULA MC2 2 STG 36/46 NON-V (CANNULA) ×2 IMPLANT
CANNULA SUMP PERICARDIAL (CANNULA) ×3 IMPLANT
CANNULA VENOUS 2 STG 34/46 (CANNULA) ×1
CATH HEART VENT LEFT (CATHETERS) ×2 IMPLANT
CATH ROBINSON RED A/P 18FR (CATHETERS) ×12 IMPLANT
CATH THORACIC 36FR (CATHETERS) ×3 IMPLANT
CATH THORACIC 36FR RT ANG (CATHETERS) ×3 IMPLANT
CAUTERY SURG HI TEMP FINE TIP (MISCELLANEOUS) ×3 IMPLANT
CNTNR URN SCR LID CUP LEK RST (MISCELLANEOUS) ×2 IMPLANT
CONT SPEC 4OZ STRL OR WHT (MISCELLANEOUS) ×1
COVER SURGICAL LIGHT HANDLE (MISCELLANEOUS) ×3 IMPLANT
DRAPE CARDIOVASCULAR INCISE (DRAPES) ×1
DRAPE SLUSH/WARMER DISC (DRAPES) ×3 IMPLANT
DRAPE SRG 135X102X78XABS (DRAPES) ×2 IMPLANT
DRSG COVADERM 4X14 (GAUZE/BANDAGES/DRESSINGS) ×3 IMPLANT
ELECT CAUTERY BLADE 6.4 (BLADE) ×3 IMPLANT
ELECT REM PT RETURN 9FT ADLT (ELECTROSURGICAL) ×6
ELECTRODE REM PT RTRN 9FT ADLT (ELECTROSURGICAL) ×4 IMPLANT
FELT TEFLON 1X6 (MISCELLANEOUS) ×6 IMPLANT
GAUZE SPONGE 4X4 12PLY STRL (GAUZE/BANDAGES/DRESSINGS) ×3 IMPLANT
GLOVE BIO SURGEON STRL SZ 6 (GLOVE) ×12 IMPLANT
GLOVE BIO SURGEON STRL SZ 6.5 (GLOVE) ×12 IMPLANT
GLOVE BIO SURGEON STRL SZ7 (GLOVE) IMPLANT
GLOVE BIO SURGEON STRL SZ7.5 (GLOVE) IMPLANT
GLOVE TRIUMPH SURG SIZE 7.0 (KITS) ×6 IMPLANT
GOWN STRL REUS W/ TWL LRG LVL3 (GOWN DISPOSABLE) ×12 IMPLANT
GOWN STRL REUS W/ TWL XL LVL3 (GOWN DISPOSABLE) ×2 IMPLANT
GOWN STRL REUS W/TWL LRG LVL3 (GOWN DISPOSABLE) ×6
GOWN STRL REUS W/TWL XL LVL3 (GOWN DISPOSABLE) ×1
GRAFT HEMASHIELD 30X10 (Vascular Products) ×3 IMPLANT
HEMOSTAT POWDER SURGIFOAM 1G (HEMOSTASIS) ×9 IMPLANT
HEMOSTAT SURGICEL 2X14 (HEMOSTASIS) ×3 IMPLANT
KIT BASIN OR (CUSTOM PROCEDURE TRAY) ×3 IMPLANT
KIT CATH CPB BARTLE (MISCELLANEOUS) ×3 IMPLANT
KIT SUCTION CATH 14FR (SUCTIONS) ×3 IMPLANT
KIT TURNOVER KIT B (KITS) ×3 IMPLANT
LINE VENT (MISCELLANEOUS) ×3 IMPLANT
LOOP VESSEL SUPERMAXI WHITE (MISCELLANEOUS) ×3 IMPLANT
NS IRRIG 1000ML POUR BTL (IV SOLUTION) ×18 IMPLANT
PACK E OPEN HEART (SUTURE) ×3 IMPLANT
PACK OPEN HEART (CUSTOM PROCEDURE TRAY) ×3 IMPLANT
PAD ARMBOARD 7.5X6 YLW CONV (MISCELLANEOUS) ×6 IMPLANT
POSITIONER HEAD DONUT 9IN (MISCELLANEOUS) ×3 IMPLANT
SEALANT SURG COSEAL 8ML (VASCULAR PRODUCTS) ×3 IMPLANT
SET CARDIOPLEGIA MPS 5001102 (MISCELLANEOUS) ×3 IMPLANT
SUT BONE WAX W31G (SUTURE) ×3 IMPLANT
SUT ETHIBON 2 0 V 52N 30 (SUTURE) ×6 IMPLANT
SUT ETHIBON EXCEL 2-0 V-5 (SUTURE) ×3 IMPLANT
SUT ETHIBOND 2 0 SH (SUTURE) ×1
SUT ETHIBOND 2 0 SH 36X2 (SUTURE) ×2 IMPLANT
SUT ETHIBOND V-5 VALVE (SUTURE) ×12 IMPLANT
SUT PROLENE 3 0 SH 48 (SUTURE) ×3 IMPLANT
SUT PROLENE 3 0 SH DA (SUTURE) ×6 IMPLANT
SUT PROLENE 3 0 SH1 36 (SUTURE) ×3 IMPLANT
SUT PROLENE 4 0 RB 1 (SUTURE) ×7
SUT PROLENE 4-0 RB1 .5 CRCL 36 (SUTURE) ×14 IMPLANT
SUT SILK 2 0 SH CR/8 (SUTURE) ×3 IMPLANT
SUT STEEL 6MS V (SUTURE) ×6 IMPLANT
SUT VIC AB 1 CTX 36 (SUTURE) ×3
SUT VIC AB 1 CTX36XBRD ANBCTR (SUTURE) ×6 IMPLANT
SYR BULB IRRIG 60ML STRL (SYRINGE) ×6 IMPLANT
SYSTEM SAHARA CHEST DRAIN ATS (WOUND CARE) ×3 IMPLANT
TAPE CLOTH SURG 4X10 WHT LF (GAUZE/BANDAGES/DRESSINGS) ×3 IMPLANT
TOWEL GREEN STERILE (TOWEL DISPOSABLE) ×3 IMPLANT
TOWEL GREEN STERILE FF (TOWEL DISPOSABLE) ×3 IMPLANT
TRAY FOLEY SLVR 16FR TEMP STAT (SET/KITS/TRAYS/PACK) ×3 IMPLANT
TUBE CONNECTING 12X1/4 (SUCTIONS) ×3 IMPLANT
TUBE SUCT INTRACARD DLP 20F (MISCELLANEOUS) ×3 IMPLANT
TUBING PVC 1/4X1/16 WALL 8 (MISCELLANEOUS) ×3 IMPLANT
UNDERPAD 30X36 HEAVY ABSORB (UNDERPADS AND DIAPERS) ×3 IMPLANT
VALVE AORTIC SZ23 INSP/RESIL (Prosthesis & Implant Heart) ×3 IMPLANT
VENT LEFT HEART 12002 (CATHETERS) ×3
WATER STERILE IRR 1000ML POUR (IV SOLUTION) ×6 IMPLANT
YANKAUER SUCT BULB TIP NO VENT (SUCTIONS) ×3 IMPLANT

## 2019-12-06 NOTE — Plan of Care (Signed)
Patient arrived to unit at 1300. Extubated at 1641 to [place]. Patient O x 4, moving all extremities equally. Was able to sit and dangle post extubation. Stood up and took a few steps before being helped back into bed. No c/o pain. Hemodynamically stable, off all drips. CO/CI WDL. Bathed, skin WDL. Husband at the bedside, appropriate and supportive.   Problem: Activity: Goal: Risk for activity intolerance will decrease Outcome: Progressing   Problem: Cardiac: Goal: Will achieve and/or maintain hemodynamic stability Outcome: Progressing   Problem: Activity: Goal: Ability to tolerate increased activity will improve Outcome: Completed/Met   Problem: Respiratory: Goal: Ability to maintain a clear airway and adequate ventilation will improve Outcome: Completed/Met   Problem: Role Relationship: Goal: Method of communication will improve Outcome: Completed/Met

## 2019-12-06 NOTE — Progress Notes (Signed)
EVENING ROUNDS NOTE :     [address]       [place] [address]             [phone]                 Day of Surgery Procedure(s) (LRB): AORTIC VALVE REPLACEMENT (AVR) (N/A) REPLACEMENT OF ASCENDING AORTIC ANEURYSM (N/A) TRANSESOPHAGEAL ECHOCARDIOGRAM (TEE) (N/A)   Total Length of Stay:  LOS: 0 days  Events:   Extubated Sinus On nitro Doing well    BP 103/67   Pulse 80   Temp 98.1 F (36.7 C)   Resp 17   Ht 5\' 4"  (1.626 m)   Wt 71.2 kg   SpO2 98%   BMI 26.95 kg/m   PAP: (24-35)/(12-18) 26/13 CO:  [3.5 L/min-5.1 L/min] 4.6 L/min CI:  [2 L/min/m2-2.9 L/min/m2] 2.6 L/min/m2  Vent Mode: CPAP;PSV FiO2 (%):  [40 %-50 %] 40 % Set Rate:  [4 bmp-12 bmp] 4 bmp Vt Set:  [430 mL] 430 mL PEEP:  [5 cmH20] 5 cmH20 Pressure Support:  [10 cmH20] 10 cmH20 Plateau Pressure:  [12 cmH20-14 cmH20] 14 cmH20  . sodium chloride Stopped (12/06/19 1603)  . [START ON 12/07/2019] sodium chloride    . sodium chloride 10 mL/hr at 12/06/19 1327  . albumin human    . cefUROXime (ZINACEF)  IV    . dexmedetomidine (PRECEDEX) IV infusion Stopped (12/06/19 1458)  . famotidine (PEPCID) IV Stopped (12/06/19 1337)  . lactated ringers    . lactated ringers    . lactated ringers 20 mL/hr at 12/06/19 1700  . magnesium sulfate 20 mL/hr at 12/06/19 1700  . nitroGLYCERIN 15 mcg/min (12/06/19 1700)  . phenylephrine (NEO-SYNEPHRINE) Adult infusion Stopped (12/06/19 1453)  . vancomycin      No intake/output data recorded.   CBC Latest Ref Rng & Units 12/06/2019 12/06/2019 12/06/2019  WBC 4.0 - 10.5 K/uL - 7.6 -  Hemoglobin 12.0 - 15.0 g/dL 9.2(L) 9.5(L) 8.5(L)  Hematocrit 36 - 46 % 27.0(L) 28.8(L) 25.0(L)  Platelets 150 - 400 K/uL - 95(L) -    BMP Latest Ref Rng & Units 12/06/2019 12/06/2019 12/06/2019  Glucose 70 - 99 mg/dL - - 151(H)  BUN 8 - 23 mg/dL - - 19  Creatinine 0.44 - 1.00 mg/dL - - 0.50  BUN/Creat Ratio 12 - 28 - - -  Sodium 135 - 145 mmol/L 143 139 138    Potassium 3.5 - 5.1 mmol/L 3.8 4.5 4.5  Chloride 98 - 111 mmol/L - - 103  CO2 22 - 32 mmol/L - - -  Calcium 8.9 - 10.3 mg/dL - - -    ABG    Component Value Date/Time   PHART 7.363 12/06/2019 1315   PCO2ART 39.1 12/06/2019 1315   PO2ART 112 (H) 12/06/2019 1315   HCO3 22.6 12/06/2019 1315   TCO2 24 12/06/2019 1315   ACIDBASEDEF 3.0 (H) 12/06/2019 1315   O2SAT 98.0 12/06/2019 [place], MD 12/06/2019 5:18 PM

## 2019-12-06 NOTE — Transfer of Care (Signed)
Immediate Anesthesia Transfer of Care Note  Patient: Joann Barry  Procedure(s) Performed: AORTIC VALVE REPLACEMENT (AVR) (N/A Chest) REPLACEMENT OF ASCENDING AORTIC ANEURYSM (N/A Chest) TRANSESOPHAGEAL ECHOCARDIOGRAM (TEE) (N/A Esophagus)  Patient Location: ICU  Anesthesia Type:General  Level of Consciousness: sedated and Patient remains intubated per anesthesia plan  Airway & Oxygen Therapy: Patient remains intubated per anesthesia plan and Patient placed on Ventilator (see vital sign flow sheet for setting)  Post-op Assessment: Report given to RN and Post -op Vital signs reviewed and stable  Post vital signs: Reviewed and stable  Last Vitals:  Vitals Value Taken Time  BP    Temp    Pulse    Resp 12 12/06/19 1304  SpO2 98 % 12/06/19 1304    Last Pain:  Vitals:   12/06/19 0620  TempSrc:   PainSc: 0-No pain      Patients Stated Pain Goal: 4 (09/73/53 2992)  Complications: No complications documented.

## 2019-12-06 NOTE — Procedures (Signed)
Extubation Procedure Note  Patient Details:   Name: Joann Barry DOB: 1945/12/05 MRN: [id number]   Airway Documentation:    Vent end date: 12/06/19 Vent end time: 1640   Evaluation  O2 sats: stable throughout Complications: No apparent complications Patient did tolerate procedure well. Bilateral Breath Sounds: Clear, Diminished   Yes   Positive cuff leak noted. NIF -38, VC 1.7L. Patient placed on [place] 4L with humidity, no stridor noted, patient able to reach 1051mL using the IS.  Bayard Beaver 12/06/2019, 4:57 PM

## 2019-12-06 NOTE — Anesthesia Procedure Notes (Signed)
Procedure Name: Intubation Date/Time: 12/06/2019 7:37 AM Performed by: Colin Benton, CRNA Pre-anesthesia Checklist: Patient identified, Emergency Drugs available, Suction available and Patient being monitored Patient Re-evaluated:Patient Re-evaluated prior to induction Oxygen Delivery Method: Circle system utilized Preoxygenation: Pre-oxygenation with 100% oxygen Induction Type: IV induction Ventilation: Mask ventilation without difficulty and Oral airway inserted - appropriate to patient size Laryngoscope Size: Sabra Heck and 2 Grade View: Grade II Tube type: Oral Tube size: 7.0 mm Number of attempts: 1 Airway Equipment and Method: Stylet Placement Confirmation: ETT inserted through vocal cords under direct vision,  positive ETCO2 and breath sounds checked- equal and bilateral Secured at: 21 cm Tube secured with: Tape Dental Injury: Teeth and Oropharynx as per pre-operative assessment

## 2019-12-06 NOTE — Anesthesia Procedure Notes (Signed)
Arterial Line Insertion Start/End9/21/2021 6:45 AM, 12/06/2019 6:50 AM Performed by: Colin Benton, CRNA, CRNA  Patient location: Pre-op. Preanesthetic checklist: patient identified, IV checked, site marked, risks and benefits discussed, surgical consent, monitors and equipment checked, pre-op evaluation, timeout performed and anesthesia consent Lidocaine 1% used for infiltration Right, radial was placed Catheter size: 20 G Hand hygiene performed  and maximum sterile barriers used  Allen's test indicative of satisfactory collateral circulation Attempts: 1 Procedure performed without using ultrasound guided technique. Following insertion, dressing applied and Biopatch. Post procedure assessment: normal and unchanged  Patient tolerated the procedure well with no immediate complications.

## 2019-12-06 NOTE — Anesthesia Procedure Notes (Signed)
Central Venous Catheter Insertion Performed by: Darral Dash, DO, anesthesiologist Start/End9/21/2021 7:00 AM, 12/06/2019 7:04 AM Patient location: Pre-op. Preanesthetic checklist: patient identified, IV checked, site marked, risks and benefits discussed, surgical consent, monitors and equipment checked, pre-op evaluation, timeout performed and anesthesia consent Hand hygiene performed  and maximum sterile barriers used  PA cath was placed.Swan type:thermodilution PA Cath depth:45 Procedure performed without using ultrasound guided technique. Attempts: 1 Patient tolerated the procedure well with no immediate complications.

## 2019-12-06 NOTE — Op Note (Signed)
CARDIOVASCULAR SURGERY OPERATIVE NOTE  12/06/2019  Surgeon:  Gaye Pollack, MD  First Assistant: Enid Cutter,  PA-C   Preoperative Diagnosis:  Severe bicuspid aortic valve stenosis and 4.5 cm ascending aortic aneurysm.   Postoperative Diagnosis:  Same   Procedure:  1. Median Sternotomy 2. Extracorporeal circulation 3.   Supra-coronary replacement of the ascending aorta ( Hemi-arch) using a 30 mm Hemashield graft under deep hypothermic circulatory arrest 4.   Aortic valve replacement using a 23 mm Edwards INSPIRIS RESILIA pericardial valve.  Anesthesia:  General Endotracheal   Clinical History/Surgical Indication:  This 74 year old active woman has stage D, severe, symptomatic aortic stenosis with New York Heart Association class II symptoms of exertional shortness of breath consistent with chronic diastolic congestive heart failure. I have personally reviewed her 2D echocardiogram, cardiac catheterization, and CTA studies. Her echocardiogram shows a severely calcified aortic valve with restricted mobility and a mean gradient of 40 mmHg consistent with severe aortic stenosis. There is moderate aortic insufficiency. The ascending aorta measured 44 mm. Left ventricular ejection fraction was normal. Cardiac catheterization showed no coronary disease. Her gated cardiac CTA shows a bicuspid aortic valve with severe aortic stenosis and a 4.5 cm fusiform ascending aortic aneurysm. The aortic root appears unremarkable with a sinus diameter of 39 mm and a well-defined sinotubular junction with a diameter of 31 mm.  I discussed the options for treatment including open surgical aortic valve replacement and replacement of the ascending aortic aneurysm as well as transcatheter aortic valve replacement and continued follow-up of the ascending aortic aneurysm with yearly CT scanning. I discussed the benefits and risk of both approaches. Since she has a bicuspid aortic valve and the ascending  aorta is at 4.5 cm I would favor open surgical aortic valve replacement and supra coronary replacement of the ascending aortic aneurysm. I think this would significantly lower the risk of perivalvular leak, structural valve deterioration requiring further intervention, continued follow-up of her aneurysm, acute aortic dissection, and possible need for replacement of her ascending aorta in the future with a TAVR valve in place. I discussed all this with the patient and her husband and they are in agreement with proceeding with open surgery. I recommended using a bioprosthetic valve and they are in agreement with that.  I discussed the operative procedure with the patient and her husband including alternatives, benefits and risks; including but not limited to bleeding, blood transfusion, infection, stroke, myocardial infarction, graft failure, heart block requiring a permanent pacemaker, organ dysfunction, and death. [name] understands and agrees to proceed.   Preparation:  The patient was seen in the preoperative holding area and the correct patient, correct operation were confirmed with the patient after reviewing the medical record and catheterization. The consent was signed by me. Preoperative antibiotics were given. A pulmonary arterial line and radial arterial line were placed by the anesthesia team. The patient was taken back to the operating room and positioned supine on the operating room table. After being placed under general endotracheal anesthesia by the anesthesia team a foley catheter was placed. The neck, chest, abdomen, and both legs were prepped with betadine soap and solution and draped in the usual sterile manner. A surgical time-out was taken and the correct patient and operative procedure were confirmed with the nursing and anesthesia staff.  TEE:  Performed by Dr. Rochele Pages. This showed a severely calcified aortic valve with severe AS and moderate AI. Normal LV systolic  function.   Cardiopulmonary Bypass:  A  median sternotomy was performed. The pericardium was opened in the midline. Right ventricular function appeared normal. The ascending aorta was aneurysmal but tapered back to normal caliber at the proximal aortic arch and had no palpable plaque. There were no contraindications to aortic cannulation or cross-clamping. The patient was fully systemically heparinized and the ACT was maintained > 400 sec. The mid ascending aorta was cannulated with a 4 F aortic cannula for arterial inflow. Venous cannulation was performed via the right atrial appendage using a two-staged venous cannula. Aortic occlusion was performed with a single cross-clamp after the distal graft to aortic anastomosis was performed under circulatory arrest. Systemic cooling to 18 degrees Centigrade and topical cooling of the heart with iced saline were used. Hyperkalemic retrograde cold blood cardioplegia was used to induce diastolic arrest and was then given retrograde at about 20 minute intervals throughout the period of arrest to maintain myocardial temperature at or below 10 degrees centigrade. A temperature probe was inserted into the interventricular septum and an insulating pad was placed in the pericardium. CO2 was insufflated into the pericardium throughout the case to minimize intracardiac air.    Resection and grafting of ascending aortic aneurysm:  The patient was placed on cardiopulmonary bypass and a left ventricular vent was placed via the right superior pulmonary vein. Systemic cooling was begun with a goal temperature of 18 degrees centigrade by bladder and rectal temperature probes. A retrograde cardioplegia cannula was placed through the right atrium into the coronary sinus without difficulty. A retrograde cerebral perfusion cannula was placed into the SVC through a pursestring suture and the SVC was encircled with a silastic tape. After 20 minutes of cooling the target temperature  of 18 degrees centigrade was reached. Cerebral oximetry was 70% bilaterally. BIS was 5. The patient was given Propofol and 125 mg of Solumedrol. The head was packed in ice. The bed was placed in steep trendelenburg. Circulatory arrest was begun and the blood volume emptied into the venous reservoir. Continuous retrograde cerebral perfusion was begun and the SVC occluded with the silastic tape. Cold blood retrograde cardioplegia was given and myocardial temperature dropped to 10 degrees centigrade. Additional doses were given at approximately 20 minute intervals throughout the period of circulatory arrest and cross-clamping. Complete diastolic arrest was maintained. The aortic cannula was removed. The aorta was transected just proximal to the innominate artery beveling the resection out along the undersurface of the aortic arch (Hemiarch replacement). The aortic diameter was measured at 30 mm here. A 30 x 10 mm Hemasheild Platinum vascular graft was prepared. ( Catalog # [id number] P0, Lot [id number], SN [id number]). It was anastomosed to the aortic arch in an end to end manner using 3-0 prolene continuous suture with a felt strip to reinforce the anastomisis. A light coating of CoSeal was applied to seal needle holes. The arterial end of the bypass circuit was then connected to the 13mm side arm graft and circulation was slowly resumed. The tape was removed from the SVC. The aortic graft was cross-clamped proximal to the side arm graft and full CPB support was resumed. Circulatory arrest time was 19 minutes. Retrograde cerebral perfusion time was 14 minutes. The patient was rewarmed to 32 degrees C.  Aortic Valve Replacement:   The proximal aorta was transected just distal to the STJ. The STJ and aortic root appeared to be of fairly normal size with an STJ diameter of 30 mm.  The sinuses were minimally enlarged as expected with a bicuspid valve. The native valve was  bicuspid with a single raphe and 3 commissures.   The valve had severely calcified leaflets and mild annular calcification. The ostia of the coronary arteries were in normal position and were not obstructed. The native valve leaflets were excised and the annulus was decalcified with rongeurs. Care was taken to remove all particulate debris. The left ventricle was directly inspected for debris and then irrigated with ice saline solution. The annulus was sized and a size 23 mm Edwards INSPIRIS RESILIA pericardial valve was chosen. The model number was [id number] and the serial number was [id number].  While the valve was being prepared 2-0 Ethibond pledgeted horizontal mattress sutures were placed around the annulus with the pledgets in a sub-annular position. The sutures were placed through the sewing ring and the valve lowered into place. The sutures were tied sequentially. The valve seated nicely and the coronary ostia were not obstructed. The prosthetic valve leaflets moved normally and there was no sub-valvular obstruction.   The aortic graft was cut to the appropriate length and anastomosed end to end to the supra-coronary aorta using continuous 3-0 prolene suture with a felt strip for reinforcement of the anastomosis. CoSeal was applied to seal the needle holes in the grafts. A vent cannula was placed into the graft to remove any air. Deairing maneuvers were performed and the bed placed in trendelenburg position.   Completion:   The patient was rewarmed to 37 degrees Centigrade. The crossclamp was removed with a time of 87 minutes. There was spontaneous return of ventricular fibrillation and the patient was defibrillated to sinus rhythm. The position of the graft was satisfactory. The vascular anastomoses all appeared hemostatic. Two temporary epicardial pacing wires were placed on the right atrium and two on the right ventricle. The patient was weaned from CPB without difficulty on no inotropes.  CPB time was 154 minutes. Cardiac output was 5 LPM. TEE showed a  normal functioning aortic valve prosthesis with no AI or paravalvular leak. There was no MR. LV function appeared normal. Heparin was fully reversed with protamine and the aortic and venous cannulas removed. Hemostasis was achieved. Two mediastinal drainage tubes were placed. The sternum was closed with  #6 stainless steel wires. The fascia was closed with continuous # 1 vicryl suture. The subcutaneous tissue was closed with 2-0 vicryl continuous suture. The skin was closed with 3-0 vicryl subcuticular suture. All sponge, needle, and instrument counts were reported correct at the end of the case. Dry sterile dressings were placed over the incisions and around the chest tubes which were connected to pleurevac suction. The patient was then transported to the surgical intensive care unit in  stable condition.

## 2019-12-06 NOTE — Interval H&P Note (Signed)
History and Physical Interval Note:  12/06/2019 6:58 AM  Joann Barry  has presented today for surgery, with the diagnosis of aortic stenosis.  The various methods of treatment have been discussed with the patient and family. After consideration of risks, benefits and other options for treatment, the patient has consented to  Procedure(s) with comments: AORTIC VALVE REPLACEMENT (AVR) (N/A) REPLACEMENT OF ASCENDING AORTIC ANEURYSM (N/A) - CIRC ARREST TRANSESOPHAGEAL ECHOCARDIOGRAM (TEE) (N/A) as a surgical intervention.  The patient's history has been reviewed, patient examined, no change in status, stable for surgery.  I have reviewed the patient's chart and labs.  Questions were answered to the patient's satisfaction.     Gaye Pollack

## 2019-12-06 NOTE — Anesthesia Procedure Notes (Signed)
Central Venous Catheter Insertion Performed by: Darral Dash, DO, anesthesiologist Start/End9/21/2021 6:45 AM, 12/06/2019 6:59 AM Patient location: Pre-op. Preanesthetic checklist: patient identified, IV checked, site marked, risks and benefits discussed, surgical consent, monitors and equipment checked, pre-op evaluation, timeout performed and anesthesia consent Position: Trendelenburg Lidocaine 1% used for infiltration and patient sedated Hand hygiene performed  and maximum sterile barriers used  Catheter size: 9 Fr MAC introducer Procedure performed using ultrasound guided technique. Ultrasound Notes:anatomy identified, needle tip was noted to be adjacent to the nerve/plexus identified, no ultrasound evidence of intravascular and/or intraneural injection and image(s) printed for medical record Attempts: 1 Following insertion, line sutured and dressing applied. Post procedure assessment: blood return through all ports, free fluid flow and no air  Patient tolerated the procedure well with no immediate complications.

## 2019-12-06 NOTE — Brief Op Note (Signed)
12/06/2019  11:34 AM  PATIENT:  Joann Barry  74 y.o. female  PRE-OPERATIVE DIAGNOSIS:  aortic stenosis  POST-OPERATIVE DIAGNOSIS:  aortic stenosis  PROCEDURE:  AORTIC VALVE REPLACEMENT WITH 23 INSPIRIS RESILIA BIOPROSTHETIC VALVE  REPAIR OF ASCENDING AORTIC ANEURYSM WITH A HEMASHIELD GRAFT USING HYPOTHERMIC CIRCULATORY ARREST    TRANSESOPHAGEAL ECHOCARDIOGRAM   SURGEON:  Gaye Pollack, MD  PHYSICIAN ASSISTANT: Charlann Wayne  ANESTHESIA:   general  EBL:  Per perfusion and anesthesia records    BLOOD ADMINISTERED: PRBC's transfused on pump  DRAINS: Mediastinal drains x 2   LOCAL MEDICATIONS USED:  NONE  SPECIMEN: aortic valve leaflets, aneurysmal ascending aorta  DISPOSITION OF SPECIMEN:  pathology  COUNTS:  YES  DICTATION: .Dragon Dictation  PLAN OF CARE: Admit to inpatient   PATIENT DISPOSITION:  ICU - intubated and hemodynamically stable.   Delay start of Pharmacological VTE agent (>24hrs) due to surgical blood loss or risk of bleeding: yes

## 2019-12-06 NOTE — Anesthesia Postprocedure Evaluation (Signed)
Anesthesia Post Note  Patient: Joann Barry  Procedure(s) Performed: AORTIC VALVE REPLACEMENT (AVR) (N/A Chest) REPLACEMENT OF ASCENDING AORTIC ANEURYSM (N/A Chest) TRANSESOPHAGEAL ECHOCARDIOGRAM (TEE) (N/A Esophagus)     Patient location during evaluation: SICU Anesthesia Type: General Level of consciousness: sedated Pain management: pain level controlled Vital Signs Assessment: post-procedure vital signs reviewed and stable Respiratory status: patient remains intubated per anesthesia plan Cardiovascular status: stable Postop Assessment: no apparent nausea or vomiting Anesthetic complications: no   No complications documented.  Last Vitals:  Vitals:   12/06/19 1400 12/06/19 1414  BP: 96/66   Pulse: 80 80  Resp: 12 12  Temp: (!) 35.5 C (!) 35.7 C  SpO2: 100% 100%    Last Pain:  Vitals:   12/06/19 1359  TempSrc: Core  PainSc:                  March Rummage Billie Trager

## 2019-12-07 ENCOUNTER — Encounter (HOSPITAL_COMMUNITY): Payer: Self-pay | Admitting: Surgery

## 2019-12-07 ENCOUNTER — Inpatient Hospital Stay (HOSPITAL_COMMUNITY): Payer: Medicare Other

## 2019-12-07 LAB — BASIC METABOLIC PANEL
Anion gap: 6 (ref 5–15)
Anion gap: 7 (ref 5–15)
BUN: 20 mg/dL (ref 8–23)
BUN: 23 mg/dL (ref 8–23)
CO2: 21 mmol/L — ABNORMAL LOW (ref 22–32)
CO2: 25 mmol/L (ref 22–32)
Calcium: 8 mg/dL — ABNORMAL LOW (ref 8.9–10.3)
Calcium: 8.5 mg/dL — ABNORMAL LOW (ref 8.9–10.3)
Chloride: 109 mmol/L (ref 98–111)
Chloride: 105 mmol/L (ref 98–111)
Creatinine, Ser: 0.81 mg/dL (ref 0.44–1.00)
Creatinine, Ser: 0.98 mg/dL (ref 0.44–1.00)
GFR calc Af Amer: >60 mL/min (ref >60)
GFR calc Af Amer: >60 mL/min (ref >60)
GFR calc non Af Amer: >60 mL/min (ref >60)
GFR calc non Af Amer: 56 mL/min — ABNORMAL LOW (ref >60)
Glucose, Bld: 150 mg/dL — ABNORMAL HIGH (ref 70–99)
Glucose, Bld: 121 mg/dL — ABNORMAL HIGH (ref 70–99)
Potassium: 4.8 mmol/L (ref 3.5–5.1)
Potassium: 5 mmol/L (ref 3.5–5.1)
Sodium: 136 mmol/L (ref 135–145)
Sodium: 137 mmol/L (ref 135–145)

## 2019-12-07 LAB — BPAM CRYOPRECIPITATE
Blood Product Expiration Date: 202109211803
Blood Product Expiration Date: 202109211803
ISSUE DATE / TIME: 202109211235
ISSUE DATE / TIME: 202109211235
Unit Type and Rh: 5100
Unit Type and Rh: 5100

## 2019-12-07 LAB — GLUCOSE, CAPILLARY
Glucose-Capillary: 129 mg/dL — ABNORMAL HIGH (ref 70–99)
Glucose-Capillary: 131 mg/dL — ABNORMAL HIGH (ref 70–99)
Glucose-Capillary: 131 mg/dL — ABNORMAL HIGH (ref 70–99)
Glucose-Capillary: 131 mg/dL — ABNORMAL HIGH (ref 70–99)
Glucose-Capillary: 135 mg/dL — ABNORMAL HIGH (ref 70–99)
Glucose-Capillary: 97 mg/dL (ref 70–99)

## 2019-12-07 LAB — SURGICAL PATHOLOGY

## 2019-12-07 LAB — CBC
HCT: 29.4 % — ABNORMAL LOW (ref 36.0–46.0)
HCT: 30.1 % — ABNORMAL LOW (ref 36.0–46.0)
Hemoglobin: 9.8 g/dL — ABNORMAL LOW (ref 12.0–15.0)
Hemoglobin: 9.8 g/dL — ABNORMAL LOW (ref 12.0–15.0)
MCH: 30.1 pg (ref 26.0–34.0)
MCH: 29.7 pg (ref 26.0–34.0)
MCHC: 33.3 g/dL (ref 30.0–36.0)
MCHC: 32.6 g/dL (ref 30.0–36.0)
MCV: 90.2 fL (ref 80.0–100.0)
MCV: 91.2 fL (ref 80.0–100.0)
Platelets: 102 10*3/uL — ABNORMAL LOW (ref 150–400)
Platelets: 115 10*3/uL — ABNORMAL LOW (ref 150–400)
RBC: 3.26 MIL/uL — ABNORMAL LOW (ref 3.87–5.11)
RBC: 3.3 MIL/uL — ABNORMAL LOW (ref 3.87–5.11)
RDW: 13.4 % (ref 11.5–15.5)
RDW: 13.8 % (ref 11.5–15.5)
WBC: 9.4 10*3/uL (ref 4.0–10.5)
WBC: 15.5 10*3/uL — ABNORMAL HIGH (ref 4.0–10.5)
nRBC: 0 % (ref 0.0–0.2)
nRBC: 0 % (ref 0.0–0.2)

## 2019-12-07 LAB — PREPARE CRYOPRECIPITATE
Unit division: 0
Unit division: 0

## 2019-12-07 LAB — MAGNESIUM
Magnesium: 2.5 mg/dL — ABNORMAL HIGH (ref 1.7–2.4)
Magnesium: 2.4 mg/dL (ref 1.7–2.4)

## 2019-12-07 MED ORDER — METOCLOPRAMIDE HCL 5 MG/ML IJ SOLN
10.0000 mg | Freq: Four times a day (QID) | INTRAMUSCULAR | Status: AC
  Administered 2019-12-07 (×4): 10 mg via INTRAVENOUS
  Filled 2019-12-07 (×4): qty 2

## 2019-12-07 MED ORDER — SODIUM CHLORIDE 0.9 % IV SOLN
INTRAVENOUS | Status: DC | PRN
  Administered 2019-12-07: 500 mL via INTRAVENOUS

## 2019-12-07 MED ORDER — FUROSEMIDE 10 MG/ML IJ SOLN
40.0000 mg | Freq: Two times a day (BID) | INTRAMUSCULAR | Status: DC
  Administered 2019-12-07: 40 mg via INTRAVENOUS
  Filled 2019-12-07: qty 4

## 2019-12-07 MED ORDER — INSULIN ASPART 100 UNIT/ML ~~LOC~~ SOLN
0.0000 [IU] | SUBCUTANEOUS | Status: DC
  Administered 2019-12-07 (×3): 2 [IU] via SUBCUTANEOUS

## 2019-12-07 NOTE — Plan of Care (Signed)

## 2019-12-07 NOTE — Addendum Note (Signed)
Addendum  created 12/07/19 0927 by Sammie Bench, CRNA   Order list changed

## 2019-12-07 NOTE — Progress Notes (Signed)
Patient ID: Joann Barry, female   DOB: 1945/10/16, 74 y.o.   MRN: [id number] [facility] Evening Rounds:  Hemodynamically stable in sinus rhythm.  Diuresed well today. Will hold lasix tonight.  Chest tube output low. Tubes will be removed.  PM labs pending.

## 2019-12-07 NOTE — Progress Notes (Signed)
1 Day Post-Op Procedure(s) (LRB): AORTIC VALVE REPLACEMENT (AVR) (N/A) REPLACEMENT OF ASCENDING AORTIC ANEURYSM (N/A) TRANSESOPHAGEAL ECHOCARDIOGRAM (TEE) (N/A) Subjective: Some nausea and emesis overnight. Feels better now. No pain.  Objective: Vital signs in last 24 hours: Temp:  [95.9 F (35.5 C)-98.8 F (37.1 C)] 98.6 F (37 C) (09/22 0700) Pulse Rate:  [73-99] 82 (09/22 0700) Cardiac Rhythm: Normal sinus rhythm (09/22 0600) Resp:  [10-20] 10 (09/22 0600) BP: (88-138)/(58-102) 105/64 (09/22 0700) SpO2:  [94 %-100 %] 96 % (09/22 0700) Arterial Line BP: (88-150)/(41-85) 134/55 (09/22 0700) FiO2 (%):  [40 %-50 %] 40 % (09/21 1608) Weight:  [75.9 kg] 75.9 kg (09/22 0500)  Hemodynamic parameters for last 24 hours: PAP: (18-51)/(8-37) 37/20 CO:  [3.5 L/min-5.3 L/min] 5 L/min CI:  [2 L/min/m2-3.3 L/min/m2] 2.9 L/min/m2  Intake/Output from previous day: 09/21 0701 - 09/22 0700 In: 4901 [P.O.:100; I.V.:2504.6; Blood:710; IV Piggyback:1586.4] Out: 3401 [Urine:1955; Blood:936; Chest Tube:510] Intake/Output this shift: No intake/output data recorded.  General appearance: alert and cooperative Neurologic: intact Heart: regular rate and rhythm, S1, S2 normal, no murmur Lungs: clear to auscultation bilaterally Abdomen: soft, non-tender; bowel sounds normal Extremities: edema mild Wound: dressing dry  Lab Results: Recent Labs    12/06/19 1848 12/06/19 1848 12/06/19 1851 12/07/19 0326  WBC 9.7  --   --  9.4  HGB 10.3*   < > 10.2* 9.8*  HCT 31.1*   < > 30.0* 29.4*  PLT 108*  --   --  102*   < > = values in this interval not displayed.   BMET:  Recent Labs    12/06/19 1848 12/06/19 1848 12/06/19 1851 12/07/19 0326  NA 138   < > 140 136  K 5.1   < > 5.1 4.8  CL 108  --   --  109  CO2 21*  --   --  21*  GLUCOSE 193*  --   --  150*  BUN 19  --   --  20  CREATININE 0.76  --   --  0.81  CALCIUM 7.9*  --   --  8.0*   < > = values in this interval not displayed.     PT/INR:  Recent Labs    12/06/19 1312  LABPROT 18.2*  INR 1.6*   ABG    Component Value Date/Time   PHART 7.305 (L) 12/06/2019 1851   HCO3 21.9 12/06/2019 1851   TCO2 23 12/06/2019 1851   ACIDBASEDEF 4.0 (H) 12/06/2019 1851   O2SAT 97.0 12/06/2019 1851   CBG (last 3)  Recent Labs    12/06/19 2339 12/07/19 0328 12/07/19 0634  GLUCAP 172* 135* 131*   CXR: clear  ECG: NSR, no acute changes  Assessment/Plan: S/P Procedure(s) (LRB): AORTIC VALVE REPLACEMENT (AVR) (N/A) REPLACEMENT OF ASCENDING AORTIC ANEURYSM (N/A) TRANSESOPHAGEAL ECHOCARDIOGRAM (TEE) (N/A)  POD 1  Hemodynamically stable in sinus rhythm. Continue Lopressor.  DC arterial line and swan.  Chest tube output moderate thin serosanguinous. Will plan to remove later this morning if output continues to decline.  Volume excess: wt 10 lbs over preop. Start diuresis.  IS, OOB.  Glucose coming down. No hx of DM with Hgb A1c of 5. Will continue CBG's Q4 today since she received steroids in OR for circ arrest.   LOS: 1 day    Gaye Pollack 12/07/2019

## 2019-12-08 ENCOUNTER — Inpatient Hospital Stay (HOSPITAL_COMMUNITY): Payer: Medicare Other

## 2019-12-08 ENCOUNTER — Other Ambulatory Visit: Payer: Self-pay | Admitting: Medical

## 2019-12-08 DIAGNOSIS — I712 Thoracic aortic aneurysm, without rupture, unspecified: Secondary | ICD-10-CM

## 2019-12-08 DIAGNOSIS — Z953 Presence of xenogenic heart valve: Secondary | ICD-10-CM

## 2019-12-08 DIAGNOSIS — I350 Nonrheumatic aortic (valve) stenosis: Secondary | ICD-10-CM

## 2019-12-08 LAB — BASIC METABOLIC PANEL
Anion gap: 5 (ref 5–15)
BUN: 21 mg/dL (ref 8–23)
CO2: 26 mmol/L (ref 22–32)
Calcium: 8.4 mg/dL — ABNORMAL LOW (ref 8.9–10.3)
Chloride: 107 mmol/L (ref 98–111)
Creatinine, Ser: 0.83 mg/dL (ref 0.44–1.00)
GFR calc Af Amer: >60 mL/min (ref >60)
GFR calc non Af Amer: >60 mL/min (ref >60)
Glucose, Bld: 128 mg/dL — ABNORMAL HIGH (ref 70–99)
Potassium: 4.5 mmol/L (ref 3.5–5.1)
Sodium: 138 mmol/L (ref 135–145)

## 2019-12-08 LAB — CBC
HCT: 28.4 % — ABNORMAL LOW (ref 36.0–46.0)
Hemoglobin: 9.3 g/dL — ABNORMAL LOW (ref 12.0–15.0)
MCH: 30.6 pg (ref 26.0–34.0)
MCHC: 32.7 g/dL (ref 30.0–36.0)
MCV: 93.4 fL (ref 80.0–100.0)
Platelets: 93 10*3/uL — ABNORMAL LOW (ref 150–400)
RBC: 3.04 MIL/uL — ABNORMAL LOW (ref 3.87–5.11)
RDW: 13.7 % (ref 11.5–15.5)
WBC: 8.8 10*3/uL (ref 4.0–10.5)
nRBC: 0 % (ref 0.0–0.2)

## 2019-12-08 LAB — GLUCOSE, CAPILLARY
Glucose-Capillary: 117 mg/dL — ABNORMAL HIGH (ref 70–99)
Glucose-Capillary: 121 mg/dL — ABNORMAL HIGH (ref 70–99)

## 2019-12-08 MED ORDER — ACETAMINOPHEN 325 MG PO TABS: 650.0000 mg | ORAL_TABLET | Freq: Four times a day (QID) | ORAL | Status: DC | PRN

## 2019-12-08 MED ORDER — ROSUVASTATIN CALCIUM 20 MG PO TABS
20.0000 mg | ORAL_TABLET | Freq: Every evening | ORAL | Status: DC
  Administered 2019-12-09: 20 mg via ORAL
  Filled 2019-12-08 (×2): qty 1

## 2019-12-08 MED ORDER — PANTOPRAZOLE SODIUM 40 MG PO TBEC
40.0000 mg | DELAYED_RELEASE_TABLET | Freq: Every day | ORAL | Status: DC
  Administered 2019-12-09 – 2019-12-10 (×2): 40 mg via ORAL
  Filled 2019-12-08 (×2): qty 1

## 2019-12-08 MED ORDER — ONDANSETRON HCL 4 MG PO TABS
4.0000 mg | ORAL_TABLET | Freq: Four times a day (QID) | ORAL | Status: DC | PRN
  Administered 2019-12-08: 4 mg via ORAL
  Filled 2019-12-08: qty 1

## 2019-12-08 MED ORDER — SODIUM CHLORIDE 0.9 % IV SOLN: 250.0000 mL | INTRAVENOUS | Status: DC | PRN

## 2019-12-08 MED ORDER — SENNOSIDES-DOCUSATE SODIUM 8.6-50 MG PO TABS: 1.0000 | ORAL_TABLET | Freq: Two times a day (BID) | ORAL | Status: DC | PRN

## 2019-12-08 MED ORDER — TRAMADOL HCL 50 MG PO TABS: 50.0000 mg | ORAL_TABLET | Freq: Four times a day (QID) | ORAL | Status: DC | PRN

## 2019-12-08 MED ORDER — ONDANSETRON HCL 4 MG/2ML IJ SOLN: 4.0000 mg | Freq: Four times a day (QID) | INTRAMUSCULAR | Status: DC | PRN

## 2019-12-08 MED ORDER — POTASSIUM CHLORIDE CRYS ER 20 MEQ PO TBCR
20.0000 meq | EXTENDED_RELEASE_TABLET | Freq: Every day | ORAL | Status: AC
  Administered 2019-12-08 – 2019-12-10 (×3): 20 meq via ORAL
  Filled 2019-12-08 (×3): qty 1

## 2019-12-08 MED ORDER — SODIUM CHLORIDE 0.9% FLUSH
3.0000 mL | Freq: Two times a day (BID) | INTRAVENOUS | Status: DC
  Administered 2019-12-09: 3 mL via INTRAVENOUS

## 2019-12-08 MED ORDER — OXYCODONE HCL 5 MG PO TABS: 5.0000 mg | ORAL_TABLET | ORAL | Status: DC | PRN

## 2019-12-08 MED ORDER — SODIUM CHLORIDE 0.9% FLUSH: 3.0000 mL | INTRAVENOUS | Status: DC | PRN

## 2019-12-08 MED ORDER — FUROSEMIDE 40 MG PO TABS
40.0000 mg | ORAL_TABLET | Freq: Every day | ORAL | Status: AC
  Administered 2019-12-08 – 2019-12-10 (×3): 40 mg via ORAL
  Filled 2019-12-08 (×3): qty 1

## 2019-12-08 MED ORDER — ~~LOC~~ CARDIAC SURGERY, PATIENT & FAMILY EDUCATION: Freq: Once | Status: DC

## 2019-12-08 MED ORDER — TRAMADOL HCL 50 MG PO TABS: 50.0000 mg | ORAL_TABLET | ORAL | Status: DC | PRN

## 2019-12-08 MED ORDER — ASPIRIN EC 325 MG PO TBEC: 325.0000 mg | DELAYED_RELEASE_TABLET | Freq: Every day | ORAL | Status: DC

## 2019-12-08 MED FILL — Electrolyte-R (PH 7.4) Solution: INTRAVENOUS | Qty: 4000 | Status: AC

## 2019-12-08 MED FILL — Sodium Bicarbonate IV Soln 8.4%: INTRAVENOUS | Qty: 50 | Status: AC

## 2019-12-08 MED FILL — Lidocaine HCl Local Soln Prefilled Syringe 100 MG/5ML (2%): INTRAMUSCULAR | Qty: 10 | Status: AC

## 2019-12-08 MED FILL — Albumin, Human Inj 5%: INTRAVENOUS | Qty: 250 | Status: AC

## 2019-12-08 MED FILL — Heparin Sodium (Porcine) Inj 1000 Unit/ML: INTRAMUSCULAR | Qty: 30 | Status: AC

## 2019-12-08 MED FILL — Thrombin (Recombinant) For Soln 20000 Unit: CUTANEOUS | Qty: 1 | Status: AC

## 2019-12-08 MED FILL — Heparin Sodium (Porcine) Inj 1000 Unit/ML: INTRAMUSCULAR | Qty: 2500 | Status: AC

## 2019-12-08 MED FILL — Potassium Chloride Inj 2 mEq/ML: INTRAVENOUS | Qty: 40 | Status: AC

## 2019-12-08 MED FILL — Sodium Chloride IV Soln 0.9%: INTRAVENOUS | Qty: 3000 | Status: AC

## 2019-12-08 MED FILL — Magnesium Sulfate Inj 50%: INTRAMUSCULAR | Qty: 10 | Status: AC

## 2019-12-08 MED FILL — Mannitol IV Soln 20%: INTRAVENOUS | Qty: 500 | Status: AC

## 2019-12-08 MED FILL — Heparin Sodium (Porcine) Inj 1000 Unit/ML: INTRAMUSCULAR | Qty: 10 | Status: AC

## 2019-12-08 NOTE — Progress Notes (Signed)
Pt received to room, alert and oriented in no distress.  Oriented to room, monitors applied, VS taken and CHG bath performed.  Pt ambulated to bathroom safely without assistance.  Left sitting on EOB eating lunch with call bell and cell phone in reach.  Will con't plan of care.

## 2019-12-08 NOTE — Progress Notes (Signed)
2 Days Post-Op Procedure(s) (LRB): AORTIC VALVE REPLACEMENT (AVR) (N/A) REPLACEMENT OF ASCENDING AORTIC ANEURYSM (N/A) TRANSESOPHAGEAL ECHOCARDIOGRAM (TEE) (N/A) Subjective: No complaints. Got some sleep. Ambulated this am.  Objective: Vital signs in last 24 hours: Temp:  [97.7 F (36.5 C)-98.8 F (37.1 C)] 98.6 F (37 C) (09/23 0333) Pulse Rate:  [27-87] 87 (09/23 0700) Cardiac Rhythm: Normal sinus rhythm (09/23 0345) Resp:  [12-29] 18 (09/23 0700) BP: (83-143)/(47-112) 143/74 (09/23 0700) SpO2:  [88 %-100 %] 93 % (09/23 0700) Arterial Line BP: (96-127)/(39-52) 96/39 (09/22 0845) Weight:  [74.1 kg] 74.1 kg (09/23 0420)  Hemodynamic parameters for last 24 hours: PAP: (30-31)/(9-13) 31/13 CO:  [5 L/min] 5 L/min CI:  [2.8 L/min/m2] 2.8 L/min/m2  Intake/Output from previous day: 09/22 0701 - 09/23 0700 In: 972.6 [P.O.:420; I.V.:352.6; IV Piggyback:200] Out: 1730 [Urine:1600; Chest Tube:130] Intake/Output this shift: No intake/output data recorded.  General appearance: alert and cooperative Neurologic: intact Heart: regular rate and rhythm, S1, S2 normal, no murmur Lungs: clear to auscultation bilaterally Extremities: no edema Wound: dressing dry  Lab Results: Recent Labs    12/07/19 1629 12/08/19 0336  WBC 15.5* 8.8  HGB 9.8* 9.3*  HCT 30.1* 28.4*  PLT 115* 93*   BMET:  Recent Labs    12/07/19 1629 12/08/19 0336  NA 137 138  K 5.0 4.5  CL 105 107  CO2 25 26  GLUCOSE 121* 128*  BUN 23 21  CREATININE 0.98 0.83  CALCIUM 8.5* 8.4*    PT/INR:  Recent Labs    12/06/19 1312  LABPROT 18.2*  INR 1.6*   ABG    Component Value Date/Time   PHART 7.305 (L) 12/06/2019 1851   HCO3 21.9 12/06/2019 1851   TCO2 23 12/06/2019 1851   ACIDBASEDEF 4.0 (H) 12/06/2019 1851   O2SAT 97.0 12/06/2019 1851   CBG (last 3)  Recent Labs    12/07/19 1926 12/07/19 2341 12/08/19 0328  GLUCAP 129* 97 117*    Assessment/Plan: S/P Procedure(s) (LRB): AORTIC VALVE  REPLACEMENT (AVR) (N/A) REPLACEMENT OF ASCENDING AORTIC ANEURYSM (N/A) TRANSESOPHAGEAL ECHOCARDIOGRAM (TEE) (N/A)  POD 2  Hemodynamically stable in sinus rhythm. Continue Lopressor.  Volume excess: wt 5 lbs over preop. Continue diuresis.  IS, OOB.  Glucose staying down. Will DC CBG's  Transfer to 4E.   LOS: 2 days    Gaye Pollack 12/08/2019

## 2019-12-08 NOTE — Progress Notes (Signed)
Mobility Specialist - Progress Note   12/08/19 1337  Mobility  Activity Ambulated in hall  Level of Assistance Independent  Assistive Device None  Distance Ambulated (ft) 490 ft  Mobility Response Tolerated well  Mobility performed by Mobility specialist  $Mobility charge 1 Mobility    Pre-mobility: 93 HR During mobility: 104 HR Post-mobility: 99 HR  Pt asx throughout ambulation.   Pricilla Handler Mobility Specialist Mobility Specialist Phone: [phone]

## 2019-12-08 NOTE — Hospital Course (Addendum)
Referring Provider is [name], MD  Primary Cardiologist is [name], MD  PCP is [name], MD         Chief Complaint  Patient presents with   Aortic Stenosis           History of Present Illness: The patient is a 74 year old active woman with a history of hypertension, hyperlipidemia, and aortic stenosis who was referred for consideration of TAVR versus open AVR.  She said that she was diagnosed with a heart murmur in 2018 by [name] and was referred to Dr. [name] for evaluation. An echocardiogram in 2018 showed a mean gradient of 28 mmHg and a peak gradient of 45 mmHg with a dimensionless index of 0.24. She was asymptomatic at that time. A follow-up echo in 2019 showed an increase in the mean gradient to 34 mmHg with a dimensionless index of 0.19. She said that she continued to feel well. Continued follow-up was recommended. She had a follow-up echocardiogram on 08/24/2019 which showed a further increase in the mean gradient to 40 mmHg consistent with severe aortic stenosis with a dimensionless index of 0.16 and a calculated aortic valve area of 0.5 cm. Left ventricular ejection fraction was 55 to 60%. The ascending aorta was measured at 44 mm and was unchanged from her echo in 2018 was measured at 45 mm. She subsequently underwent cardiac catheterization on 11/11/2019 which showed mildly elevated PA pressures of 38/23. There was no coronary disease. She denies any fatigue or chest discomfort. She has had some exertional shortness of breath with carrying groceries or feeling stressed. She denies any dizziness or syncope. She has had no orthopnea or PND. She denies any peripheral edema. 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 She does report being tired at the end of the day.   Hospital Course:  Ms Polo was admitted for elective surgery on 12/06/19 and taken to the OR where the aortic valve was replaced with a 1mm Inspiris  Reslila bioprosthetic valve and the ascending aortic aneurysm was replaced with a 37mm Hemashield straight graft under hypothermic circulatory arrest. Following the procedure, she separated from cardiopulmonary bypass without difficulty and was transferred to the ICU in stable condition. She was weaned from the ventilator and successfully extubated on the afternoon following surgery.  She was mobilized with physical therapy on the first post-op day and made satisfactory progress.  She was transferred to [facility] on the second post-op day. She was diuresed for significant volume overload with good response.  The patient has remained in NSR.  Her pacing wires were removed without difficulty.  She was hypertensive and her lopressor dose was increased.  She has a history of hypothyroidism and was restarted on home dose of synthroid.  Her surgical incisions are healing without evidence of infection.  She is ambulating without difficulty.  She is medically stable for discharge home today.  C RIGHT HEART CATH AND CORONARY ANGIOGRAPHY  Conclusion  Normal to mildly elevated right heart pressures with PA systolic pressure at 38 and mean pressure at 23.   Normal coronary arteries.   Previous echo Doppler determination of severe aortic stenosis with a peak gradient of 66, mean gradient of 40, DI of 0.16 and AVA of 0.5 cm.   RECOMMENDATION:  Patient will return to the structural heart team for further evaluation of TAVR candidacy.   To further evaluate the size of her ascending aorta, she will be  scheduled for a gated cardiac CTA as well as a CTA of the chest, abdomen, and pelvis.   Recommendations  Antiplatelet/Anticoag Patient will undergo evaluation for possible TAVR versus surgical aortic valve replacement.  Indications  Severe aortic stenosis [I35.0 (ICD-10-CM)]  Procedural Details  Technical Details Ms. Shyenne Maggard is a 74 year old female who has developed progressive aortic valve  stenosis.  Her most recent echo Doppler study has shown a peak gradient of 66, mean gradient of 44, aortic valve area of 0.5 cm, dimensionless index of 0.16.  She is felt to have severe D1 aortic stenosis, functional class II.  I referred her to Dr. [name] for consideration of TAVR candidacy.  She also has a mildly dilated aortic root.  She is now referred for definitive right and left heart catheterization with coronary angiography.  The patient arrived to the catheterization laboratory in the fasting state.  She was premedicated with Versed 2 mg and fentanyl 25 mcg.  An IV was sent in the brachial vein.  This was exchanged under sterile technique a 5 French glide sheath slender sheath.  A 5 French right heart catheter was then inserted and pressures were recorded in the right atrium, right ventricle, pulmonary artery, and pulmonary capillary wedge positions.  Oxygen saturation was obtained in the PA.  Patient received an additional 1 mg of Versed and 25 mcg of fentanyl.  Ultrasound guidance was used for right radial access.  Right radial artery was punctured without difficulty and a 6 [place] Glidesheath slender sheath was inserted.  Verapamil 3 mg was administered.  Initially a take catheter was inserted but due to the dilated aortic root this was then removed.  A JR4 catheter was used for selective angiography into the right coronary artery.  A JL4 catheter was used for selective angiography into the left coronary system.  Fluoroscopy revealed a severely calcified aortic valve with reduced excursion.  Since the patient has been documented to have severe aortic stenosis, no attempt was made to cross the aortic valve.  The patient will undergo CTA imaging of her aorta as part of her TAVR work-up.  All catheters were removed from the patient.  Hemostasis was obtained for the brachial access.  A TR band was inserted for right radial hemostasis.  The patient left the catheterization laboratory chest pain-free with  stable hemodynamics. Estimated blood loss <50 mL.   During this procedure medications were administered to achieve and maintain moderate conscious sedation while the patient's heart rate, blood pressure, and oxygen saturation were continuously monitored and I was present face-to-face 100% of this time.  Medications (Filter: Administrations occurring from [phone] to 1052 on 11/11/19) Heparin (Porcine) in NaCl 1000-0.9 UT/500ML-% SOLN (mL) Total volume:  1,000 mL  Date/Time  Rate/Dose/Volume Action  11/11/19 0937  500 mL Given  0938  500 mL Given    midazolam (VERSED) injection (mg) Total dose:  3 mg  Date/Time  Rate/Dose/Volume Action  11/11/19 0940  2 mg Given  1011  1 mg Given    fentaNYL (SUBLIMAZE) injection (mcg) Total dose:  75 mcg  Date/Time  Rate/Dose/Volume Action  11/11/19 0940  50 mcg Given  1011  25 mcg Given    lidocaine (PF) (XYLOCAINE) 1 % injection (mL) Total volume:  4 mL  Date/Time  Rate/Dose/Volume Action  11/11/19 1011  2 mL Given  1020  2 mL Given    Radial Cocktail/Verapamil only (mL) Total volume:  10 mL  Date/Time  Rate/Dose/Volume Action  11/11/19 1026  10  mL Given    heparin sodium (porcine) injection (Units) Total dose:  3,500 Units  Date/Time  Rate/Dose/Volume Action  11/11/19 1029  3,500 Units Given    iohexol (OMNIPAQUE) 350 MG/ML injection (mL) Total volume:  55 mL  Date/Time  Rate/Dose/Volume Action  11/11/19 1047  55 mL Given    Sedation Time  Sedation Time Physician-1: 1 hour 1 minute 44 seconds  Contrast  Medication Name Total Dose  iohexol (OMNIPAQUE) 350 MG/ML injection 55 mL    Radiation/Fluoro  Fluoro time: 7.1 (min) DAP: 10789 (mGycm2) Cumulative Air Kerma: 179 (mGy)  Coronary Findings  Diagnostic Dominance: Right Ramus Intermedius  Vessel is small.  Intervention  No interventions have been documented. Right Heart  Right Heart Pressures RA: A-wave 7, V wave 6, mean 4 RV: 38/6 PA: 38/11; mean 23 PW;  A-wave 18;  V wave 17; mean 12  Central aortic pressure 111/47; mean 70  Oxygen saturation in the pulmonary artery 71% and in the central aorta 99%.  Fick method cardiac output 5.3 L/min with an index of 2.9 L/min/m.  PVR: 2.1 WU  Coronary Diagrams  Diagnostic Dominance: Right     ECHOCARDIOGRAM REPORT         Patient Name:   Joann Barry Date of Exam: 08/24/2019  Medical Rec #:  [id number]      Height:       64.5 in  Accession #:    [id number]     Weight:       161.0 lb  Date of Birth:  [birth year]      BSA:          1.794 m  Patient Age:    65 years       BP:           188/89 mmHg  Patient Gender: F              HR:           59 bpm.  Exam Location:  [facility]   Procedure: 2D Echo, Cardiac Doppler, Color Doppler and Strain Analysis   Indications:    I35.0 Aortic Stenosis     History:        Patient has prior history of Echocardiogram examinations,  most                  recent 02/28/2019. Thoracic aortic aneurysm; Risk                  Factors:Hypertension.     Sonographer:    Marygrace Drought RCS  Referring Phys: [phone] HAO MENG   IMPRESSIONS     1. Left ventricular ejection fraction, by estimation, is 55 to 60%. The  left ventricle has normal function. The left ventricle has no regional  wall motion abnormalities. There is mild left ventricular hypertrophy.  Left ventricular diastolic parameters  are consistent with Grade I diastolic dysfunction (impaired relaxation).  The average left ventricular global longitudinal strain is -16.7 %.   2. Right ventricular systolic function is normal. The right ventricular  size is normal.   3. Left atrial size was mildly dilated.   4. The mitral valve is normal in structure. Mild mitral valve  regurgitation. No evidence of mitral stenosis.   5. The aortic valve is normal in structure. Aortic valve regurgitation is  moderate. Severe aortic valve stenosis. Aortic valve area, by VTI measures  0.51 cm. Aortic valve mean  gradient measures 40.0 mmHg. Aortic valve Vmax  measures 4.07 m/s.   6. Aortic dilatation noted. There is moderate dilatation of the ascending  aorta measuring 44 mm.   7. The inferior vena cava is normal in size with greater than 50%  respiratory variability, suggesting right atrial pressure of 3 mmHg.   Comparison(s): Changes from prior study are noted. Aortic stenosis is  severe when compared to prior.   FINDINGS   Left Ventricle: Left ventricular ejection fraction, by estimation, is 55  to 60%. The left ventricle has normal function. The left ventricle has no  regional wall motion abnormalities. The average left ventricular global  longitudinal strain is -16.7 %.  The left ventricular internal cavity size was normal in size. There is  mild left ventricular hypertrophy. Left ventricular diastolic parameters  are consistent with Grade I diastolic dysfunction (impaired relaxation).   Right Ventricle: The right ventricular size is normal. No increase in  right ventricular wall thickness. Right ventricular systolic function is  normal.   Left Atrium: Left atrial size was mildly dilated.   Right Atrium: Right atrial size was normal in size.   Pericardium: There is no evidence of pericardial effusion.   Mitral Valve: The mitral valve is normal in structure. Normal mobility of  the mitral valve leaflets. Mild mitral valve regurgitation. No evidence of  mitral valve stenosis.   Tricuspid Valve: The tricuspid valve is normal in structure. Tricuspid  valve regurgitation is not demonstrated. No evidence of tricuspid  stenosis.   Aortic Valve: The aortic valve is normal in structure.. There is severe  thickening and severe calcifcation of the aortic valve. Aortic valve  regurgitation is moderate. Aortic regurgitation PHT measures 488 msec.  Severe aortic stenosis is present. There  is severe thickening of the aortic valve. There is severe calcifcation of  the aortic valve. Aortic  valve mean gradient measures 40.0 mmHg. Aortic  valve peak gradient measures 66.3 mmHg. Aortic valve area, by VTI measures  0.51 cm.   Pulmonic Valve: The pulmonic valve was normal in structure. Pulmonic valve  regurgitation is trivial. No evidence of pulmonic stenosis.   Aorta: Aortic dilatation noted. There is moderate dilatation of the  ascending aorta measuring 44 mm.   Venous: The inferior vena cava is normal in size with greater than 50%  respiratory variability, suggesting right atrial pressure of 3 mmHg.   IAS/Shunts: No atrial level shunt detected by color flow Doppler.      LEFT VENTRICLE  PLAX 2D  LVIDd:         4.83 cm  Diastology  LVIDs:         3.53 cm  LV e' lateral:   5.87 cm/s  LV PW:         1.51 cm  LV E/e' lateral: 11.4  LV IVS:        1.31 cm  LV e' medial:    4.46 cm/s  LVOT diam:     2.00 cm  LV E/e' medial:  15.0  LV SV:         60  LV SV Index:   34       2D Longitudinal Strain  LVOT Area:     3.14 cm 2D Strain GLS Avg:     -16.7 %      RIGHT VENTRICLE  RV Basal diam:  2.61 cm  RV S prime:     9.03 cm/s  TAPSE (M-mode): 2.1 cm   LEFT ATRIUM  Index       RIGHT ATRIUM           Index  LA diam:        3.90 cm 2.17 cm/m  RA Area:     15.20 cm  LA Vol (A2C):   78.6 ml 43.82 ml/m RA Volume:   34.70 ml  19.35 ml/m  LA Vol (A4C):   38.2 ml 21.30 ml/m  LA Biplane Vol: 60.2 ml 33.56 ml/m   AORTIC VALVE  AV Area (Vmax):    0.55 cm  AV Area (Vmean):   0.46 cm  AV Area (VTI):     0.51 cm  AV Vmax:           407.00 cm/s  AV Vmean:          300.000 cm/s  AV VTI:            1.190 m  AV Peak Grad:      66.3 mmHg  AV Mean Grad:      40.0 mmHg  LVOT Vmax:         70.90 cm/s  LVOT Vmean:        43.700 cm/s  LVOT VTI:          0.192 m  LVOT/AV VTI ratio: 0.16  AI PHT:            488 msec     AORTA  Ao Root diam: 2.90 cm   MITRAL VALVE  MV Area (PHT):               SHUNTS  MV Decel Time:               Systemic VTI:  0.19 m  MR Peak  grad:    115.3 mmHg  Systemic Diam: 2.00 cm  MR Mean grad:    75.0 mmHg  MR Vmax:         537.00 cm/s  MR Vmean:        401.0 cm/s  MR PISA:         1.01 cm  MR PISA Eff ROA: 6 mm  MR PISA Radius:  0.40 cm  MV E velocity: 66.90 cm/s  MV A velocity: 77.10 cm/s  MV E/A ratio:  0.87   Candee Furbish MD  Electronically signed by Candee Furbish MD  Signature Date/Time: 08/24/2019/11:00:42 AM

## 2019-12-09 LAB — BASIC METABOLIC PANEL
Anion gap: 7 (ref 5–15)
BUN: 12 mg/dL (ref 8–23)
CO2: 28 mmol/L (ref 22–32)
Calcium: 8.6 mg/dL — ABNORMAL LOW (ref 8.9–10.3)
Chloride: 103 mmol/L (ref 98–111)
Creatinine, Ser: 0.68 mg/dL (ref 0.44–1.00)
GFR calc Af Amer: >60 mL/min (ref >60)
GFR calc non Af Amer: >60 mL/min (ref >60)
Glucose, Bld: 113 mg/dL — ABNORMAL HIGH (ref 70–99)
Potassium: 4.2 mmol/L (ref 3.5–5.1)
Sodium: 138 mmol/L (ref 135–145)

## 2019-12-09 MED ORDER — ACETAMINOPHEN 500 MG PO TABS: 1000.0000 mg | ORAL_TABLET | Freq: Four times a day (QID) | ORAL | 0 refills | Status: DC | PRN

## 2019-12-09 MED ORDER — METOPROLOL TARTRATE 25 MG/10 ML ORAL SUSPENSION: 12.5000 mg | Freq: Two times a day (BID) | ORAL | Status: DC

## 2019-12-09 MED ORDER — ASPIRIN 325 MG PO TBEC
325.0000 mg | DELAYED_RELEASE_TABLET | Freq: Every day | ORAL | 0 refills | Status: DC
Start: 2019-12-10 — End: 2020-04-19

## 2019-12-09 MED ORDER — METOPROLOL TARTRATE 25 MG PO TABS
25.0000 mg | ORAL_TABLET | Freq: Two times a day (BID) | ORAL | Status: DC
  Administered 2019-12-09 – 2019-12-10 (×3): 25 mg via ORAL
  Filled 2019-12-09 (×3): qty 1

## 2019-12-09 NOTE — Discharge Summary (Signed)
Physician Discharge Summary  Patient ID: Joann Barry MRN: [id number] DOB/AGE: 1945-08-29 74 y.o.  Admit date: 12/06/2019 Discharge date: 12/10/2019  Admission Diagnoses:  Severe aortic stenosis Ascending aortic aneurysm Hypothyroidism  Discharge Diagnoses:   Severe aortic stenosis Ascending aortic aneurysm Hypertension Hypothyroidism  Discharged Condition: good  History of Present Illness: The patient is a 74 year old active woman with a history of hypertension, hyperlipidemia, and aortic stenosis who was referred for consideration of TAVR versus open AVR.  She said that she was diagnosed with a heart murmur in 2018 by [name] and was referred to Dr. [name] for evaluation. An echocardiogram in 2018 showed a mean gradient of 28 mmHg and a peak gradient of 45 mmHg with a dimensionless index of 0.24. She was asymptomatic at that time. A follow-up echo in 2019 showed an increase in the mean gradient to 34 mmHg with a dimensionless index of 0.19. She said that she continued to feel well. Continued follow-up was recommended. She had a follow-up echocardiogram on 08/24/2019 which showed a further increase in the mean gradient to 40 mmHg consistent with severe aortic stenosis with a dimensionless index of 0.16 and a calculated aortic valve area of 0.5 cm. Left ventricular ejection fraction was 55 to 60%. The ascending aorta was measured at 44 mm and was unchanged from her echo in 2018 was measured at 45 mm. She subsequently underwent cardiac catheterization on 11/11/2019 which showed mildly elevated PA pressures of 38/23. There was no coronary disease. She denies any fatigue or chest discomfort. She has had some exertional shortness of breath with carrying groceries or feeling stressed. She denies any dizziness or syncope. She has had no orthopnea or PND. She denies any peripheral edema. 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 She does report  being tired at the end of the day.   Hospital Course:  [name] was admitted for elective surgery on 12/06/19 and taken to the OR where the aortic valve was replaced with a 72mm Inspiris Reslila bioprosthetic valve and the ascending aortic aneurysm was replaced with a 88mm Hemashield straight graft under hypothermic circulatory arrest. Following the procedure, she separated from cardiopulmonary bypass without difficulty and was transferred to the ICU in stable condition. She was weaned from the ventilator and successfully extubated on the afternoon following surgery.  She was mobilized with physical therapy on the first post-op day and made satisfactory progress.  She was transferred to 4E Progressive Care on the second post-op day. She was diuresed for significant volume overload with good response.  The patient has remained in NSR.  Her pacing wires were removed without difficulty.  She was hypertensive and her lopressor dose was increased.  She has a history of hypothyroidism and was restarted on home dose of synthroid.  Her surgical incisions are healing without evidence of infection.  She is ambulating without difficulty.  She is medically stable for discharge home today.  Consults: None  Significant Diagnostic Studies:   ECHOCARDIOGRAM REPORT       Patient Name:  Joann Barry Date of Exam: 08/24/2019  Medical Rec #: [id number]   Height:    64.5 in  Accession #:  [id number]   Weight:    161.0 lb  Date of Birth: [birth year]   BSA:     1.794 m  Patient Age:  52 years    BP:      188/89 mmHg  Patient Gender: F  HR:      59 bpm.  Exam Location: [facility]   Procedure: 2D Echo, Cardiac Doppler, Color Doppler and Strain Analysis   Indications:  I35.0 Aortic Stenosis    History:    Patient has prior history of Echocardiogram examinations,  most         recent 02/28/2019. Thoracic aortic aneurysm; Risk          Factors:Hypertension.    Sonographer:  Marygrace Drought RCS  Referring Phys: [phone] HAO MENG   IMPRESSIONS    1. Left ventricular ejection fraction, by estimation, is 55 to 60%. The  left ventricle has normal function. The left ventricle has no regional  wall motion abnormalities. There is mild left ventricular hypertrophy.  Left ventricular diastolic parameters  are consistent with Grade I diastolic dysfunction (impaired relaxation).  The average left ventricular global longitudinal strain is -16.7 %.  2. Right ventricular systolic function is normal. The right ventricular  size is normal.  3. Left atrial size was mildly dilated.  4. The mitral valve is normal in structure. Mild mitral valve  regurgitation. No evidence of mitral stenosis.  5. The aortic valve is normal in structure. Aortic valve regurgitation is  moderate. Severe aortic valve stenosis. Aortic valve area, by VTI measures  0.51 cm. Aortic valve mean gradient measures 40.0 mmHg. Aortic valve Vmax  measures 4.07 m/s.  6. Aortic dilatation noted. There is moderate dilatation of the ascending  aorta measuring 44 mm.  7. The inferior vena cava is normal in size with greater than 50%  respiratory variability, suggesting right atrial pressure of 3 mmHg.   Comparison(s): Changes from prior study are noted. Aortic stenosis is  severe when compared to prior.   FINDINGS  Left Ventricle: Left ventricular ejection fraction, by estimation, is 55  to 60%. The left ventricle has normal function. The left ventricle has no  regional wall motion abnormalities. The average left ventricular global  longitudinal strain is -16.7 %.  The left ventricular internal cavity size was normal in size. There is  mild left ventricular hypertrophy. Left ventricular diastolic parameters  are consistent with Grade I diastolic dysfunction (impaired relaxation).   Right Ventricle: The right ventricular size is normal. No increase in    right ventricular wall thickness. Right ventricular systolic function is  normal.   Left Atrium: Left atrial size was mildly dilated.   Right Atrium: Right atrial size was normal in size.   Pericardium: There is no evidence of pericardial effusion.   Mitral Valve: The mitral valve is normal in structure. Normal mobility of  the mitral valve leaflets. Mild mitral valve regurgitation. No evidence of  mitral valve stenosis.   Tricuspid Valve: The tricuspid valve is normal in structure. Tricuspid  valve regurgitation is not demonstrated. No evidence of tricuspid  stenosis.   Aortic Valve: The aortic valve is normal in structure.. There is severe  thickening and severe calcifcation of the aortic valve. Aortic valve  regurgitation is moderate. Aortic regurgitation PHT measures 488 msec.  Severe aortic stenosis is present. There  is severe thickening of the aortic valve. There is severe calcifcation of  the aortic valve. Aortic valve mean gradient measures 40.0 mmHg. Aortic  valve peak gradient measures 66.3 mmHg. Aortic valve area, by VTI measures  0.51 cm.   Pulmonic Valve: The pulmonic valve was normal in structure. Pulmonic valve  regurgitation is trivial. No evidence of pulmonic stenosis.   Aorta: Aortic dilatation noted. There is moderate dilatation of the  ascending aorta measuring 44 mm.   Venous: The inferior vena cava is normal in size with greater than 50%  respiratory variability, suggesting right atrial pressure of 3 mmHg.   IAS/Shunts: No atrial level shunt detected by color flow Doppler.     LEFT VENTRICLE  PLAX 2D  LVIDd:     4.83 cm Diastology  LVIDs:     3.53 cm LV e' lateral:  5.87 cm/s  LV PW:     1.51 cm LV E/e' lateral: 11.4  LV IVS:    1.31 cm LV e' medial:  4.46 cm/s  LVOT diam:   2.00 cm LV E/e' medial: 15.0  LV SV:     60  LV SV Index:  34    2D Longitudinal Strain  LVOT Area:   3.14 cm 2D Strain GLS Avg:    -16.7 %     RIGHT VENTRICLE  RV Basal diam: 2.61 cm  RV S prime:   9.03 cm/s  TAPSE (M-mode): 2.1 cm   LEFT ATRIUM       Index    RIGHT ATRIUM      Index  LA diam:    3.90 cm 2.17 cm/m RA Area:   15.20 cm  LA Vol (A2C):  78.6 ml 43.82 ml/m RA Volume:  34.70 ml 19.35 ml/m  LA Vol (A4C):  38.2 ml 21.30 ml/m  LA Biplane Vol: 60.2 ml 33.56 ml/m  AORTIC VALVE  AV Area (Vmax):  0.55 cm  AV Area (Vmean):  0.46 cm  AV Area (VTI):   0.51 cm  AV Vmax:      407.00 cm/s  AV Vmean:     300.000 cm/s  AV VTI:      1.190 m  AV Peak Grad:   66.3 mmHg  AV Mean Grad:   40.0 mmHg  LVOT Vmax:     70.90 cm/s  LVOT Vmean:    43.700 cm/s  LVOT VTI:     0.192 m  LVOT/AV VTI ratio: 0.16  AI PHT:      488 msec    AORTA  Ao Root diam: 2.90 cm   MITRAL VALVE  MV Area (PHT):        SHUNTS  MV Decel Time:        Systemic VTI: 0.19 m  MR Peak grad:  115.3 mmHg Systemic Diam: 2.00 cm  MR Mean grad:  75.0 mmHg  MR Vmax:     537.00 cm/s  MR Vmean:    401.0 cm/s  MR PISA:     1.01 cm  MR PISA Eff ROA: 6 mm  MR PISA Radius: 0.40 cm  MV E velocity: 66.90 cm/s  MV A velocity: 77.10 cm/s  MV E/A ratio: 0.87   Candee Furbish MD  Electronically signed by Candee Furbish MD  Signature Date/Time: 08/24/2019/11:00:42 AM      RIGHT HEART CATH AND CORONARY ANGIOGRAPHY  Conclusion  Normal to mildly elevated right heart pressures with PA systolic pressure at 38 and mean pressure at 23.  Normal coronary arteries.  Previous echo Doppler determination of severe aortic stenosis with a peak gradient of 66, mean gradient of 40, DI of 0.16 and AVA of 0.5 cm.  RECOMMENDATION:  Patient will return to the structural heart team for further evaluation of TAVR candidacy.   To further evaluate the size of her ascending aorta, she will be scheduled for a gated cardiac CTA as well as a CTA of the chest,  abdomen, and pelvis.  Recommendations  Antiplatelet/Anticoag Patient will undergo evaluation for possible TAVR versus surgical aortic valve replacement.  Indications  Severe aortic stenosis [I35.0 (ICD-10-CM)]  Procedural Details  Technical Details Ms. Sita Mangen is a 74 year old female who has developed progressive aortic valve stenosis.  Her most recent echo Doppler study has shown a peak gradient of 66, mean gradient of 44, aortic valve area of 0.5 cm, dimensionless index of 0.16.  She is felt to have severe D1 aortic stenosis, functional class II.  I referred her to Dr. [name] for consideration of TAVR candidacy.  She also has a mildly dilated aortic root.  She is now referred for definitive right and left heart catheterization with coronary angiography.  The patient arrived to the catheterization laboratory in the fasting state.  She was premedicated with Versed 2 mg and fentanyl 25 mcg.  An IV was sent in the brachial vein.  This was exchanged under sterile technique a 5 French glide sheath slender sheath.  A 5 French right heart catheter was then inserted and pressures were recorded in the right atrium, right ventricle, pulmonary artery, and pulmonary capillary wedge positions.  Oxygen saturation was obtained in the PA.  Patient received an additional 1 mg of Versed and 25 mcg of fentanyl.  Ultrasound guidance was used for right radial access.  Right radial artery was punctured without difficulty and a 6 [place] Glidesheath slender sheath was inserted.  Verapamil 3 mg was administered.  Initially a take catheter was inserted but due to the dilated aortic root this was then removed.  A JR4 catheter was used for selective angiography into the right coronary artery.  A JL4 catheter was used for selective angiography into the left coronary system.  Fluoroscopy revealed a severely calcified aortic valve with reduced excursion.  Since the patient has been documented to have severe aortic stenosis, no  attempt was made to cross the aortic valve.  The patient will undergo CTA imaging of her aorta as part of her TAVR work-up.  All catheters were removed from the patient.  Hemostasis was obtained for the brachial access.  A TR band was inserted for right radial hemostasis.  The patient left the catheterization laboratory chest pain-free with stable hemodynamics. Estimated blood loss <50 mL.   During this procedure medications were administered to achieve and maintain moderate conscious sedation while the patient's heart rate, blood pressure, and oxygen saturation were continuously monitored and I was present face-to-face 100% of this time.  Medications (Filter: Administrations occurring from [phone] to 1052 on 11/11/19) Heparin (Porcine) in NaCl 1000-0.9 UT/500ML-% SOLN (mL) Total volume:  1,000 mL  Date/Time  Rate/Dose/Volume Action  11/11/19 0937  500 mL Given  0938  500 mL Given    midazolam (VERSED) injection (mg) Total dose:  3 mg  Date/Time  Rate/Dose/Volume Action  11/11/19 0940  2 mg Given  1011  1 mg Given    fentaNYL (SUBLIMAZE) injection (mcg) Total dose:  75 mcg  Date/Time  Rate/Dose/Volume Action  11/11/19 0940  50 mcg Given  1011  25 mcg Given    lidocaine (PF) (XYLOCAINE) 1 % injection (mL) Total volume:  4 mL  Date/Time  Rate/Dose/Volume Action  11/11/19 1011  2 mL Given  1020  2 mL Given    Radial Cocktail/Verapamil only (mL) Total volume:  10 mL  Date/Time  Rate/Dose/Volume Action  11/11/19 1026  10 mL Given    heparin sodium (porcine) injection (Units) Total dose:  3,500 Units  Date/Time  Rate/Dose/Volume Action  11/11/19 1029  3,500 Units Given    iohexol (OMNIPAQUE) 350 MG/ML injection (mL) Total volume:  55 mL  Date/Time  Rate/Dose/Volume Action  11/11/19 1047  55 mL Given    Sedation Time  Sedation Time Physician-1: 1 hour 1 minute 44 seconds  Contrast  Medication Name Total Dose  iohexol (OMNIPAQUE) 350 MG/ML injection 55 mL      Radiation/Fluoro  Fluoro time: 7.1 (min) DAP: 10789 (mGycm2) Cumulative Air Kerma: 179 (mGy)  Coronary Findings  Diagnostic Dominance: Right Ramus Intermedius  Vessel is small.  Intervention  No interventions have been documented. Right Heart  Right Heart Pressures RA: A-wave 7, V wave 6, mean 4 RV: 38/6 PA: 38/11; mean 23 PW; A-wave 18;  V wave 17; mean 12  Central aortic pressure 111/47; mean 70  Oxygen saturation in the pulmonary artery 71% and in the central aorta 99%.  Fick method cardiac output 5.3 L/min with an index of 2.9 L/min/m.  PVR: 2.1 WU  Coronary Diagrams  Diagnostic Dominance: Right     Treatments:  CARDIOVASCULAR SURGERY OPERATIVE NOTE   12/06/2019   Surgeon:  Gaye Pollack, MD   First Assistant: Enid Cutter,  PA-C     Preoperative Diagnosis:  Severe bicuspid aortic valve stenosis and 4.5 cm ascending aortic aneurysm.     Postoperative Diagnosis:  Same  Procedure:   1. Median Sternotomy 2. Extracorporeal circulation 3.   Supra-coronary replacement of the ascending aorta ( Hemi-arch) using a 30 mm Hemashield graft under deep hypothermic circulatory arrest 4.   Aortic valve replacement using a 23 mm Edwards INSPIRIS RESILIA pericardial valve.   Anesthesia:  General Endotracheal     Clinical History/Surgical Indication:     This 74 year old active woman has stage D, severe, symptomatic aortic stenosis with New York Heart Association class II symptoms of exertional shortness of breath consistent with chronic diastolic congestive heart failure. I have personally reviewed her 2D echocardiogram, cardiac catheterization, and CTA studies. Her echocardiogram shows a severely calcified aortic valve with restricted mobility and a mean gradient of 40 mmHg consistent with severe aortic stenosis. There is moderate aortic insufficiency. The ascending aorta measured 44 mm. Left ventricular ejection fraction was normal. Cardiac catheterization  showed no coronary disease. Her gated cardiac CTA shows a bicuspid aortic valve with severe aortic stenosis and a 4.5 cm fusiform ascending aortic aneurysm. The aortic root appears unremarkable with a sinus diameter of 39 mm and a well-defined sinotubular junction with a diameter of 31 mm.  I discussed the options for treatment including open surgical aortic valve replacement and replacement of the ascending aortic aneurysm as well as transcatheter aortic valve replacement and continued follow-up of the ascending aortic aneurysm with yearly CT scanning. I discussed the benefits and risk of both approaches. Since she has a bicuspid aortic valve and the ascending aorta is at 4.5 cm I would favor open surgical aortic valve replacement and supra coronary replacement of the ascending aortic aneurysm. I think this would significantly lower the risk of perivalvular leak, structural valve deterioration requiring further intervention, continued follow-up of her aneurysm, acute aortic dissection, and possible need for replacement of her ascending aorta in the future with a TAVR valve in place. I discussed all this with the patient and her husband and they are in agreement with proceeding with open surgery. I recommended using a bioprosthetic valve and they are in agreement with that.  I discussed the operative procedure with the patient and her husband including alternatives, benefits  and risks; including but not limited to bleeding, blood transfusion, infection, stroke, myocardial infarction, graft failure, heart block requiring a permanent pacemaker, organ dysfunction, and death. [name] understands and agrees to proceed.    Discharge Exam: Blood pressure 138/74, pulse 90, temperature 97.9 F (36.6 C), temperature source Oral, resp. rate 19, height 5\' 4"  (1.626 m), weight 70.9 kg, SpO2 100 %.  General appearance: alert, cooperative and no distress Heart: regular rate and rhythm, S1, S2 normal, no murmur,  click, rub or gallop Lungs: clear to auscultation bilaterally Abdomen: soft, non-tender; bowel sounds normal; no masses,  no organomegaly Extremities: extremities normal, atraumatic, no cyanosis or edema Wound: clean and dry    Disposition: Discharge disposition: 01-Home or Self Care       Discharge Instructions    Amb Referral to Cardiac Rehabilitation   Complete by: As directed    To [facility]   Diagnosis: Valve Replacement   Valve: Aortic   After initial evaluation and assessments completed: Virtual Based Care may be provided alone or in conjunction with Phase 2 Cardiac Rehab based on patient barriers.: Yes   Discharge patient   Complete by: As directed    Discharge disposition: 01-Home or Self Care   Discharge patient date: 12/10/2019     Allergies as of 12/10/2019      Reactions   Sulfonamide Derivatives Swelling      Medication List    STOP taking these medications   atenolol 50 MG tablet Commonly known as: TENORMIN     TAKE these medications   acetaminophen 500 MG tablet Commonly known as: TYLENOL Take 2 tablets (1,000 mg total) by mouth every 6 (six) hours as needed for mild pain or fever.   aspirin 325 MG EC tablet Take 1 tablet (325 mg total) by mouth daily.   CENTRUM SILVER 50+WOMEN PO Take 1 tablet by mouth daily.   cholecalciferol 25 MCG (1000 UNIT) tablet Commonly known as: VITAMIN D3 Take 1,000 Units by mouth in the morning and at bedtime.   fluticasone 50 MCG/ACT nasal spray Commonly known as: FLONASE Place 2 sprays into both nostrils daily as needed for allergies.   guaiFENesin 600 MG 12 hr tablet Commonly known as: Mucinex Take 1 tablet (600 mg total) by mouth 2 (two) times daily.   levothyroxine 112 MCG tablet Commonly known as: Synthroid Take 1 tablet (112 mcg total) by mouth daily.   losartan 25 MG tablet Commonly known as: Cozaar Take 1 tablet (25 mg total) by mouth daily. What changed:   medication strength  how much to  take  how to take this  when to take this  additional instructions   meclizine 25 MG tablet Commonly known as: ANTIVERT Take 25 mg by mouth every 8 (eight) hours as needed for dizziness.   metoprolol tartrate 25 MG tablet Commonly known as: LOPRESSOR Take 1 tablet (25 mg total) by mouth 2 (two) times daily.   Repatha SureClick 099 MG/ML Soaj Generic drug: Evolocumab Inject 140 mg into the skin every 14 (fourteen) days.   rosuvastatin 20 MG tablet Commonly known as: CRESTOR Take 1 tablet (20 mg total) by mouth every evening.   traMADol 50 MG tablet Commonly known as: ULTRAM Take 1 tablet (50 mg total) by mouth every 6 (six) hours as needed for moderate pain.   zolpidem 10 MG tablet Commonly known as: AMBIEN Take 5 mg by mouth at bedtime as needed for sleep.       Follow-up Information  [facility]. Go on 12/15/2019.   Specialty: Cardiothoracic Surgery Why: Your appointment for suture removal is Thursday, 12/15/19 at 11:00am. Contact information: [place], [place] [phone]       Darreld Mclean, [place]. Go on 12/21/2019.   Specialties: Physician Assistant, Cardiology Why: Your appointment is at 11:30am Contact information: [address] [place] [address] [phone]        Gaye Pollack, MD. Go on 01/04/2020.   Specialty: Cardiothoracic Surgery Why: Your appointment is on Wednesday, 01/04/20 at 2:30pm. Please arrive 30 minutes early for a chest x-ray to be performed by [facility] located on the first floor of the same building.  Contact information: [address] [place] [address] [phone]               Signed: Elgie Collard 12/10/2019, 9:11 AM

## 2019-12-09 NOTE — Progress Notes (Signed)
Mobility Specialist - Progress Note   12/09/19 1334  Mobility  Activity Ambulated in hall  Level of Assistance Independent  Assistive Device None  Distance Ambulated (ft) 670 ft  Mobility Response Tolerated well  Mobility performed by Mobility specialist  $Mobility charge 1 Mobility    Pre-mobility: 83 HR During mobility: 104 HR Post-mobility: 87 HR  Pt asx throughout ambulation.   Pricilla Handler Mobility Specialist Mobility Specialist Phone: [phone]

## 2019-12-09 NOTE — Progress Notes (Signed)
EPWs removed per order.  All tips intact, Pt tolerated very well.  Noted bruising around ventricular site prior to pulling.  No oozing after.  Pt understands bedrest for one hr with frequent VS.  CCMD notified, will monitor closely.

## 2019-12-09 NOTE — Discharge Instructions (Signed)

## 2019-12-09 NOTE — Progress Notes (Signed)
CARDIAC REHAB PHASE I   PRE:  Rate/Rhythm: 92 SR  BP:  Supine:   Sitting: 141/76     SaO2: 98 RA  MODE:  Ambulation: 470 ft   POST:  Rate/Rhythem: 100 ST  BP:  Supine:   Sitting: 153/78    SaO2: 97 RA  Pt was up walking around room upon arrival. Pt was independent with ambulation with no complaints and vital signs stable. Discussed education on IS use, sternal precautions, importance of exercise, and CRPII. Pt was receptive. Will send CRPII referral to [place]. Gave pt discharge video to watch. Pt can ambulate independently.  [place], MS, CEP 12/09/2019 11:10 AM

## 2019-12-09 NOTE — Progress Notes (Addendum)
      [address]       [place] [address]             [phone]      3 Days Post-Op Procedure(s) (LRB): AORTIC VALVE REPLACEMENT (AVR) (N/A) REPLACEMENT OF ASCENDING AORTIC ANEURYSM (N/A) TRANSESOPHAGEAL ECHOCARDIOGRAM (TEE) (N/A) Subjective: Sitting up on the bedside finishing breakfast. Feels good, no complaints or concerns.  Progressing well with mobility, remains on RA.   Objective: Vital signs in last 24 hours: Temp:  [97.8 F (36.6 C)-99.3 F (37.4 C)] 98.3 F (36.8 C) (09/24 0358) Pulse Rate:  [84-91] 84 (09/24 0358) Cardiac Rhythm: Normal sinus rhythm (09/23 1900) Resp:  [14-18] 16 (09/24 0358) BP: (118-172)/(59-89) 123/59 (09/24 0358) SpO2:  [92 %-97 %] 94 % (09/24 0358) Weight:  [70.9 kg] 70.9 kg (09/24 0437)      Intake/Output from previous day: 09/23 0701 - 09/24 0700 In: 360 [P.O.:360] Out: -  Intake/Output this shift: No intake/output data recorded.  General appearance: alert, cooperative and no distress Neurologic: intact Heart: RRR, no murmur. Monitor shows stable SR.  Lungs: breath sounds are CTA.  Abdomen: soft, NT Extremities: No edema Wound: Sternal dressing removed, incision is intact, slight wateray ooz from top end of the incision was re-dressed.   Lab Results: Recent Labs    12/07/19 1629 12/08/19 0336  WBC 15.5* 8.8  HGB 9.8* 9.3*  HCT 30.1* 28.4*  PLT 115* 93*   BMET:  Recent Labs    12/08/19 0336 12/09/19 0401  NA 138 138  K 4.5 4.2  CL 107 103  CO2 26 28  GLUCOSE 128* 113*  BUN 21 12  CREATININE 0.83 0.68  CALCIUM 8.4* 8.6*    PT/INR:  Recent Labs    12/06/19 1312  LABPROT 18.2*  INR 1.6*   ABG    Component Value Date/Time   PHART 7.305 (L) 12/06/2019 1851   HCO3 21.9 12/06/2019 1851   TCO2 23 12/06/2019 1851   ACIDBASEDEF 4.0 (H) 12/06/2019 1851   O2SAT 97.0 12/06/2019 1851   CBG (last 3)  Recent Labs    12/07/19 2341 12/08/19 0328 12/08/19 1110  GLUCAP 97 117* 121*     Assessment/Plan: S/P Procedure(s) (LRB): AORTIC VALVE REPLACEMENT (AVR) (N/A) REPLACEMENT OF ASCENDING AORTIC ANEURYSM (N/A) TRANSESOPHAGEAL ECHOCARDIOGRAM (TEE) (N/A)  -POD3 aortic valve replacement with a bioprosthetic valve and repair of aneurysmal ascending aorta. Progressing well, stable cardiac rhythm.  Dressings removed, incision OK. Possibly d/c pacer wires today.   -HTN- BP improved with metoprolol but SBP still near 150 this AM. Up-titrate metoprolol to 25mg  BID.   -Expected acute blood loss anemia, mild-monitor.  -Volume excess- good response to diuresis, Wt 1.5 lbs below pre-op.   -Hypothyroidism- Synthroid resumed.   -Disposition- Anticipate discharge 1-2 days.     LOS: 3 days    Malon Kindle [phone] 12/09/2019    Chart reviewed, patient examined, agree with above. She looks good overall and will probably be able to go home tomorrow if no changes. Will remove pacing wires today.

## 2019-12-10 ENCOUNTER — Inpatient Hospital Stay (HOSPITAL_COMMUNITY): Payer: Medicare Other

## 2019-12-10 LAB — BASIC METABOLIC PANEL
Anion gap: 8 (ref 5–15)
BUN: 13 mg/dL (ref 8–23)
CO2: 27 mmol/L (ref 22–32)
Calcium: 8.6 mg/dL — ABNORMAL LOW (ref 8.9–10.3)
Chloride: 102 mmol/L (ref 98–111)
Creatinine, Ser: 0.73 mg/dL (ref 0.44–1.00)
GFR calc Af Amer: >60 mL/min (ref >60)
GFR calc non Af Amer: >60 mL/min (ref >60)
Glucose, Bld: 133 mg/dL — ABNORMAL HIGH (ref 70–99)
Potassium: 4 mmol/L (ref 3.5–5.1)
Sodium: 137 mmol/L (ref 135–145)

## 2019-12-10 LAB — TYPE AND SCREEN
ABO/RH(D): O POS
Antibody Screen: NEGATIVE
Unit division: 0
Unit division: 0
Unit division: 0
Unit division: 0
Unit division: 0
Unit division: 0

## 2019-12-10 LAB — BPAM RBC
Blood Product Expiration Date: 202110242359
Blood Product Expiration Date: 202110242359
Blood Product Expiration Date: 202110242359
Blood Product Expiration Date: 202110242359
Blood Product Expiration Date: 202110242359
Blood Product Expiration Date: 202110242359
ISSUE DATE / TIME: 202109200556
ISSUE DATE / TIME: 202109210813
ISSUE DATE / TIME: 202109210813
ISSUE DATE / TIME: 202109211053
ISSUE DATE / TIME: 202109211053
Unit Type and Rh: 5100
Unit Type and Rh: 5100
Unit Type and Rh: 5100
Unit Type and Rh: 5100
Unit Type and Rh: 5100
Unit Type and Rh: 5100

## 2019-12-10 LAB — CBC
HCT: 30.9 % — ABNORMAL LOW (ref 36.0–46.0)
Hemoglobin: 10.3 g/dL — ABNORMAL LOW (ref 12.0–15.0)
MCH: 30.6 pg (ref 26.0–34.0)
MCHC: 33.3 g/dL (ref 30.0–36.0)
MCV: 91.7 fL (ref 80.0–100.0)
Platelets: 146 10*3/uL — ABNORMAL LOW (ref 150–400)
RBC: 3.37 MIL/uL — ABNORMAL LOW (ref 3.87–5.11)
RDW: 13.2 % (ref 11.5–15.5)
WBC: 9.1 10*3/uL (ref 4.0–10.5)
nRBC: 0 % (ref 0.0–0.2)

## 2019-12-10 MED ORDER — LOSARTAN POTASSIUM 25 MG PO TABS: 25.0000 mg | ORAL_TABLET | Freq: Every day | ORAL | 1 refills | Status: DC

## 2019-12-10 MED ORDER — GUAIFENESIN ER 600 MG PO TB12: 600.0000 mg | ORAL_TABLET | Freq: Two times a day (BID) | ORAL | Status: DC

## 2019-12-10 MED ORDER — METOPROLOL TARTRATE 25 MG PO TABS
25.0000 mg | ORAL_TABLET | Freq: Two times a day (BID) | ORAL | 1 refills | Status: DC
Start: 2019-12-10 — End: 2020-01-20

## 2019-12-10 MED ORDER — TRAMADOL HCL 50 MG PO TABS
50.0000 mg | ORAL_TABLET | Freq: Four times a day (QID) | ORAL | 0 refills | Status: DC | PRN
Start: 2019-12-10 — End: 2020-04-19

## 2019-12-10 NOTE — Plan of Care (Signed)
  Problem: Education: Goal: Knowledge of General Education information will improve Description: Including pain rating scale, medication(s)/side effects and non-pharmacologic comfort measures Outcome: Adequate for Discharge   Problem: Health Behavior/Discharge Planning: Goal: Ability to manage health-related needs will improve Outcome: Adequate for Discharge   Problem: Clinical Measurements: Goal: Ability to maintain clinical measurements within normal limits will improve Outcome: Adequate for Discharge Goal: Will remain free from infection Outcome: Adequate for Discharge Goal: Diagnostic test results will improve Outcome: Adequate for Discharge Goal: Respiratory complications will improve Outcome: Adequate for Discharge Goal: Cardiovascular complication will be avoided Outcome: Adequate for Discharge   Problem: Activity: Goal: Risk for activity intolerance will decrease Outcome: Adequate for Discharge   Problem: Nutrition: Goal: Adequate nutrition will be maintained Outcome: Adequate for Discharge   Problem: Coping: Goal: Level of anxiety will decrease Outcome: Adequate for Discharge   Problem: Elimination: Goal: Will not experience complications related to bowel motility Outcome: Adequate for Discharge Goal: Will not experience complications related to urinary retention Outcome: Adequate for Discharge   Problem: Pain Managment: Goal: General experience of comfort will improve Outcome: Adequate for Discharge   Problem: Safety: Goal: Ability to remain free from injury will improve Outcome: Adequate for Discharge   Problem: Skin Integrity: Goal: Risk for impaired skin integrity will decrease Outcome: Adequate for Discharge   Problem: Education: Goal: Will demonstrate proper wound care and an understanding of methods to prevent future damage Outcome: Adequate for Discharge Goal: Knowledge of disease or condition will improve Outcome: Adequate for Discharge Goal:  Knowledge of the prescribed therapeutic regimen will improve Outcome: Adequate for Discharge Goal: Individualized Educational Video(s) Outcome: Adequate for Discharge   Problem: Activity: Goal: Risk for activity intolerance will decrease Outcome: Adequate for Discharge   Problem: Cardiac: Goal: Will achieve and/or maintain hemodynamic stability Outcome: Adequate for Discharge   Problem: Clinical Measurements: Goal: Postoperative complications will be avoided or minimized Outcome: Adequate for Discharge   Problem: Respiratory: Goal: Respiratory status will improve Outcome: Adequate for Discharge   Problem: Skin Integrity: Goal: Wound healing without signs and symptoms of infection Outcome: Adequate for Discharge Goal: Risk for impaired skin integrity will decrease Outcome: Adequate for Discharge   Problem: Urinary Elimination: Goal: Ability to achieve and maintain adequate renal perfusion and functioning will improve Outcome: Adequate for Discharge   

## 2019-12-10 NOTE — Progress Notes (Signed)
      [address]       [place] [address]             [phone]      4 Days Post-Op Procedure(s) (LRB): AORTIC VALVE REPLACEMENT (AVR) (N/A) REPLACEMENT OF ASCENDING AORTIC ANEURYSM (N/A) TRANSESOPHAGEAL ECHOCARDIOGRAM (TEE) (N/A) Subjective: She feels good this morning and is ready for home. She did have a coughing episode and her chest is a little sore from that.   Objective: Vital signs in last 24 hours: Temp:  [97.9 F (36.6 C)-99.1 F (37.3 C)] 97.9 F (36.6 C) (09/25 0803) Pulse Rate:  [86-100] 90 (09/25 0803) Cardiac Rhythm: Normal sinus rhythm (09/25 0830) Resp:  [15-23] 19 (09/25 0803) BP: (117-148)/(62-96) 138/74 (09/25 0803) SpO2:  [93 %-100 %] 100 % (09/25 0803)     Intake/Output from previous day: 09/24 0701 - 09/25 0700 In: 240 [P.O.:240] Out: -  Intake/Output this shift: Total I/O In: 240 [P.O.:240] Out: -   General appearance: alert, cooperative and no distress Heart: regular rate and rhythm, S1, S2 normal, no murmur, click, rub or gallop Lungs: clear to auscultation bilaterally Abdomen: soft, non-tender; bowel sounds normal; no masses,  no organomegaly Extremities: extremities normal, atraumatic, no cyanosis or edema Wound: clean and dry  Lab Results: Recent Labs    12/08/19 0336 12/10/19 0204  WBC 8.8 9.1  HGB 9.3* 10.3*  HCT 28.4* 30.9*  PLT 93* 146*   BMET:  Recent Labs    12/09/19 0401 12/10/19 0204  NA 138 137  K 4.2 4.0  CL 103 102  CO2 28 27  GLUCOSE 113* 133*  BUN 12 13  CREATININE 0.68 0.73  CALCIUM 8.6* 8.6*    PT/INR: No results for input(s): LABPROT, INR in the last 72 hours. ABG    Component Value Date/Time   PHART 7.305 (L) 12/06/2019 1851   HCO3 21.9 12/06/2019 1851   TCO2 23 12/06/2019 1851   ACIDBASEDEF 4.0 (H) 12/06/2019 1851   O2SAT 97.0 12/06/2019 1851   CBG (last 3)  Recent Labs    12/07/19 2341 12/08/19 0328 12/08/19 1110  GLUCAP 97 117* 121*    Assessment/Plan: S/P  Procedure(s) (LRB): AORTIC VALVE REPLACEMENT (AVR) (N/A) REPLACEMENT OF ASCENDING AORTIC ANEURYSM (N/A) TRANSESOPHAGEAL ECHOCARDIOGRAM (TEE) (N/A)  1. CV-NSR in the 90s, BP well controlled. Continue asa, lopressor and statin 2. Pulm- tolerating room air with 100% oxygen saturation. CXR with small bilateral pleural effusions.  3. Renal- creatinine 0.73, electrolytes okay 4. Endo- blood glucose has been well controlled 5. H and H stable  Plan: Discharge today. Instructions have been discussed.    LOS: 4 days    Elgie Collard 12/10/2019

## 2019-12-10 NOTE — Plan of Care (Signed)
  Problem: Education: Goal: Knowledge of General Education information will improve Description Including pain rating scale, medication(s)/side effects and non-pharmacologic comfort measures Outcome: Progressing   

## 2019-12-10 NOTE — Progress Notes (Signed)
CARDIAC REHAB PHASE I   Offered to walk with pt, pt states d/c today. Reinforced d/c ed. Pt denies further questions or concerns. Pt referred to [facility] [place].   [id number] Rufina Falco, RN BSN 12/10/2019 9:49 AM

## 2019-12-16 ENCOUNTER — Telehealth (HOSPITAL_COMMUNITY): Payer: Self-pay

## 2019-12-16 NOTE — Telephone Encounter (Signed)
Faxed cardiac rehab referral to [facility]. 

## 2019-12-19 ENCOUNTER — Telehealth: Payer: Self-pay | Admitting: *Deleted

## 2019-12-19 NOTE — Telephone Encounter (Signed)
Joann Barry called regarding an elevated HR. Per pt her HR trends this morning were 113, 110, and last taken HR 102. When asked if she was consuming enough water throughout the day pt stated she was not drinking as much as she use to. Pt stated she was taking her medications as prescribed. Per pt, her weights were stable. RN advised pt to stay hydrated during the day. All questions answered.

## 2019-12-20 ENCOUNTER — Other Ambulatory Visit: Payer: Self-pay

## 2019-12-20 ENCOUNTER — Encounter: Payer: Self-pay | Admitting: *Deleted

## 2019-12-20 ENCOUNTER — Ambulatory Visit (INDEPENDENT_AMBULATORY_CARE_PROVIDER_SITE_OTHER): Payer: Self-pay | Admitting: *Deleted

## 2019-12-20 VITALS — BP 169/87 | HR 102

## 2019-12-20 DIAGNOSIS — Z4802 Encounter for removal of sutures: Secondary | ICD-10-CM

## 2019-12-20 NOTE — Progress Notes (Signed)
Patient arrived for nurse visit to remove sutures post-AVR, replacement of ascending aorta.  Sutures removed with no signs or symptoms of infection noted.  Patient tolerated suture removal well.  Patient and family instructed to keep the incision site clean and dry. Patient and family acknowledged instructions given.  All questions answered.

## 2019-12-25 NOTE — Progress Notes (Signed)
Cardiology Office Note   Date:  12/26/2019   ID:  Joann, Barry 1945-05-16, MRN [id number]  PCP:  [name], MD Cardiologist:  [name], MD 11/11/2019 in-hospital  Structural Heart: Sherren Mocha, MD Electrphysiologist: None  Rosaria Ferries, PA-C   No chief complaint on file.   History of Present Illness: Joann Barry is a 74 y.o. female with a history of HTN, HLD, GERD, anxiety, severe AS s/p AVR 12/06/2019 w/ 23 INSPIRIS RESILIA BIOPROSTHETIC VALVE & repair of ascending Aortic aneurysm w/ hemashield graft, thyroid nodule s/p thyroidectomy, OA  D/c after AVR 12/10/2019  [name] presents for cardiology follow up.  She has done pretty well since d/c.   Home BP has been fine, range 113/76 - 137/75, HR has run 82-109. No fevers or chills.   She has had some orthostatic dizziness. She has been nauseated at times, with activity.   She has been walking as she is able. She is not timing herself, but is not getting chest pain or SOB w/ exertion. No dizziness or presyncope.   No LE edema, no orthopnea or PND.   No palpitations  She is compliant w/ meds. Has not had to take any pain meds. Incisions are healing well.    Past Medical History:  Diagnosis Date  . Anxiety   . Aortic stenosis    documented on cardiac clearance note  . Arthritis   . Elevated lipoprotein A level 04/19/2019  . Heart murmur   . Hypercholesterolemia   . Hypertension   . Personal history of colonic polyps   . S/P thyroidectomy 03/30/2019  . Thyroid nodule    Bilateral    Past Surgical History:  Procedure Laterality Date  . AORTIC VALVE REPLACEMENT N/A 12/06/2019   Procedure: AORTIC VALVE REPLACEMENT (AVR);  Surgeon: Gaye Pollack, MD;  Location: [facility];  Service: Open Heart Surgery;  Laterality: N/A;  . COLONOSCOPY  01/18/2014  . PARTIAL HYSTERECTOMY  1980  . POLYPECTOMY    . REPLACEMENT ASCENDING AORTA N/A 12/06/2019   Procedure: REPLACEMENT OF ASCENDING AORTIC  ANEURYSM;  Surgeon: Gaye Pollack, MD;  Location: [facility];  Service: Open Heart Surgery;  Laterality: N/A;  CIRC ARREST  . RIGHT HEART CATH AND CORONARY ANGIOGRAPHY N/A 11/11/2019   Procedure: RIGHT HEART CATH AND CORONARY ANGIOGRAPHY;  Surgeon: [name], MD;  Location: [facility];  Service: Cardiovascular;  Laterality: N/A;  . TEE WITHOUT CARDIOVERSION N/A 12/06/2019   Procedure: TRANSESOPHAGEAL ECHOCARDIOGRAM (TEE);  Surgeon: Gaye Pollack, MD;  Location: [facility];  Service: Open Heart Surgery;  Laterality: N/A;  . THYROIDECTOMY  03/30/2019  . THYROIDECTOMY N/A 03/30/2019   Procedure: TOTAL THYROIDECTOMY;  Surgeon: [name], MD;  Location: [facility];  Service: General;  Laterality: N/A;    Current Outpatient Medications  Medication Sig Dispense Refill  . acetaminophen (TYLENOL) 500 MG tablet Take 2 tablets (1,000 mg total) by mouth every 6 (six) hours as needed for mild pain or fever. 30 tablet 0  . aspirin EC 325 MG EC tablet Take 1 tablet (325 mg total) by mouth daily. 30 tablet 0  . cholecalciferol (VITAMIN D3) 25 MCG (1000 UNIT) tablet Take 1,000 Units by mouth in the morning and at bedtime.     . Evolocumab (REPATHA SURECLICK) 270 MG/ML SOAJ Inject 140 mg into the skin every 14 (fourteen) days. 2 pen 11  . fluticasone (FLONASE) 50 MCG/ACT nasal spray Place 2 sprays into both nostrils daily  as needed for allergies.     Marland Kitchen guaiFENesin (MUCINEX) 600 MG 12 hr tablet Take 1 tablet (600 mg total) by mouth 2 (two) times daily.    Marland Kitchen levothyroxine (SYNTHROID) 112 MCG tablet Take 1 tablet (112 mcg total) by mouth daily. 30 tablet 11  . losartan (COZAAR) 25 MG tablet Take 1 tablet (25 mg total) by mouth daily. 30 tablet 1  . meclizine (ANTIVERT) 25 MG tablet Take 25 mg by mouth every 8 (eight) hours as needed for dizziness.     . metoprolol tartrate (LOPRESSOR) 25 MG tablet Take 1 tablet (25 mg total) by mouth 2 (two) times daily. 60 tablet 1  . Multiple Vitamins-Minerals (CENTRUM  SILVER 50+WOMEN PO) Take 1 tablet by mouth daily.     . rosuvastatin (CRESTOR) 20 MG tablet Take 1 tablet (20 mg total) by mouth every evening. 90 tablet 3  . traMADol (ULTRAM) 50 MG tablet Take 1 tablet (50 mg total) by mouth every 6 (six) hours as needed for moderate pain. 30 tablet 0  . zolpidem (AMBIEN) 10 MG tablet Take 5 mg by mouth at bedtime as needed for sleep.      No current facility-administered medications for this visit.    Allergies:   Sulfonamide derivatives    Social History:  The patient  reports that she has never smoked. She has never used smokeless tobacco. She reports that she does not drink alcohol and does not use drugs.   Family History:  The patient's family history includes Pancreatic cancer in her mother.  She indicated that her mother is deceased. She indicated that her father is deceased. She indicated that the status of her neg hx is unknown.    ROS:  Please see the history of present illness. All other systems are reviewed and negative.    PHYSICAL EXAM: VS:  BP (!) 168/82   Pulse 98   Ht 5\' 4"  (1.626 m)   Wt 155 lb 12.8 oz (70.7 kg)   SpO2 98%   BMI 26.74 kg/m  , BMI Body mass index is 26.74 kg/m. GEN: Well nourished, well developed, female in no acute distress HEENT: normal for age  Neck: no JVD, no carotid bruit, no masses Cardiac: RRR; soft murmur, no rubs, or gallops Respiratory:  clear to auscultation bilaterally, normal work of breathing GI: soft, nontender, nondistended, + BS MS: no deformity or atrophy; no edema; distal pulses are 2+ in all 4 extremities  Skin: warm and dry, no rash Neuro:  Strength and sensation are intact Psych: euthymic mood, full affect   EKG:  EKG is ordered today. The ekg ordered today demonstrates SR, HR 84, T wave inversions lateral and inferior leads, different from 09/22 ECG, possibly post-op changes  ECHO: 08/24/2019 1. Left ventricular ejection fraction, by estimation, is 55 to 60%. The  left  ventricle has normal function. The left ventricle has no regional  wall motion abnormalities. There is mild left ventricular hypertrophy.  Left ventricular diastolic parameters  are consistent with Grade I diastolic dysfunction (impaired relaxation).  The average left ventricular global longitudinal strain is -16.7 %.  2. Right ventricular systolic function is normal. The right ventricular  size is normal.  3. Left atrial size was mildly dilated.  4. The mitral valve is normal in structure. Mild mitral valve  regurgitation. No evidence of mitral stenosis.  5. The aortic valve is normal in structure. Aortic valve regurgitation is  moderate. Severe aortic valve stenosis. Aortic valve area, by VTI  measures  0.51 cm. Aortic valve mean gradient measures 40.0 mmHg. Aortic valve Vmax  measures 4.07 m/s.  6. Aortic dilatation noted. There is moderate dilatation of the ascending  aorta measuring 44 mm.  7. The inferior vena cava is normal in size with greater than 50%  respiratory variability, suggesting right atrial pressure of 3 mmHg.   Comparison(s): Changes from prior study are noted. Aortic stenosis is  severe when compared to prior.   TEE: 12/06/2019 POST-OP IMPRESSIONS  - Left Ventricle: The left ventricle is unchanged from pre-bypass.  - Aorta: A graft was placed in the ascending aorta for repair.Hemashield  Platinum 30 in proper position.  - Left Atrial Appendage: The left atrial appendage appears unchanged from  pre-bypass.  - Aortic Valve: No stenosis present. A bovine bioprosthetic valve was  placed,  leaflets are freely mobile Manufactured by; Inspiris Size; 43mm. There is  no  regurgitation. The gradient recorded across the prosthetic valve is within  the  expected range.  - Mitral Valve: The mitral valve appears unchanged from pre-bypass.  - Tricuspid Valve: The tricuspid valve appears unchanged from pre-bypass.  - Interatrial Septum: The interatrial septum appears  unchanged from  pre-bypass.  - Pericardium: The pericardium appears unchanged from pre-bypass.  S/P AVR with size 23 Inspiris bioprosthetic. Valve seated appropriately  without  rock or paravalvular leak. Max/mean PG 11/56mmHg down from 63/76mmHg  pre-operative with estimated AVA 0.67cm2. S/P size 30 Hemashield Platinum  graft  to ascending aorta in appropriate position with max diameter measured  3.22cm  (down from 4.5cm pre-operative).   CATH: 11/11/2019 Normal to mildly elevated right heart pressures with PA systolic pressure at 38 and mean pressure at 23.  Normal coronary arteries.  Previous echo Doppler determination of severe aortic stenosis with a peak gradient of 66, mean gradient of 40, DI of 0.16 and AVA of 0.5 cm.  RECOMMENDATION:  Patient will return to the structural heart team for further evaluation of TAVR candidacy.   To further evaluate the size of her ascending aorta, she will be scheduled for a gated cardiac CTA as well as a CTA of the chest, abdomen, and pelvis.   Recent Labs: 10/13/2019: TSH 0.025 12/02/2019: ALT 20 12/07/2019: Magnesium 2.4 12/10/2019: BUN 13; Creatinine, Ser 0.73; Hemoglobin 10.3; Platelets 146; Potassium 4.0; Sodium 137  CBC    Component Value Date/Time   WBC 9.1 12/10/2019 0204   RBC 3.37 (L) 12/10/2019 0204   HGB 10.3 (L) 12/10/2019 0204   HGB 12.0 10/13/2019 0858   HCT 30.9 (L) 12/10/2019 0204   HCT 35.8 10/13/2019 0858   PLT 146 (L) 12/10/2019 0204   PLT 218 10/13/2019 0858   MCV 91.7 12/10/2019 0204   MCV 89 10/13/2019 0858   MCH 30.6 12/10/2019 0204   MCHC 33.3 12/10/2019 0204   RDW 13.2 12/10/2019 0204   RDW 12.8 10/13/2019 0858   CMP Latest Ref Rng & Units 12/10/2019 12/09/2019 12/08/2019  Glucose 70 - 99 mg/dL 133(H) 113(H) 128(H)  BUN 8 - 23 mg/dL 13 12 21   Creatinine 0.44 - 1.00 mg/dL 0.73 0.68 0.83  Sodium 135 - 145 mmol/L 137 138 138  Potassium 3.5 - 5.1 mmol/L 4.0 4.2 4.5  Chloride 98 - 111 mmol/L 102 103 107  CO2  22 - 32 mmol/L 27 28 26   Calcium 8.9 - 10.3 mg/dL 8.6(L) 8.6(L) 8.4(L)  Total Protein 6.5 - 8.1 g/dL - - -  Total Bilirubin 0.3 - 1.2 mg/dL - - -  Alkaline  Phos 38 - 126 U/L - - -  AST 15 - 41 U/L - - -  ALT 0 - 44 U/L - - -     Lipid Panel Lab Results  Component Value Date   CHOL 94 (L) 10/13/2019   HDL 49 10/13/2019   LDLCALC 29 10/13/2019   TRIG 78 10/13/2019   CHOLHDL 1.9 10/13/2019      Wt Readings from Last 3 Encounters:  12/26/19 155 lb 12.8 oz (70.7 kg)  12/09/19 156 lb 4.9 oz (70.9 kg)  12/02/19 (P) 157 lb 12.8 oz (71.6 kg)     Other studies Reviewed: Additional studies/ records that were reviewed today include: Office notes, hospital records and testing.  ASSESSMENT AND PLAN:  1.  S/p AVR w/ bioprosthetic valve - no S&S of infection, she is recovering very well  - encouraged her to increase activity per rehab guidelines and do outpt rehab as scheduled - f/u w/ [facility] as scheduled  2. Tachycardia - unclear why her HR is tachycardic w/ minimal activity - ECG is SR - no other sx - will go ahead and recheck echo to r/o effusion - if this is ok, no further eval, encourage hydration   Current medicines are reviewed at length with the patient today.  The patient does not have concerns regarding medicines.  The following changes have been made:  no change  Labs/ tests ordered today include:   Orders Placed This Encounter  Procedures  . EKG 12-Lead  . ECHOCARDIOGRAM COMPLETE   Disposition:   FU with [name], MD  Signed, Rosaria Ferries, PA-C  12/26/2019 3:55 PM    [place] Phone: [phone]; Fax: [phone]

## 2019-12-26 ENCOUNTER — Ambulatory Visit: Payer: Medicare Other | Admitting: Physician Assistant

## 2019-12-26 ENCOUNTER — Other Ambulatory Visit: Payer: Self-pay

## 2019-12-26 ENCOUNTER — Encounter: Payer: Self-pay | Admitting: Physician Assistant

## 2019-12-26 VITALS — BP 168/82 | HR 98 | Ht 64.0 in | Wt 155.8 lb

## 2019-12-26 DIAGNOSIS — Z953 Presence of xenogenic heart valve: Secondary | ICD-10-CM | POA: Diagnosis not present

## 2019-12-26 DIAGNOSIS — I479 Paroxysmal tachycardia, unspecified: Secondary | ICD-10-CM

## 2019-12-26 NOTE — Patient Instructions (Addendum)
Medication Instructions:  No changes *If you need a refill on your cardiac medications before your next appointment, please call your pharmacy*   Lab Work: No Labs If you have labs (blood work) drawn today and your tests are completely normal, you will receive your results only by: Marland Kitchen MyChart Message (if you have MyChart) OR . A paper copy in the mail If you have any lab test that is abnormal or we need to change your treatment, we will call you to review the results.   Testing/Procedures: Location for this Test [address], [place] has requested that you have an echocardiogram. Echocardiography is a painless test that uses sound waves to create images of your heart. It provides your doctor with information about the size and shape of your heart and how well your heart's chambers and valves are working. This procedure takes approximately one hour. There are no restrictions for this procedure.    Follow-Up: At [facility], you and your health needs are our priority.  As part of our continuing mission to provide you with exceptional heart care, we have created designated Provider Care Teams.  These Care Teams include your primary Cardiologist (physician) and Advanced Practice Providers (APPs -  Physician Assistants and Nurse Practitioners) who all work together to provide you with the care you need, when you need it.  We recommend signing up for the patient portal called "MyChart".  Sign up information is provided on this After Visit Summary.  MyChart is used to connect with patients for Virtual Visits (Telemedicine).  Patients are able to view lab/test results, encounter notes, upcoming appointments, etc.  Non-urgent messages can be sent to your provider as well.   To learn more about what you can do with MyChart, go to [email or web address].    Your next appointment:   3 month(s)  The format for your next appointment:   In Person  Provider:   [name], MD   Other Instructions Monitor BP Daily Time can vary. If BP increases 130/80 increase Metoprolol.

## 2019-12-29 ENCOUNTER — Other Ambulatory Visit: Payer: Self-pay | Admitting: Surgery

## 2019-12-29 DIAGNOSIS — Z953 Presence of xenogenic heart valve: Secondary | ICD-10-CM

## 2019-12-30 ENCOUNTER — Telehealth: Payer: Self-pay | Admitting: *Deleted

## 2019-12-30 NOTE — Telephone Encounter (Signed)
Joann Barry called with concerns of an increased HR stating this morning when she woke up it was 113. Upon rechecking it came down to 89. Pt reports seeing cardiology this week & they did an EKG. Pt states everything was fine with her EKG. Pt reports the increased HR may come from anxiety. She questioned whether or not her Metropolol may need to be changed in which I advised her to reach out to cardiology to address that change. Pt reports she will reach out to her cardiologist. No other concerns at this time.

## 2020-01-04 ENCOUNTER — Encounter: Payer: Self-pay | Admitting: Surgery

## 2020-01-04 ENCOUNTER — Ambulatory Visit (INDEPENDENT_AMBULATORY_CARE_PROVIDER_SITE_OTHER): Payer: Self-pay | Admitting: Surgery

## 2020-01-04 ENCOUNTER — Other Ambulatory Visit: Payer: Self-pay

## 2020-01-04 ENCOUNTER — Ambulatory Visit
Admission: RE | Admit: 2020-01-04 | Discharge: 2020-01-04 | Disposition: A | Discharge status: Home / Self Care | Payer: Medicare Other | Source: Ambulatory Visit | Attending: Surgery | Admitting: Surgery

## 2020-01-04 VITALS — BP 180/90 | HR 100 | Temp 97.7°F | Resp 20 | Ht 64.0 in | Wt 158.0 lb

## 2020-01-04 DIAGNOSIS — Z953 Presence of xenogenic heart valve: Secondary | ICD-10-CM

## 2020-01-04 DIAGNOSIS — I350 Nonrheumatic aortic (valve) stenosis: Secondary | ICD-10-CM

## 2020-01-04 DIAGNOSIS — I712 Thoracic aortic aneurysm, without rupture, unspecified: Secondary | ICD-10-CM

## 2020-01-04 NOTE — Progress Notes (Signed)
HPI: Patient returns for routine postoperative follow-up having undergone aortic valve replacement using a 23 mm Edwards pericardial valve and supra-coronary replacement of an ascending aortic aneurysm on 12/06/2019. The patient's early postoperative recovery while in the hospital was notable for an uncomplicated postoperative course. Since hospital discharge the patient reports that she has been feeling well.  She is walking daily without chest pain or shortness of breath.  Her blood pressure is elevated today but she has been checking her pressure at home and so that is usually much lower, around 130/80.   Current Outpatient Medications  Medication Sig Dispense Refill   acetaminophen (TYLENOL) 500 MG tablet Take 2 tablets (1,000 mg total) by mouth every 6 (six) hours as needed for mild pain or fever. 30 tablet 0   aspirin EC 325 MG EC tablet Take 1 tablet (325 mg total) by mouth daily. 30 tablet 0   cholecalciferol (VITAMIN D3) 25 MCG (1000 UNIT) tablet Take 1,000 Units by mouth in the morning and at bedtime.      Evolocumab (REPATHA SURECLICK) 620 MG/ML SOAJ Inject 140 mg into the skin every 14 (fourteen) days. 2 pen 11   fluticasone (FLONASE) 50 MCG/ACT nasal spray Place 2 sprays into both nostrils daily as needed for allergies.      guaiFENesin (MUCINEX) 600 MG 12 hr tablet Take 1 tablet (600 mg total) by mouth 2 (two) times daily.     levothyroxine (SYNTHROID) 112 MCG tablet Take 1 tablet (112 mcg total) by mouth daily. 30 tablet 11   losartan (COZAAR) 25 MG tablet Take 1 tablet (25 mg total) by mouth daily. 30 tablet 1   meclizine (ANTIVERT) 25 MG tablet Take 25 mg by mouth every 8 (eight) hours as needed for dizziness.      metoprolol tartrate (LOPRESSOR) 25 MG tablet Take 1 tablet (25 mg total) by mouth 2 (two) times daily. 60 tablet 1   Multiple Vitamins-Minerals (CENTRUM SILVER 50+WOMEN PO) Take 1 tablet by mouth daily.      rosuvastatin (CRESTOR) 20 MG tablet Take 1  tablet (20 mg total) by mouth every evening. 90 tablet 3   traMADol (ULTRAM) 50 MG tablet Take 1 tablet (50 mg total) by mouth every 6 (six) hours as needed for moderate pain. 30 tablet 0   zolpidem (AMBIEN) 10 MG tablet Take 5 mg by mouth at bedtime as needed for sleep.      No current facility-administered medications for this visit.    Physical Exam: BP (!) 180/90    Pulse 100    Temp 97.7 F (36.5 C) (Skin)    Resp 20    Ht 5\' 4"  (1.626 m)    Wt 158 lb (71.7 kg)    SpO2 98% Comment: RA   BMI 27.12 kg/m  She looks well. Cardiac exam shows regular rate and rhythm with normal heart sounds.  There is no murmur. Lungs are clear. The chest incision is healing well and the sternum is stable. There is no peripheral edema  Diagnostic Tests:  CLINICAL DATA:  Status post aortic valve repair.  EXAM: CHEST - 2 VIEW  COMPARISON:  December 10, 2019.  FINDINGS: The heart size and mediastinal contours are within normal limits. Both lungs are clear. No pneumothorax or pleural effusion is noted. The visualized skeletal structures are unremarkable.  IMPRESSION: No active cardiopulmonary disease.   Electronically Signed   By: Marijo Conception M.D.   On: 01/04/2020 14:38   Impression:  Overall Joann Barry is making a very good recovery following her surgery.  Her blood pressure is significantly elevated today and her heart rate is 100.  I asked her to increase her Lopressor to 50 mg twice daily and to keep a blood pressure log at home.  I told her that she can return to driving a car but should refrain lifting anything heavier than 10 pounds for 3 months postoperatively.  I told her she could begin cardiac rehab.  Plan:  She has a follow-up appointment next week with cardiology and I will plan to see her back in 1 month.   Joann Pollack, MD [facility] [phone]

## 2020-01-12 ENCOUNTER — Telehealth: Payer: Self-pay | Admitting: Cardiovascular Disease

## 2020-01-12 NOTE — Telephone Encounter (Signed)
Returned call to pt.  Advised per [facility] Pt should call their office to discuss with their nurse.  Pt has number.  She will call.

## 2020-01-12 NOTE — Telephone Encounter (Signed)
New Message:     Pt had open heart surgery on 12-06-19. She wants to know when can she go to the dentist to have her teeth cleaned and xrays?

## 2020-01-16 ENCOUNTER — Telehealth: Payer: Self-pay | Admitting: Cardiovascular Disease

## 2020-01-16 ENCOUNTER — Other Ambulatory Visit: Payer: Self-pay

## 2020-01-16 ENCOUNTER — Telehealth: Payer: Self-pay

## 2020-01-16 ENCOUNTER — Ambulatory Visit (HOSPITAL_COMMUNITY): Payer: Medicare Other | Attending: Cardiology

## 2020-01-16 DIAGNOSIS — I350 Nonrheumatic aortic (valve) stenosis: Secondary | ICD-10-CM | POA: Diagnosis present

## 2020-01-16 DIAGNOSIS — Z953 Presence of xenogenic heart valve: Secondary | ICD-10-CM | POA: Insufficient documentation

## 2020-01-16 DIAGNOSIS — I712 Thoracic aortic aneurysm, without rupture, unspecified: Secondary | ICD-10-CM

## 2020-01-16 LAB — ECHOCARDIOGRAM COMPLETE
AR max vel: 1.47 cm2
AV Area VTI: 1.47 cm2
AV Area mean vel: 1.4 cm2
AV Mean grad: 7 mmHg
AV Peak grad: 12.7 mmHg
Ao pk vel: 1.78 m/s
Area-P 1/2: 3.08 cm2
S' Lateral: 3.3 cm

## 2020-01-16 NOTE — Telephone Encounter (Signed)
Left message for pt to call us back. 

## 2020-01-16 NOTE — Telephone Encounter (Signed)
New message:      Patient to ask some questions concering some medication. Please call back.

## 2020-01-16 NOTE — Telephone Encounter (Signed)
Pt is s/p Aortic Valve Replacement 12/06/19 and asking if she needs to continue with the ASA 325 mg.. will need to forward to Dr. [name] for review.. pt is not following up until 04/2020.

## 2020-01-16 NOTE — Telephone Encounter (Signed)
° ° °  Ppt is returning call, she would like to know if she needs to know if she needs to continue taking her aspirin 325 mg. She said to call her mobile#

## 2020-01-16 NOTE — Telephone Encounter (Signed)
Patient contacted the office questioning whether she should continue her 325 mg Asprin or decrease to 81 mg.  Advised that should should contact her Cardiologist, Dr. [name] for instructions.  She acknowledged receipt.

## 2020-01-16 NOTE — Telephone Encounter (Signed)
Have pt discuss with Dr. Cyndia Bent at next office visit; whn dose can be reduced to 81 mg ince she was placed on the dose post surgery.

## 2020-01-17 ENCOUNTER — Telehealth: Payer: Self-pay | Admitting: *Deleted

## 2020-01-17 NOTE — Telephone Encounter (Signed)
Left message for patient to call back  

## 2020-01-17 NOTE — Telephone Encounter (Signed)
Conveyed Dr. Evette Georges advice that pt ask Dr. Cyndia Bent about the aspirin. Pt verbalizes understanding.

## 2020-01-17 NOTE — Telephone Encounter (Signed)
Ms. Schum called with concerns regarding the amount of Aspirin she is taking. Pt states she is currently taking 325mg  as prescribed. Per Dr. Vivi Martens recommendations, pt is advised that it is best to continue taking the 325mg  but may drop down to 81mg  if she is experiencing uncomfortable GI symptoms. Pt acknowledges & states she will continue taking 325mg  as directed. All questions answered.

## 2020-01-19 ENCOUNTER — Other Ambulatory Visit: Payer: Self-pay | Admitting: Physician Assistant

## 2020-01-20 ENCOUNTER — Other Ambulatory Visit: Payer: Self-pay | Admitting: *Deleted

## 2020-01-20 MED ORDER — METOPROLOL TARTRATE 50 MG PO TABS: 50.0000 mg | ORAL_TABLET | Freq: Two times a day (BID) | ORAL | 1 refills | Status: AC

## 2020-01-20 NOTE — Progress Notes (Signed)
Pt called stating she was needing a refill of her Metoprolol. Per pt and verified with Dr. Vivi Martens recent office visit note, pt is to increase her Metoprolol to 50mg  BID. Per pt, her blood pressure this morning was 148/72. Order sent in to pt's preferred pharmacy for refill. Dr. Cyndia Bent notified.

## 2020-01-23 ENCOUNTER — Other Ambulatory Visit: Payer: Self-pay | Admitting: Physician Assistant

## 2020-01-24 ENCOUNTER — Other Ambulatory Visit (HOSPITAL_COMMUNITY): Payer: Medicare Other

## 2020-02-08 ENCOUNTER — Other Ambulatory Visit: Payer: Self-pay

## 2020-02-08 ENCOUNTER — Ambulatory Visit (INDEPENDENT_AMBULATORY_CARE_PROVIDER_SITE_OTHER): Payer: Self-pay | Admitting: Surgery

## 2020-02-08 ENCOUNTER — Encounter: Payer: Self-pay | Admitting: Surgery

## 2020-02-08 VITALS — BP 170/88 | HR 76 | Temp 97.7°F | Resp 20 | Ht 64.0 in | Wt 158.0 lb

## 2020-02-08 DIAGNOSIS — I712 Thoracic aortic aneurysm, without rupture, unspecified: Secondary | ICD-10-CM

## 2020-02-08 DIAGNOSIS — Z953 Presence of xenogenic heart valve: Secondary | ICD-10-CM

## 2020-02-08 DIAGNOSIS — I350 Nonrheumatic aortic (valve) stenosis: Secondary | ICD-10-CM

## 2020-02-08 NOTE — Progress Notes (Signed)
HPI:  Joann Barry returns for routine postoperative follow-up having undergone aortic valve replacement using a 23 mm Edwards pericardial valve and supra-coronary replacement of an ascending aortic aneurysm on 12/06/2019.  She has continued to do well postoperatively and is walking daily without chest pain or shortness of breath.  She said that she did start doing cardiac rehab but has to pay $35 every time she goes which is difficult to afford.   Current Outpatient Medications  Medication Sig Dispense Refill  . acetaminophen (TYLENOL) 500 MG tablet Take 2 tablets (1,000 mg total) by mouth every 6 (six) hours as needed for mild pain or fever. 30 tablet 0  . aspirin EC 325 MG EC tablet Take 1 tablet (325 mg total) by mouth daily. 30 tablet 0  . cholecalciferol (VITAMIN D3) 25 MCG (1000 UNIT) tablet Take 1,000 Units by mouth in the morning and at bedtime.     . Evolocumab (REPATHA SURECLICK) 086 MG/ML SOAJ Inject 140 mg into the skin every 14 (fourteen) days. 2 pen 11  . fluticasone (FLONASE) 50 MCG/ACT nasal spray Place 2 sprays into both nostrils daily as needed for allergies.     Marland Kitchen guaiFENesin (MUCINEX) 600 MG 12 hr tablet Take 1 tablet (600 mg total) by mouth 2 (two) times daily.    Marland Kitchen levothyroxine (SYNTHROID) 112 MCG tablet Take 1 tablet (112 mcg total) by mouth daily. 30 tablet 11  . losartan (COZAAR) 25 MG tablet Take 1 tablet (25 mg total) by mouth daily. 30 tablet 1  . meclizine (ANTIVERT) 25 MG tablet Take 25 mg by mouth every 8 (eight) hours as needed for dizziness.     . metoprolol tartrate (LOPRESSOR) 50 MG tablet Take 1 tablet (50 mg total) by mouth 2 (two) times daily. 60 tablet 1  . Multiple Vitamins-Minerals (CENTRUM SILVER 50+WOMEN PO) Take 1 tablet by mouth daily.     . rosuvastatin (CRESTOR) 20 MG tablet Take 1 tablet (20 mg total) by mouth every evening. 90 tablet 3  . traMADol (ULTRAM) 50 MG tablet Take 1 tablet (50 mg total) by mouth every 6 (six) hours as needed for  moderate pain. 30 tablet 0  . zolpidem (AMBIEN) 10 MG tablet Take 5 mg by mouth at bedtime as needed for sleep.      No current facility-administered medications for this visit.     Physical Exam: BP (!) 170/88   Pulse 76   Temp 97.7 F (36.5 C) (Skin)   Resp 20   Ht 5\' 4"  (1.626 m)   Wt 158 lb (71.7 kg)   SpO2 98% Comment: RA  BMI 27.12 kg/m  She looks well. Cardiac exam shows a regular rate and rhythm with normal heart sounds.  There is no murmur. Lungs are clear. The chest incision is healing well and the sternum is stable. There is no peripheral edema.  Diagnostic Tests: None today  Impression:  She is doing well 2 months following her surgery.  She is very concerned about the cost of cardiac rehab and I think she is active enough that she can do her own rehab by getting out to walk.  She is in agreement with that and would like to stop cardiac rehab.  I think that is fine as long as she is walking at least 20 to 30 minutes/day.  I asked her not to lift anything heavier than 10 pounds for 3 months postoperatively.     Plan:  She will continue to follow-up  with Dr. Reynaldo Barry and Dr. Claiborne Barry and will return to see me if she has any problems with her incisions.    Joann Pollack, MD [facility] [phone]

## 2020-03-08 ENCOUNTER — Other Ambulatory Visit: Payer: Self-pay | Admitting: Cardiovascular Disease

## 2020-03-17 ENCOUNTER — Other Ambulatory Visit: Payer: Self-pay | Admitting: Surgery

## 2020-04-06 ENCOUNTER — Other Ambulatory Visit: Payer: Self-pay | Admitting: Cardiovascular Disease

## 2020-04-19 ENCOUNTER — Encounter: Payer: Self-pay | Admitting: Cardiovascular Disease

## 2020-04-19 ENCOUNTER — Ambulatory Visit: Payer: Medicare Other | Admitting: Cardiovascular Disease

## 2020-04-19 ENCOUNTER — Other Ambulatory Visit: Payer: Self-pay

## 2020-04-19 DIAGNOSIS — Z953 Presence of xenogenic heart valve: Secondary | ICD-10-CM

## 2020-04-19 DIAGNOSIS — Z79899 Other long term (current) drug therapy: Secondary | ICD-10-CM

## 2020-04-19 DIAGNOSIS — I77810 Thoracic aortic ectasia: Secondary | ICD-10-CM

## 2020-04-19 DIAGNOSIS — E7841 Elevated Lipoprotein(a): Secondary | ICD-10-CM

## 2020-04-19 DIAGNOSIS — I10 Essential (primary) hypertension: Secondary | ICD-10-CM

## 2020-04-19 DIAGNOSIS — I350 Nonrheumatic aortic (valve) stenosis: Secondary | ICD-10-CM | POA: Diagnosis not present

## 2020-04-19 NOTE — Patient Instructions (Signed)
Medication Instructions:  Your Physician recommend you continue on your current medication as directed.    *If you need a refill on your cardiac medications before your next appointment, please call your pharmacy*   Lab Work: Your physician recommends that you return for lab work (fasting lipid, LPa, CMP).   If you have labs (blood work) drawn today and your tests are completely normal, you will receive your results only by: Marland Kitchen MyChart Message (if you have MyChart) OR . A paper copy in the mail If you have any lab test that is abnormal or we need to change your treatment, we will call you to review the results.   Testing/Procedures: None   Follow-Up: At [facility], you and your health needs are our priority.  As part of our continuing mission to provide you with exceptional heart care, we have created designated Provider Care Teams.  These Care Teams include your primary Cardiologist (physician) and Advanced Practice Providers (APPs -  Physician Assistants and Nurse Practitioners) who all work together to provide you with the care you need, when you need it.  We recommend signing up for the patient portal called "MyChart".  Sign up information is provided on this After Visit Summary.  MyChart is used to connect with patients for Virtual Visits (Telemedicine).  Patients are able to view lab/test results, encounter notes, upcoming appointments, etc.  Non-urgent messages can be sent to your provider as well.   To learn more about what you can do with MyChart, go to [email or web address].    Your next appointment:   6 month(s)  The format for your next appointment:   In Person  Provider:   [name], MD

## 2020-04-19 NOTE — Progress Notes (Signed)
Cardiology Office Note    Date:  04/21/2020   ID:  [name], [name] 1945-07-15, MRN [id number]  PCP:  Joann Bunting, MD  Cardiologist:  Joann Majestic, MD   No chief complaint on file.  F/U evaluation, initially referred through the courtesy of Dr. Burnard Barry for evaluation of aortic stenosis.  History of Present Illness:  Joann Barry is a 75 y.o. female who I had seen in October 2016 when she was referred by Joann Barry for evaluation of aortic stenosis. I last saw her in July 2020. She presents for a 1 year follow-up evaluation.  Joann Barry has a >  30 year history of hypertension and has been on atenolol 50 mg daily.  She also has a history of osteopenia, anxiety, and GERD.  She had recently seen Joann Barry in because of a systolic cardiac murmur.  She was referred for echo Doppler study which was done on 12/02/2016.  This showed an EF of 55-60%.  She had normal wall motion with grade 1 diastolic dysfunction.  Aortic valve was moderately calcified and functionally bicuspid.  Valve motion was restricted.  She was felt to have moderate aortic stenosis with trivial AR.  Her mean gradient was 28, and peak instantaneous gradient 45 mm.  Her ascending aorta was mildly dilated at 4.5 cm..  There was mild MR, and there was mild left atrial dilatation.  When I initially saw her, she denied any episodes of chest pain or shortness of breath. .  She denied any presyncope or syncope and during periods of anxiety had noticed some mild increased respirations.  When I saw her, since she was asymptomatic.  I recommended close surveillance.  She was hypertensive and had grade 1 diastolic dysfunction and added low-dose losartan 25 mg daily to take with her previously atenolol regimen.  Since her initial evaluation, she has continued to be entirely asymptomatic.  There is only rare shortness of breath when she gets upset.  There is no change in exertional capacity.  She walks.  She denies  palpitations.  She underwent a follow-up echo Doppler study on 05/01/2017.  This had shown some progression of her aortic stenosis such that her mean gradient had increased to 37 mm with a peak instantaneous gradient of 62 mm.  Calculated valve area was 0.8 cm.  There was grade 2 diastolic dysfunction.    When I saw her on May 12, 2017, she  continued to remain asymptomatic.  She specifically denied chest pain, presyncope or syncope, or change in exercise tolerance.  I had recommended that when she had laboratory done with Joann Barry office that they obtain an LP(a) since an increased level has been associated with aortic stenosis.  Her LP(a) level came back elevated at 180.  She has not been on any antilipid therapy.  TC was 182, triglycerides 111, HDL 47, LDL 113.  Her TSH was 0.08 which is low.  She underwent a six-month follow-up echo Doppler study on October 21, 2017.  This essentially was unchanged and showed an EF of 60 to 65% with grade 2 diastolic dysfunction.  Her aortic valve is functionally bicuspid with fusion of the right and left coronary cusps and is calcified.  Her mean gradient was 34 with a peak gradient of 61.  Valve area was 0.73 cm.  Aortic root was mildly dilated with acsending aorta at 44 mm.  PA pressure was 33.    When I  saw her in September 2019 I  reviewed her echo Doppler data.  I recommended initiation of Crestor 20 mg.  Her ECG did not show evidence for LV strain or LVH.  I again had a long discussion regarding aortic stenosis and she remained completely asymptomatic.  I last saw her in July 2020 and she continued to be entirely asymptomatic.  She denied chest pain PND orthopnea exertional dyspnea presyncope or syncope.  She notes a rare episode of shortness of breath when she gets very upset.  She denies any exertional dyspnea.  She has continued to be on atenolol 50 mg daily, losartan 25 mg twice a day.  Apparently she has not been taking her rosuvastatin and actually  never got this prescription filled.  She underwent repeat laboratory in September 2019 by Joann Barry.  LDL cholesterol was 113.   A follow-up echo Doppler study on May 27, 2018  showed normal EF of 55 to 60% with only mildly increased wall thickness.  There was suggestion of grade 2 diastolic dysfunction.  Her left atrial size was moderately dilated.  Visually it appeared that her aortic stenosis was in the moderate to moderately severe range.  Mean gradient was 31 with a peak instantaneous gradient of 55.   She was evaluated by Joann Barry on March 22, 2019. She continues to be symptom-free. Her blood pressure was increased but reportedly her blood pressure at home had been normal. A blood pressure log was recommended.  She underwent a follow-up echo Doppler study on August 24, 2019 this showed an EF 55 to 60% with mild LVH and grade 1 diastolic dysfunction. There is mild dilation of her left atrium. There was mild mitral regurgitation. She was now felt to have severe aortic stenosis with a mean gradient of 40. Her peak instantaneous gradient was 66.3. Aortic valve area was 0.6 cm.  I last saw her on October 13, 2019.  At that time she again denied  any chest pain or any episodes of presyncope or syncope.  However, when I interrogated her further, she perhaps does note some slight shortness of breath which she states she gets when she is worrying but denies any definitive exertional symptoms. She had undergone thyroidectomy in January 2021 by Joann Barry.  She had not had recent laboratory and I recommended she undergo a complete set of laboratory including a follow-up lipoprotein a due to its association with aortic stenosis.  LP(a) was significantly increased previously at 243 and she had been started on PCSK9 inhibition with Joann Barry he was continuing to take rosuvastatin.  I also referred her to see Dr. [name] to allow for discussion concerning possible future TAVR versus open surgery.  She ultimately  underwent definitive cardiac catheterization by me on November 11, 2019 which showed normal coronary arteries.  She had normal to mildly elevated right heart pressures with PA systolic at 38 and mean pressure 23.   She underwent aortic valve replacement by Dr. Arvid Right on December 06, 2019 and received a 23 mm Edwards pericardial valve and she underwent supra coronary replacement of an ascending aortic aneurysm.  She last saw Dr. Cyndia Bent in follow-up in October and at that time her beta-blocker therapy was further increased due to elevated blood pressure and heart rate.  Presently, she feels well.  She denies any chest pain or shortness of breath.  She has continued to be on low-dose losartan 25 mg, Toprol tartrate 50 mg twice a day for blood pressure.  She is on Zoloft for anxiety.  She also  has been on rosuvastatin 20 mg and Joann Barry in light of her previously markedly elevated LP(a).  She has not had recent lab work.  She presents for evaluation.   Past Medical History:  Diagnosis Date  . Anxiety   . Aortic stenosis    documented on cardiac clearance note  . Arthritis   . Elevated lipoprotein A level 04/19/2019  . Heart murmur   . Hypercholesterolemia   . Hypertension   . Personal history of colonic polyps   . S/P thyroidectomy 03/30/2019  . Thyroid nodule    Bilateral    Past Surgical History:  Procedure Laterality Date  . AORTIC VALVE REPLACEMENT N/A 12/06/2019   Procedure: AORTIC VALVE REPLACEMENT (AVR);  Surgeon: Gaye Pollack, MD;  Location: [facility];  Service: Open Heart Surgery;  Laterality: N/A;  . COLONOSCOPY  01/18/2014  . PARTIAL HYSTERECTOMY  1980  . POLYPECTOMY    . REPLACEMENT ASCENDING AORTA N/A 12/06/2019   Procedure: REPLACEMENT OF ASCENDING AORTIC ANEURYSM;  Surgeon: Gaye Pollack, MD;  Location: [facility];  Service: Open Heart Surgery;  Laterality: N/A;  CIRC ARREST  . RIGHT HEART CATH AND CORONARY ANGIOGRAPHY N/A 11/11/2019   Procedure: RIGHT HEART CATH AND CORONARY  ANGIOGRAPHY;  Surgeon: [name], MD;  Location: [facility];  Service: Cardiovascular;  Laterality: N/A;  . TEE WITHOUT CARDIOVERSION N/A 12/06/2019   Procedure: TRANSESOPHAGEAL ECHOCARDIOGRAM (TEE);  Surgeon: Gaye Pollack, MD;  Location: [facility];  Service: Open Heart Surgery;  Laterality: N/A;  . THYROIDECTOMY  03/30/2019  . THYROIDECTOMY N/A 03/30/2019   Procedure: TOTAL THYROIDECTOMY;  Surgeon: [name], MD;  Location: [facility];  Service: General;  Laterality: N/A;    Current Medications: Outpatient Medications Prior to Visit  Medication Sig Dispense Refill  . acetaminophen (TYLENOL) 500 MG tablet Take 2 tablets (1,000 mg total) by mouth every 6 (six) hours as needed for mild pain or fever. 30 tablet 0  . aspirin EC 81 MG tablet Take 81 mg by mouth daily. Swallow whole.    . cholecalciferol (VITAMIN D3) 25 MCG (1000 UNIT) tablet Take 1,000 Units by mouth in the morning and at bedtime.     . fluticasone (FLONASE) 50 MCG/ACT nasal spray Place 2 sprays into both nostrils daily as needed for allergies.     Marland Kitchen guaiFENesin (MUCINEX) 600 MG 12 hr tablet Take 1 tablet (600 mg total) by mouth 2 (two) times daily.    Marland Kitchen levothyroxine (SYNTHROID) 112 MCG tablet Take 1 tablet (112 mcg total) by mouth daily. 30 tablet 11  . losartan (COZAAR) 25 MG tablet Take 1 tablet (25 mg total) by mouth daily. 30 tablet 1  . meclizine (ANTIVERT) 25 MG tablet Take 25 mg by mouth every 8 (eight) hours as needed for dizziness.     . metoprolol tartrate (LOPRESSOR) 50 MG tablet Take 1 tablet (50 mg total) by mouth 2 (two) times daily. 60 tablet 1  . Multiple Vitamins-Minerals (CENTRUM SILVER 50+WOMEN PO) Take 1 tablet by mouth daily.     Marland Kitchen Joann Barry SURECLICK 161 MG/ML SOAJ INJECT 140MG INTO THE SKIN EVERY 14 DAYS 2 mL 11  . rosuvastatin (CRESTOR) 20 MG tablet TAKE 1 TABLET BY MOUTH IN  THE EVENING 90 tablet 3  . sertraline (ZOLOFT) 50 MG tablet Take 50 mg by mouth daily.    Marland Kitchen zolpidem (AMBIEN) 10 MG  tablet Take 5 mg by mouth at bedtime as needed for sleep.     . traMADol Veatrice Bourbon)  50 MG tablet Take 1 tablet (50 mg total) by mouth every 6 (six) hours as needed for moderate pain. 30 tablet 0  . aspirin EC 325 MG EC tablet Take 1 tablet (325 mg total) by mouth daily. 30 tablet 0   No facility-administered medications prior to visit.     Allergies:   Sulfonamide derivatives   Social History   Socioeconomic History  . Marital status: Married    Spouse name: Not on file  . Number of children: Not on file  . Years of education: Not on file  . Highest education level: Not on file  Occupational History  . Not on file  Tobacco Use  . Smoking status: Never Smoker  . Smokeless tobacco: Never Used  Vaping Use  . Vaping Use: Never used  Substance and Sexual Activity  . Alcohol use: No  . Drug use: No  . Sexual activity: Not on file  Other Topics Concern  . Not on file  Social History Narrative  . Not on file   Social Determinants of Health   Financial Resource Strain: Not on file  Food Insecurity: Not on file  Transportation Needs: Not on file  Physical Activity: Not on file  Stress: Not on file  Social Connections: Not on file    Additional social history is notable in that she is married for 50 years.  She has 5 children and 16 great and grandchildren.  She works for [employer].  She completed 12th grade of education.  There is no tobacco history or alcohol use.  Family History:  The patient's family history includes Pancreatic cancer in her mother.  Her mother died at 25 from pancreatic cancer.  Her father died at 63.  One brother had neurofibromatosis.  ROS General: Negative; No fevers, chills, or night sweats;  HEENT: Negative; No changes in vision or hearing, sinus congestion, difficulty swallowing Pulmonary: Negative; No cough, wheezing, shortness of breath, hemoptysis Cardiovascular: See HPI GI: Negative; No nausea, vomiting, diarrhea, or abdominal pain GU:  Negative; No dysuria, hematuria, or difficulty voiding Musculoskeletal: Positive for osteopenia Hematologic/Oncology: Negative; no easy bruising, bleeding Endocrine: Negative; no heat/cold intolerance; no diabetes Neuro: Negative; no changes in balance, headaches Skin: Negative; No rashes or skin lesions Psychiatric: Negative; No behavioral problems, depression Sleep: Negative; No snoring, daytime sleepiness, hypersomnolence, bruxism, restless legs, hypnogognic hallucinations, no cataplexy Other comprehensive 14 point system review is negative.   PHYSICAL EXAM:   VS:  BP (!) 170/82   Pulse 63   Ht 5' 4.5" (1.638 m)   Wt 155 lb 9.6 oz (70.6 kg)   BMI 26.30 kg/m     Repeat blood pressure by me was improved at 136/70.  Wt Readings from Last 3 Encounters:  04/19/20 155 lb 9.6 oz (70.6 kg)  02/08/20 158 lb (71.7 kg)  01/04/20 158 lb (71.7 kg)    General: Alert, oriented, no distress.  Skin: normal turgor, no rashes, warm and dry HEENT: Normocephalic, atraumatic. Pupils equal round and reactive to light; sclera anicteric; extraocular muscles intact; Nose without nasal septal hypertrophy Mouth/Parynx benign; Mallinpatti scale 2 Neck: No JVD, no carotid bruits; normal carotid upstroke Lungs: clear to ausculatation and percussion; no wheezing or rales Chest wall: without tenderness to palpitation Heart: PMI not displaced, RRR, s1 s2 normal, 7-3/4 systolic murmur, no diastolic murmur, no rubs, gallops, thrills, or heaves Abdomen: soft, nontender; no hepatosplenomehaly, BS+; abdominal aorta nontender and not dilated by palpation. Back: no CVA tenderness Pulses 2+ Musculoskeletal: full range  of motion, normal strength, no joint deformities Extremities: no clubbing cyanosis or edema, Homan's sign negative  Neurologic: grossly nonfocal; Cranial nerves grossly wnl Psychologic: Normal mood and affect   Studies/Labs Reviewed:   EKG:  EKG is ordered today.  ECG (independently read by me):  Normal sinus rhythm at 63 bpm, nonspecific T wave abnormality.  Normal intervals.  No ectopy.  July 2021 ECG (independently read by me): Normal sinus rhythm at 63 bpm.  No ectopy.  No LVH or strain.  July 2020 ECG (independently read by me): Sinus Bradycardia at 58; no ectopy, LVH or strain  September 2019 ECG (independently read by me): Normal sinus rhythm at 70 bpm.  No ectopy. No LVH or strain  February 2019 ECG (independently read by me): Sinus bradycardia 58 bpm.  PR interval 172 ms.  QTc interval 426 ms.  No ectopy.  October 2018 ECG (independently read by me): Normal sinus rhythm at 68 bpm.  Normal intervals.  No ST segment changes.  No ectopy.  Recent Labs: BMP Latest Ref Rng & Units 12/10/2019 12/09/2019 12/08/2019  Glucose 70 - 99 mg/dL 133(H) 113(H) 128(H)  BUN 8 - 23 mg/dL 13 12 21   Creatinine 0.44 - 1.00 mg/dL 0.73 0.68 0.83  BUN/Creat Ratio 12 - 28 - - -  Sodium 135 - 145 mmol/L 137 138 138  Potassium 3.5 - 5.1 mmol/L 4.0 4.2 4.5  Chloride 98 - 111 mmol/L 102 103 107  CO2 22 - 32 mmol/L 27 28 26   Calcium 8.9 - 10.3 mg/dL 8.6(L) 8.6(L) 8.4(L)     Hepatic Function Latest Ref Rng & Units 12/02/2019 10/13/2019 03/09/2019  Total Protein 6.5 - 8.1 g/dL 6.8 6.5 6.6  Albumin 3.5 - 5.0 g/dL 4.0 4.1 4.3  AST 15 - 41 U/L 22 15 14   ALT 0 - 44 U/L 20 12 11   Alk Phosphatase 38 - 126 U/L 58 66 63  Total Bilirubin 0.3 - 1.2 mg/dL 0.8 0.5 0.6    CBC Latest Ref Rng & Units 12/10/2019 12/08/2019 12/07/2019  WBC 4.0 - 10.5 K/uL 9.1 8.8 15.5(H)  Hemoglobin 12.0 - 15.0 g/dL 10.3(L) 9.3(L) 9.8(L)  Hematocrit 36.0 - 46.0 % 30.9(L) 28.4(L) 30.1(L)  Platelets 150 - 400 K/uL 146(L) 93(L) 115(L)   Lab Results  Component Value Date   MCV 91.7 12/10/2019   MCV 93.4 12/08/2019   MCV 91.2 12/07/2019   Lab Results  Component Value Date   TSH 0.025 (L) 10/13/2019   Lab Results  Component Value Date   HGBA1C 5.6 12/02/2019     BNP No results found for: BNP  ProBNP No results found  for: PROBNP   Lipid Panel     Component Value Date/Time   CHOL 94 (L) 10/13/2019 0858   TRIG 78 10/13/2019 0858   HDL 49 10/13/2019 0858   CHOLHDL 1.9 10/13/2019 0858   LDLCALC 29 10/13/2019 0858     RADIOLOGY: No results found.   Additional studies/ records that were reviewed today include:  I reviewed the medical records from [facility] including the echo Doppler study.  Office visits, ECG, chest x-ray, and laboratory.  Glucose 108, BUN 13, Cr 0.8. We will leave that there letter daddies a creatinine of he wanted get this stuff so he does not stop right Cholesterol 187, triglycerides 105, HDL 44, LDL 122.  Non-HDL 43. TSH 0.21 Vitamin D 27.6  Laboratory at [facility] on December 14, 2017, LP(a) 180, BUN 16 creatinine 0.8.  Total cholesterol 182, triglycerides  111, HDL 47, LDL 113.  Non-HDL 135.  TSH 0.08.  Vitamin D 25.4.   ASSESSMENT:    1. Essential hypertension   2. Severe aortic stenosis   3. S/P aortic valve replacement with bioprosthetic valve   4. Dilated aortic root ([place]): s/p supra coronary replacement of an ascending aortic aneurysm   5. Elevated lipoprotein(a)   6. Medication management     PLAN:  Ms. Akeila Lana is a very pleasant 75 year old female who was initally referred by Joann Barry for evaluation of cardiac murmur.  Her cardiac murmur was concordant with aortic valve stenosis and her initial echocardiographic evaluation in September 2018 demonstrated normal systolic function with grade 1 diastolic dysfunction and moderate aortic stenosis with a mean gradient of 28, peak instantaneous gradient to 45 mm Hg. in addition, there was poststenotic aortic root dilatation at 4.5 cm.  When I initially saw her, she was completely asymptomatic with reference to chest pain, PND, orthopnea, presyncope or syncope and did not have any signs or symptoms of CHF.  She was hypertensive.  With the addition of losartan 25 mg once daily her blood pressure has  improved but remained elevated and subsequently was further titrated to 25 mg twice a day losartan was increased to 25 mg twice a day.  Since there is a reported increased incidence of increased LP(a) with aortic stenosis, I obtained an LP(a) evaluation which proved to be significantly elevated at 180. When subsequently seen her LDL cholesterol was 113.  I had recommended initiation of rosuvastatin which she never filled. At a subsequent office visit she agreed to initiate therapy and rosuvastatin was started at 10 mg and titrated up to 20 mg daily. In the future there will be specific therapy potentially aimed at reducing LP(a).  PCSK9 inhibitors have been shown to reduce this level by approximately 26%.  With a subsequent LP(a) level at 243, Joann Barry was initiated.  Aortic stenosis significantly progressed to a mean gradient of 40 and peak instantaneous gradient of 66.  She subsequently underwent catheterization which revealed normal coronary arteries.  On December 06, 2019 she underwent successful aortic valve replacement utilizing a 23 mm Edwards pericardial valve and supra coronary replacement of her ascending aortic aneurysm.  Subsequently she has done well and had required some medication titration for blood pressure and heart rate control.  On initial presentation today she was hypertensive but on repeat by me her blood pressure was 136/70.  She continues to be on losartan 25 mg metoprolol tartrate 50 mg twice a day.  She will monitor her blood pressure and if she continues to have significant blood pressure lability losartan may need to be increased to 50 mg daily.  She has continued to be 8 be on both rosuvastatin and Joann Barry.  I will recheck fasting lipid studies, a compress of metabolic panel and will obtain a follow-up LP(a).  If her LDL cholesterol continues to be extremely low as it had been down to 29, rosuvastatin may be able to be decreased or discontinued and she will continue with Joann Barry alone.   She takes Zoloft as needed for some anxiety.  She continues to be on levothyroxine for hypothyroidism.  I will contact her regarding her laboratory.  I will see her in 6 months for reevaluation or sooner as needed.   Medication Adjustments/Labs and Tests Ordered: Current medicines are reviewed at length with the patient today.  Concerns regarding medicines are outlined above.  Medication changes, Labs and Tests ordered today are  listed in the Patient Instructions below. Patient Instructions  Medication Instructions:  Your Physician recommend you continue on your current medication as directed.    *If you need a refill on your cardiac medications before your next appointment, please call your pharmacy*   Lab Work: Your physician recommends that you return for lab work (fasting lipid, LPa, CMP).   If you have labs (blood work) drawn today and your tests are completely normal, you will receive your results only by: Marland Kitchen MyChart Message (if you have MyChart) OR . A paper copy in the mail If you have any lab test that is abnormal or we need to change your treatment, we will call you to review the results.   Testing/Procedures: None   Follow-Up: At [facility] you and your health needs are our priority.  As part of our continuing mission to provide you with exceptional heart care, we have created designated Provider Care Teams.  These Care Teams include your primary Cardiologist (physician) and Advanced Practice Providers (APPs -  Physician Assistants and Nurse Practitioners) who all work together to provide you with the care you need, when you need it.  We recommend signing up for the patient portal called "MyChart".  Sign up information is provided on this After Visit Summary.  MyChart is used to connect with patients for Virtual Visits (Telemedicine).  Patients are able to view lab/test results, encounter notes, upcoming appointments, etc.  Non-urgent messages can be sent to your provider  as well.   To learn more about what you can do with MyChart, go to [email or web address].    Your next appointment:   6 month(s)  The format for your next appointment:   In Person  Provider:   [name], MD       Signed, Joann Majestic, MD  04/21/2020 12:15 PM    [facility] [address], [place], [place], [place]  [address] Phone: [phone]

## 2020-04-21 ENCOUNTER — Encounter: Payer: Self-pay | Admitting: Cardiovascular Disease

## 2020-04-25 LAB — COMPREHENSIVE METABOLIC PANEL
ALT: 28 IU/L (ref 0–32)
AST: 23 IU/L (ref 0–40)
Albumin/Globulin Ratio: 1.8 (ref 1.2–2.2)
Albumin: 4.2 g/dL (ref 3.7–4.7)
Alkaline Phosphatase: 73 IU/L (ref 44–121)
BUN/Creatinine Ratio: 28 (ref 12–28)
BUN: 22 mg/dL (ref 8–27)
Bilirubin Total: 0.4 mg/dL (ref 0.0–1.2)
CO2: 23 mmol/L (ref 20–29)
Calcium: 9.3 mg/dL (ref 8.7–10.3)
Chloride: 104 mmol/L (ref 96–106)
Creatinine, Ser: 0.8 mg/dL (ref 0.57–1.00)
GFR calc Af Amer: 83 mL/min/{1.73_m2} (ref >59)
GFR calc non Af Amer: 72 mL/min/{1.73_m2} (ref >59)
Globulin, Total: 2.4 g/dL (ref 1.5–4.5)
Glucose: 112 mg/dL — ABNORMAL HIGH (ref 65–99)
Potassium: 4.7 mmol/L (ref 3.5–5.2)
Sodium: 141 mmol/L (ref 134–144)
Total Protein: 6.6 g/dL (ref 6.0–8.5)

## 2020-04-25 LAB — LIPOPROTEIN A (LPA): Lipoprotein (a): 215.8 nmol/L — ABNORMAL HIGH (ref <75.0)

## 2020-04-25 LAB — LIPID PANEL
Chol/HDL Ratio: 1.6 ratio (ref 0.0–4.4)
Cholesterol, Total: 92 mg/dL — ABNORMAL LOW (ref 100–199)
HDL: 56 mg/dL (ref >39)
LDL Chol Calc (NIH): 21 mg/dL (ref 0–99)
Triglycerides: 67 mg/dL (ref 0–149)
VLDL Cholesterol Cal: 15 mg/dL (ref 5–40)

## 2020-05-25 NOTE — Telephone Encounter (Signed)
Sent this message to patient   Good morning, Do legs hurt in our joints - like hips and knees or the entire leg.  I will send this message to Dr [name] and our Pharmacist here at [facility] for response Thanks  [facility]

## 2020-10-30 ENCOUNTER — Other Ambulatory Visit: Payer: Self-pay

## 2020-10-30 ENCOUNTER — Ambulatory Visit: Payer: Medicare Other | Admitting: Physician Assistant

## 2020-10-30 VITALS — BP 163/83 | HR 64 | Ht 64.5 in | Wt 160.0 lb

## 2020-10-30 DIAGNOSIS — I10 Essential (primary) hypertension: Secondary | ICD-10-CM

## 2020-10-30 DIAGNOSIS — E039 Hypothyroidism, unspecified: Secondary | ICD-10-CM

## 2020-10-30 DIAGNOSIS — Z8679 Personal history of other diseases of the circulatory system: Secondary | ICD-10-CM

## 2020-10-30 DIAGNOSIS — Z9889 Other specified postprocedural states: Secondary | ICD-10-CM | POA: Diagnosis not present

## 2020-10-30 DIAGNOSIS — I77810 Thoracic aortic ectasia: Secondary | ICD-10-CM

## 2020-10-30 DIAGNOSIS — E785 Hyperlipidemia, unspecified: Secondary | ICD-10-CM

## 2020-10-30 MED ORDER — LOSARTAN POTASSIUM 50 MG PO TABS: 50.0000 mg | ORAL_TABLET | Freq: Every day | ORAL | 3 refills | Status: DC

## 2020-10-30 NOTE — Progress Notes (Signed)
Cardiology Office Note:    Date:  11/01/2020   ID:  Joann Barry, DOB Nov 12, 1945, MRN [id number]  PCP:  [name], MD   [facility] Providers Cardiologist:  [name], MD Structural Heart:  Sherren Mocha, MD    Referring MD: [name], MD   Chief Complaint  Patient presents with   Follow-up    History of Present Illness:    Joann Barry is a 75 y.o. female with a hx of dilated aortic root and aortic stenosis s/p AVR and aortic root replacement, hyperlipidemia, hypertension, and hypothyroidism s/p thyroidectomy.  Patient was initially referred to cardiology service for evaluation of aortic stenosis.  Echocardiogram obtained in September 2018 showed EF 55 to 60%, normal wall motion, grade 1 DD, moderately calcified aortic valve and functionally bicuspid, moderate aortic stenosis with trivial AI, mildly dilated ascending aorta measuring at 4.5 cm, mild MR.  Lipoprotein a obtained in 2019 was elevated at 180.  She was started on Crestor 20 mg daily in September 2019.  Given her asymptomatic nature, she has been monitored for her aortic stenosis.  Follow-up echocardiogram in March 2020 showed EF 55 to 60%, mildly thickened LV wall, grade 2 DD, moderate LAE, moderate to severe aortic stenosis.  She was referred to Dr. [name] for consideration of future TAVR versus open surgery.  She underwent definitive cardiac catheterization in August 2021 that showed normal coronary arteries, PA systolic pressure at 38 mmHg.  She underwent aortic valve replacement by Dr. Cyndia Bent on 12/06/2019 and received a 23 mm Edwards pericardial valve and supra coronary replacement of the ascending aortic aneurysm.  Patient presents today for follow-up.  She denies any significant discomfort.  She has no lower extremity edema, orthopnea or PND.  She still has 1 out of 6 heart murmur on physical exam near the aortic valve area.  She is due for repeat echocardiogram near the end of this year around  November.  Back in February, our clinical pharmacist instructed the patient to decrease her Crestor to half a tablet on Monday and Friday only.  She did reduce her Crestor to Monday and Friday, however she is taking a full tablet on those days.  She is not having any side effects from the crestor, I recommended continue on the current therapy.  She needs a fasting lipid panel and LFT.  Otherwise she can follow-up with Dr. [name] in 6 months.   Past Medical History:  Diagnosis Date   Anxiety    Aortic stenosis    documented on cardiac clearance note   Arthritis    Elevated lipoprotein A level 04/19/2019   Heart murmur    Hypercholesterolemia    Hypertension    Personal history of colonic polyps    S/P thyroidectomy 03/30/2019   Thyroid nodule    Bilateral    Past Surgical History:  Procedure Laterality Date   AORTIC VALVE REPLACEMENT N/A 12/06/2019   Procedure: AORTIC VALVE REPLACEMENT (AVR);  Surgeon: Gaye Pollack, MD;  Location: [facility];  Service: Open Heart Surgery;  Laterality: N/A;   COLONOSCOPY  01/18/2014   PARTIAL HYSTERECTOMY  1980   POLYPECTOMY     REPLACEMENT ASCENDING AORTA N/A 12/06/2019   Procedure: REPLACEMENT OF ASCENDING AORTIC ANEURYSM;  Surgeon: Gaye Pollack, MD;  Location: [place];  Service: Open Heart Surgery;  Laterality: N/A;  CIRC ARREST   RIGHT HEART CATH AND CORONARY ANGIOGRAPHY N/A 11/11/2019   Procedure: RIGHT HEART CATH AND CORONARY ANGIOGRAPHY;  Surgeon: [name]  A, MD;  Location: [place] [facility];  Service: Cardiovascular;  Laterality: N/A;   TEE WITHOUT CARDIOVERSION N/A 12/06/2019   Procedure: TRANSESOPHAGEAL ECHOCARDIOGRAM (TEE);  Surgeon: Gaye Pollack, MD;  Location: [place];  Service: Open Heart Surgery;  Laterality: N/A;   THYROIDECTOMY  03/30/2019   THYROIDECTOMY N/A 03/30/2019   Procedure: TOTAL THYROIDECTOMY;  Surgeon: [name], MD;  Location: [facility];  Service: General;  Laterality: N/A;    Current Medications: Current Meds   Medication Sig   aspirin EC 81 MG tablet Take 81 mg by mouth daily. Swallow whole.   cholecalciferol (VITAMIN D3) 25 MCG (1000 UNIT) tablet Take 1,000 Units by mouth in the morning and at bedtime.    fluticasone (FLONASE) 50 MCG/ACT nasal spray Place 2 sprays into both nostrils daily as needed for allergies.    levothyroxine (SYNTHROID) 112 MCG tablet Take 1 tablet (112 mcg total) by mouth daily.   metoprolol tartrate (LOPRESSOR) 50 MG tablet Take 1 tablet (50 mg total) by mouth 2 (two) times daily.   Multiple Vitamins-Minerals (CENTRUM SILVER 50+WOMEN PO) Take 1 tablet by mouth daily.    REPATHA SURECLICK [id number] MG/ML SOAJ INJECT '140MG'$  INTO THE SKIN EVERY 14 DAYS   rosuvastatin (CRESTOR) 20 MG tablet TAKE 1 TABLET BY MOUTH IN  THE EVENING   sertraline (ZOLOFT) 50 MG tablet Take 50 mg by mouth daily.   [DISCONTINUED] losartan (COZAAR) 25 MG tablet Take 1 tablet (25 mg total) by mouth daily.     Allergies:   Sulfonamide derivatives   Social History   Socioeconomic History   Marital status: Married    Spouse name: Not on file   Number of children: Not on file   Years of education: Not on file   Highest education level: Not on file  Occupational History   Not on file  Tobacco Use   Smoking status: Never   Smokeless tobacco: Never  Vaping Use   Vaping Use: Never used  Substance and Sexual Activity   Alcohol use: No   Drug use: No   Sexual activity: Not on file  Other Topics Concern   Not on file  Social History Narrative   Not on file   Social Determinants of Health   Financial Resource Strain: Not on file  Food Insecurity: Not on file  Transportation Needs: Not on file  Physical Activity: Not on file  Stress: Not on file  Social Connections: Not on file     Family History: The patient's family history includes Pancreatic cancer in her mother. There is no history of Colon cancer, Rectal cancer, Stomach cancer, Esophageal cancer, or Colon polyps.  ROS:   Please see the  history of present illness.     All other systems reviewed and are negative.  EKGs/Labs/Other Studies Reviewed:    The following studies were reviewed today:  Echo 01/16/2020  1. Left ventricular ejection fraction, by estimation, is 55 to 60%. The  left ventricle has normal function. The left ventricle has no regional  wall motion abnormalities. There is mild concentric left ventricular  hypertrophy. Left ventricular diastolic  parameters are indeterminate.   2. Right ventricular systolic function is normal. The right ventricular  size is normal. Tricuspid regurgitation signal is inadequate for assessing  PA pressure.   3. Left atrial size was moderately dilated.   4. Right atrial size was moderately dilated.   5. The mitral valve is normal in structure. Mild mitral valve  regurgitation. No evidence of mitral  stenosis.   6. The aortic valve has been repaired/replaced. Aortic valve  regurgitation is not visualized. There is a 23 mm Edwards bovine valve  present in the aortic position. Procedure Date: 12/06/2019. Echo findings  are consistent with normal structure and  function of the aortic valve prosthesis. Aortic valve mean gradient  measures 7.0 mmHg.   7. S/P thoracic aortic aneurysm repair 12/06/19 with hemishield graft.  Aortic root/ascending aorta has been repaired/replaced.   8. The inferior vena cava is normal in size with greater than 50%  respiratory variability, suggesting right atrial pressure of 3 mmHg.   Comparison(s): Changes from prior study are noted.   Conclusion(s)/Recommendation(s): Patient is s/p AVR and TAA repair since  last transthoracic echo. Bioprosthestic AVR and graft are well seated,  without regurgitation/leak visualized.   EKG:  EKG is not ordered today.    Recent Labs: 12/07/2019: Magnesium 2.4 12/10/2019: Hemoglobin 10.3; Platelets 146 04/24/2020: ALT 28; BUN 22; Creatinine, Ser 0.80; Potassium 4.7; Sodium 141  Recent Lipid Panel    Component  Value Date/Time   CHOL 92 (L) 04/24/2020 0856   TRIG 67 04/24/2020 0856   HDL 56 04/24/2020 0856   CHOLHDL 1.6 04/24/2020 0856   LDLCALC 21 04/24/2020 0856     Risk Assessment/Calculations:           Physical Exam:    VS:  BP (!) 163/83   Pulse 64   Ht 5' 4.5" (1.638 m)   Wt 160 lb (72.6 kg)   SpO2 98%   BMI 27.04 kg/m     Wt Readings from Last 3 Encounters:  10/30/20 160 lb (72.6 kg)  04/19/20 155 lb 9.6 oz (70.6 kg)  02/08/20 158 lb (71.7 kg)     GEN:  Well nourished, well developed in no acute distress HEENT: Normal NECK: No JVD; No carotid bruits LYMPHATICS: No lymphadenopathy CARDIAC: RRR, no murmurs, rubs, gallops RESPIRATORY:  Clear to auscultation without rales, wheezing or rhonchi  ABDOMEN: Soft, non-tender, non-distended MUSCULOSKELETAL:  No edema; No deformity  SKIN: Warm and dry NEUROLOGIC:  Alert and oriented x 3 PSYCHIATRIC:  Normal affect   ASSESSMENT:    1. History of aortic valve repair   2. Hyperlipidemia, unspecified hyperlipidemia type   3. Dilated aortic root ([place]): s/p supra coronary replacement of an ascending aortic aneurysm   4. Primary hypertension   5. Hypothyroidism, unspecified type    PLAN:    In order of problems listed above:  History of aortic valve repair: Stable on last echocardiogram in November 2021.  Pending repeat echocardiogram in November 2022.  Hyperlipidemia: Continue on Crestor every Monday and Friday.  She is also on Repatha.  Unable to tolerate daily dosing of Crestor due to myalgia.  Dilated aortic root s/p aortic root replacement: Repeat echocardiogram in November 2022.  Hypertension: Blood pressure elevated.  Will increase losartan to 50 mg daily  Hypothyroidism: On levothyroxine        Medication Adjustments/Labs and Tests Ordered: Current medicines are reviewed at length with the patient today.  Concerns regarding medicines are outlined above.  Orders Placed This Encounter  Procedures   Lipid  panel   Hepatic function panel   ECHOCARDIOGRAM COMPLETE   Meds ordered this encounter  Medications   losartan (COZAAR) 50 MG tablet    Sig: Take 1 tablet (50 mg total) by mouth daily.    Dispense:  90 tablet    Refill:  3    Dose change new Rx  Patient Instructions  Medication Instructions:  INCREASE Losartan to 50 mg daily  *If you need a refill on your cardiac medications before your next appointment, please call your pharmacy*  Lab Work: Your physician recommends that you return for lab work:  Fasting Lipid Panel-DO NOT EAT OR DRINK PAST MIDNIGHT. OKAY TO HAVE WATER. Hepatic (Liver) Function Test   If you have labs (blood work) drawn today and your tests are completely normal, you will receive your results only by: MyChart Message (if you have MyChart) OR A paper copy in the mail If you have any lab test that is abnormal or we need to change your treatment, we will call you to review the results.  Testing/Procedures: Your physician has requested that you have an echocardiogram. Echocardiography is a painless test that uses sound waves to create images of your heart. It provides your doctor with information about the size and shape of your heart and how well your heart's chambers and valves are working. This procedure takes approximately one hour. There are no restrictions for this procedure. This test is performed at [address] [place] [address]  Please schedule for November 2022 at [facility] office   Follow-Up: At [facility], you and your health needs are our priority.  As part of our continuing mission to provide you with exceptional heart care, we have created designated Provider Care Teams.  These Care Teams include your primary Cardiologist (physician) and Advanced Practice Providers (APPs -  Physician Assistants and Nurse Practitioners) who all work together to provide you with the care you need, when you need it.   Your next appointment:   6  month(s)  The format for your next appointment:   In Person  Provider:   [name], MD  Other Instructions MONITOR blood pressure (BP) at home 1-2 times a week. Bring BP log to follow up appointment.    Hilbert Corrigan, [place]  11/01/2020 10:51 PM    [facility]

## 2020-10-30 NOTE — Patient Instructions (Addendum)
Medication Instructions:  INCREASE Losartan to 50 mg daily  *If you need a refill on your cardiac medications before your next appointment, please call your pharmacy*  Lab Work: Your physician recommends that you return for lab work:  Fasting Lipid Panel-DO NOT EAT OR DRINK PAST MIDNIGHT. OKAY TO HAVE WATER. Hepatic (Liver) Function Test   If you have labs (blood work) drawn today and your tests are completely normal, you will receive your results only by: MyChart Message (if you have MyChart) OR A paper copy in the mail If you have any lab test that is abnormal or we need to change your treatment, we will call you to review the results.  Testing/Procedures: Your physician has requested that you have an echocardiogram. Echocardiography is a painless test that uses sound waves to create images of your heart. It provides your doctor with information about the size and shape of your heart and how well your heart's chambers and valves are working. This procedure takes approximately one hour. There are no restrictions for this procedure. This test is performed at [address] [place] [address]  Please schedule for November 2022 at [facility] office   Follow-Up: At [facility], you and your health needs are our priority.  As part of our continuing mission to provide you with exceptional heart care, we have created designated Provider Care Teams.  These Care Teams include your primary Cardiologist (physician) and Advanced Practice Providers (APPs -  Physician Assistants and Nurse Practitioners) who all work together to provide you with the care you need, when you need it.   Your next appointment:   6 month(s)  The format for your next appointment:   In Person  Provider:   [name], MD  Other Instructions MONITOR blood pressure (BP) at home 1-2 times a week. Bring BP log to follow up appointment.

## 2020-11-06 LAB — HEPATIC FUNCTION PANEL
ALT: 15 IU/L (ref 0–32)
AST: 17 IU/L (ref 0–40)
Albumin: 4.2 g/dL (ref 3.7–4.7)
Alkaline Phosphatase: 72 IU/L (ref 44–121)
Bilirubin Total: 0.4 mg/dL (ref 0.0–1.2)
Bilirubin, Direct: 0.14 mg/dL (ref 0.00–0.40)
Total Protein: 6.7 g/dL (ref 6.0–8.5)

## 2020-11-06 LAB — LIPID PANEL
Chol/HDL Ratio: 1.8 ratio (ref 0.0–4.4)
Cholesterol, Total: 93 mg/dL — ABNORMAL LOW (ref 100–199)
HDL: 52 mg/dL (ref >39)
LDL Chol Calc (NIH): 23 mg/dL (ref 0–99)
Triglycerides: 94 mg/dL (ref 0–149)
VLDL Cholesterol Cal: 18 mg/dL (ref 5–40)

## 2020-12-05 ENCOUNTER — Encounter: Payer: Self-pay | Admitting: Surgery

## 2021-01-15 ENCOUNTER — Ambulatory Visit (HOSPITAL_COMMUNITY): Payer: Medicare Other | Attending: Cardiology

## 2021-01-15 ENCOUNTER — Encounter (HOSPITAL_COMMUNITY): Payer: Self-pay

## 2021-01-15 ENCOUNTER — Other Ambulatory Visit: Payer: Self-pay

## 2021-01-15 DIAGNOSIS — Z8679 Personal history of other diseases of the circulatory system: Secondary | ICD-10-CM | POA: Diagnosis not present

## 2021-01-15 DIAGNOSIS — E785 Hyperlipidemia, unspecified: Secondary | ICD-10-CM | POA: Diagnosis not present

## 2021-01-15 DIAGNOSIS — Z9889 Other specified postprocedural states: Secondary | ICD-10-CM

## 2021-01-15 DIAGNOSIS — Z953 Presence of xenogenic heart valve: Secondary | ICD-10-CM | POA: Insufficient documentation

## 2021-01-15 DIAGNOSIS — I340 Nonrheumatic mitral (valve) insufficiency: Secondary | ICD-10-CM | POA: Insufficient documentation

## 2021-01-15 DIAGNOSIS — I10 Essential (primary) hypertension: Secondary | ICD-10-CM

## 2021-01-15 LAB — ECHOCARDIOGRAM COMPLETE
AV Mean grad: 11 mmHg
AV Peak grad: 19.4 mmHg
Ao pk vel: 2.2 m/s
Area-P 1/2: 4.15 cm2
S' Lateral: 3.5 cm

## 2021-01-15 NOTE — Progress Notes (Signed)
Verified appointment "no show" status with L. Walters at [facility].

## 2021-03-01 ENCOUNTER — Other Ambulatory Visit: Payer: Self-pay | Admitting: Cardiovascular Disease

## 2021-03-07 ENCOUNTER — Telehealth: Payer: Self-pay | Admitting: Cardiovascular Disease

## 2021-03-07 NOTE — Telephone Encounter (Signed)
pa REPATHA SURE INJ 140MG /ML is approved through 03/16/2022. Your patient may now fill this prescription and it will be covered. Called and spoke w/pt and stated that they are approved for [employer] is as follows: [employer] ID [id number]   PATIENT Joann Barry   STATUS  Active   START DATE 04/11/2020   END DATE 04/10/2021   ASSISTANCE TYPE Co-pay   PAID $883.20   PENDING $0.00   BALANCE $1616.80 Pharmacy Card CARD NO. [id number]   CARD STATUS Active   BIN [id number]   PCN [id number]   PC GROUP [id number]   HELP DESK [phone]   PROVIDER PDMI   PROCESSOR PDMI

## 2021-03-07 NOTE — Telephone Encounter (Signed)
Spoke with pt regarding repatha. Pt went to pick up her next dose and was told that the cost would be $138. Pt is unaware if she is in the donut hole. Advised pt that some people are dealing with this issue and she might have to wait until Jan 1 to refill prescription. In the meantime will have prior auth submitted to see if that is the issue. Pt verbalizes understanding. Pt would like a call when we hear back from prior auth.

## 2021-03-07 NOTE — Telephone Encounter (Signed)
Patient states she just received a letter from her insurance company saying that her repatha was going to cost her $138.  She is on plan 2 advantage. She thinks in the past Dr. [name] had to send something in for it to be covered.

## 2021-04-08 ENCOUNTER — Encounter: Payer: Self-pay | Admitting: Cardiovascular Disease

## 2021-04-25 ENCOUNTER — Encounter: Payer: Self-pay | Admitting: Cardiovascular Disease

## 2021-04-25 ENCOUNTER — Ambulatory Visit: Payer: Medicare Other | Admitting: Cardiovascular Disease

## 2021-04-25 ENCOUNTER — Other Ambulatory Visit: Payer: Self-pay

## 2021-04-25 DIAGNOSIS — I10 Essential (primary) hypertension: Secondary | ICD-10-CM

## 2021-04-25 DIAGNOSIS — I77810 Thoracic aortic ectasia: Secondary | ICD-10-CM | POA: Diagnosis not present

## 2021-04-25 DIAGNOSIS — E7841 Elevated Lipoprotein(a): Secondary | ICD-10-CM

## 2021-04-25 DIAGNOSIS — E785 Hyperlipidemia, unspecified: Secondary | ICD-10-CM | POA: Diagnosis not present

## 2021-04-25 DIAGNOSIS — Z953 Presence of xenogenic heart valve: Secondary | ICD-10-CM

## 2021-04-25 MED ORDER — LOSARTAN POTASSIUM 50 MG PO TABS: 75.0000 mg | ORAL_TABLET | Freq: Every day | ORAL | 3 refills | Status: DC

## 2021-04-25 NOTE — Progress Notes (Signed)
Cardiology Office Note    Date:  04/29/2021   ID:  Joann Barry, Joann Barry Joann Barry Joann Barry, Joann Barry, MRN [id number]  PCP:  Joann Bunting, MD  Cardiologist:  Joann Majestic, MD   No chief complaint on file.  F/U evaluation, initially referred through the courtesy of Dr. Burnard Barry for evaluation of aortic stenosis.  History of Present Illness:  Joann Barry is a 76 y.o. female who I had seen in October 2016 when she was referred by Joann Barry for evaluation of aortic stenosis. I last saw her in February 2022.  She presents for a 1 year follow-up evaluation.  Joann Barry has a >  30 year history of hypertension and has been on atenolol 50 mg daily.  She also has a history of osteopenia, anxiety, and GERD.  She had recently seen Joann Barry in because of a systolic cardiac murmur.  She was referred for echo Doppler study which was done on 12/02/2016.  This showed an EF of 55-60%.  She had normal wall motion with grade 1 diastolic dysfunction.  Aortic valve was moderately calcified and functionally bicuspid.  Valve motion was restricted.  She was felt to have moderate aortic stenosis with trivial AR.  Her mean gradient was 28, and peak instantaneous gradient 45 mm.  Her ascending aorta was mildly dilated at 4.5 cm..  There was mild MR, and there was mild left atrial dilatation.  When I initially saw her, she denied any episodes of chest pain or shortness of breath. .  She denied any presyncope or syncope and during periods of anxiety had noticed some mild increased respirations.  When I saw her, since she was asymptomatic.  I recommended close surveillance.  She was hypertensive and had grade 1 diastolic dysfunction and added low-dose losartan 25 mg daily to take with her previously atenolol regimen.  Since her initial evaluation, she has continued to be entirely asymptomatic.  There is only rare shortness of breath when she gets upset.  There is no change in exertional capacity.  She walks.  She denies  palpitations.  She underwent a follow-up echo Doppler study on 05/01/2017.  This had shown some progression of her aortic stenosis such that her mean gradient had increased to 37 mm with a peak instantaneous gradient of 62 mm.  Calculated valve area was 0.8 cm.  There was grade 2 diastolic dysfunction.    When I saw her on May 12, 2017, she  continued to remain asymptomatic.  She specifically denied chest pain, presyncope or syncope, or change in exercise tolerance.  I had recommended that when she had laboratory done with Joann Barry office that they obtain an LP(a) since an increased level has been associated with aortic stenosis.  Her LP(a) level came back elevated at 180.  She has not been on any antilipid therapy.  TC was 182, triglycerides 111, HDL 47, LDL 113.  Her TSH was 0.08 which is low.  She underwent a six-month follow-up echo Doppler study on October 21, 2017.  This essentially was unchanged and showed an EF of 60 to 65% with grade 2 diastolic dysfunction.  Her aortic valve is functionally bicuspid with fusion of the right and left coronary cusps and is calcified.  Her mean gradient was 34 with a peak gradient of 61.  Valve area was 0.73 cm.  Aortic root was mildly dilated with acsending aorta at 44 mm.  PA pressure was 33.    When I  saw her in September 2019  I reviewed her echo Doppler data.  I recommended initiation of Crestor 20 mg.  Her ECG did not show evidence for LV strain or LVH.  I again had a long discussion regarding aortic stenosis and she remained completely asymptomatic.  I last saw her in July 2020 and she continued to be entirely asymptomatic.  She denied chest pain PND orthopnea exertional dyspnea presyncope or syncope.  She notes a rare episode of shortness of breath when she gets very upset.  She denies any exertional dyspnea.  She has continued to be on atenolol 50 mg daily, losartan 25 mg twice a day.  Apparently she has not been taking her rosuvastatin and actually  never got this prescription filled.  She underwent repeat laboratory in September 2019 by Joann Barry.  LDL cholesterol was 113.   A follow-up echo Doppler study on May 27, 2018  showed normal EF of 55 to 60% with only mildly increased wall thickness.  There was suggestion of grade 2 diastolic dysfunction.  Her left atrial size was moderately dilated.  Visually it appeared that her aortic stenosis was in the moderate to moderately severe range.  Mean gradient was 31 with a peak instantaneous gradient of 55.   She was evaluated by Joann Barry on March 22, 2019. She continues to be symptom-free. Her blood pressure was increased but reportedly her blood pressure at home had been normal. A blood pressure log was recommended.  She underwent a follow-up echo Doppler study on August 24, 2019 this showed an EF 55 to 60% with mild LVH and grade 1 diastolic dysfunction. There is mild dilation of her left atrium. There was mild mitral regurgitation. She was now felt to have severe aortic stenosis with a mean gradient of 40. Her peak instantaneous gradient was 66.3. Aortic valve area was 0.6 cm.  I  saw her on October 13, 2019.  At that time she again denied  any chest pain or any episodes of presyncope or syncope.  However, when I interrogated her further, she perhaps does note some slight shortness of breath which she states she gets when she is worrying but denies any definitive exertional symptoms. She had undergone thyroidectomy in January 2021 by Joann Barry.  She had not had recent laboratory and I recommended she undergo a complete set of laboratory including a follow-up lipoprotein a due to its association with aortic stenosis.  LP(a) was significantly increased previously at 243 and she had been started on PCSK9 inhibition with Repatha he was continuing to take rosuvastatin.  I also referred her to see Dr. Burt Barry to allow for discussion concerning possible future TAVR versus open surgery.  She ultimately  underwent definitive cardiac catheterization by me on November 11, 2019 which showed normal coronary arteries.  She had normal to mildly elevated right heart pressures with PA systolic at 38 and mean pressure 23.   She underwent aortic valve replacement by Dr. Gilford Raid on December 06, 2019 and received a 23 mm Edwards pericardial valve and she underwent supra coronary replacement of an ascending aortic aneurysm.  She last saw Dr. Cyndia Bent in follow-up in October and at that time her beta-blocker therapy was further increased due to elevated blood pressure and heart rate.  I last saw her on April 19, 2020.  At that time she felt well and denied any p chest pain or shortness of breath.  She has continued to be on low-dose losartan 25 mg, Toprol tartrate 50 mg twice a day for  blood pressure.  She is on Zoloft for anxiety.  She also has been on rosuvastatin 20 mg and Repatha in light of her previously markedly elevated LP(a).   A follow-up echo Doppler study on October 30, 2020 showed an EF of 60 to 65%.  Global longitudinal strain was normal.  She had normal diastolic parameters.  There was mild biatrial enlargement, mild mitral regurgitation, and her bioprosthetic aortic valve was well-seated with a mean gradient of 11 mmHg.  There was mild dilatation of her aortic root at 39 mm.  Presently, she continues to feel well.  She states her blood pressure at home typically runs in the 130s to 140 range but at times can be elevated in the 1 50-1 60 range.  Losartan 50 mg and metoprolol tartrate 50 mg twice a day.  She is on rosuvastatin 20 mg on Monday and Friday and is on Repatha 140 mg injection every 2 weeks.  She has a history of hypothyroidism on levothyroxine 112 mcg.  She continues to be on aspirin.  She presents for evaluation  Past Medical History:  Diagnosis Date   Anxiety    Aortic stenosis    documented on cardiac clearance note   Arthritis    Elevated lipoprotein A level 04/19/2019   Heart  murmur    Hypercholesterolemia    Hypertension    Personal history of colonic polyps    S/P thyroidectomy 03/30/2019   Thyroid nodule    Bilateral    Past Surgical History:  Procedure Laterality Date   AORTIC VALVE REPLACEMENT N/A 12/06/2019   Procedure: AORTIC VALVE REPLACEMENT (AVR);  Surgeon: Gaye Pollack, MD;  Location: [facility];  Service: Open Heart Surgery;  Laterality: N/A;   COLONOSCOPY  01/18/2014   PARTIAL HYSTERECTOMY  1980   POLYPECTOMY     REPLACEMENT ASCENDING AORTA N/A 12/06/2019   Procedure: REPLACEMENT OF ASCENDING AORTIC ANEURYSM;  Surgeon: Gaye Pollack, MD;  Location: [facility];  Service: Open Heart Surgery;  Laterality: N/A;  CIRC ARREST   RIGHT HEART CATH AND CORONARY ANGIOGRAPHY N/A 11/11/2019   Procedure: RIGHT HEART CATH AND CORONARY ANGIOGRAPHY;  Surgeon: [name], MD;  Location: [facility];  Service: Cardiovascular;  Laterality: N/A;   TEE WITHOUT CARDIOVERSION N/A 12/06/2019   Procedure: TRANSESOPHAGEAL ECHOCARDIOGRAM (TEE);  Surgeon: Gaye Pollack, MD;  Location: [facility];  Service: Open Heart Surgery;  Laterality: N/A;   THYROIDECTOMY  03/30/2019   THYROIDECTOMY N/A 03/30/2019   Procedure: TOTAL THYROIDECTOMY;  Surgeon: [name], MD;  Location: [facility];  Service: General;  Laterality: N/A;    Current Medications: Outpatient Medications Prior to Visit  Medication Sig Dispense Refill   aspirin EC 81 MG tablet Take 81 mg by mouth daily. Swallow whole.     cholecalciferol (VITAMIN D3) 25 MCG (1000 UNIT) tablet Take 1,000 Units by mouth in the morning and at bedtime.      metoprolol tartrate (LOPRESSOR) 50 MG tablet Take 1 tablet (50 mg total) by mouth 2 (two) times daily. 60 tablet 1   Multiple Vitamins-Minerals (CENTRUM SILVER 50+WOMEN PO) Take 1 tablet by mouth daily.      REPATHA SURECLICK 400 MG/ML SOAJ INJECT 140MG INTO THE SKIN EVERY 14 DAYS 2 mL 11   rosuvastatin (CRESTOR) 20 MG tablet TAKE 1 TABLET BY MOUTH IN  THE EVENING 90 tablet 3    sertraline (ZOLOFT) 50 MG tablet Take 50 mg by mouth daily.     losartan (COZAAR) 50 MG  tablet Take 1 tablet (50 mg total) by mouth daily. 90 tablet 3   levothyroxine (SYNTHROID) 112 MCG tablet Take 1 tablet (112 mcg total) by mouth daily. 30 tablet 11   fluticasone (FLONASE) 50 MCG/ACT nasal spray Place 2 sprays into both nostrils daily as needed for allergies.      zolpidem (AMBIEN) 10 MG tablet Take 5 mg by mouth at bedtime as needed for sleep.      No facility-administered medications prior to visit.     Allergies:   Sulfonamide derivatives   Social History   Socioeconomic History   Marital status: Married    Spouse name: Not on file   Number of children: Not on file   Years of education: Not on file   Highest education level: Not on file  Occupational History   Not on file  Tobacco Use   Smoking status: Never   Smokeless tobacco: Never  Vaping Use   Vaping Use: Never used  Substance and Sexual Activity   Alcohol use: No   Drug use: No   Sexual activity: Not on file  Other Topics Concern   Not on file  Social History Narrative   Not on file   Social Determinants of Health   Financial Resource Strain: Not on file  Food Insecurity: Not on file  Transportation Needs: Not on file  Physical Activity: Not on file  Stress: Not on file  Social Connections: Not on file    Additional social history is notable in that she is married for 50 years.  She has 5 children and 16 great and grandchildren.  She works for [employer].  She completed 12th grade of education.  There is no tobacco history or alcohol use.  Family History:  The patient's family history includes Pancreatic cancer in her mother.  Her mother died at 39 from pancreatic cancer.  Her father died at 18.  One brother had neurofibromatosis.  ROS General: Negative; No fevers, chills, or night sweats;  HEENT: Negative; No changes in vision or hearing, sinus congestion, difficulty swallowing Pulmonary: Negative;  No cough, wheezing, shortness of breath, hemoptysis Cardiovascular: See HPI GI: Negative; No nausea, vomiting, diarrhea, or abdominal pain GU: Negative; No dysuria, hematuria, or difficulty voiding Musculoskeletal: Positive for osteopenia Hematologic/Oncology: Negative; no easy bruising, bleeding Endocrine: Negative; no heat/cold intolerance; no diabetes Neuro: Negative; no changes in balance, headaches Skin: Negative; No rashes or skin lesions Psychiatric: Negative; No behavioral problems, depression Sleep: Negative; No snoring, daytime sleepiness, hypersomnolence, bruxism, restless legs, hypnogognic hallucinations, no cataplexy Other comprehensive 14 point system review is negative.   PHYSICAL EXAM:   VS:  BP (!) 160/67    Pulse (!) 57    Ht 5' 4.5" (1.638 m)    Wt 158 lb 9.6 oz (71.9 kg)    SpO2 98%    BMI 26.80 kg/m     Repeat blood pressure by me was 140/80  Wt Readings from Last 3 Encounters:  04/25/21 158 lb 9.6 oz (71.9 kg)  10/30/20 160 lb (72.6 kg)  04/19/20 155 lb 9.6 oz (70.6 kg)    General: Alert, oriented, no distress.  Skin: normal turgor, no rashes, warm and dry HEENT: Normocephalic, atraumatic. Pupils equal round and reactive to light; sclera anicteric; extraocular muscles intact;  Nose without nasal septal hypertrophy Mouth/Parynx benign; Mallinpatti scale 2 Neck: No JVD, no carotid bruits; normal carotid upstroke Lungs: clear to ausculatation and percussion; no wheezing or rales Chest wall: without tenderness to palpitation Heart: PMI  not displaced, RRR, s1 s2 normal, 0-7/8 systolic murmur, no diastolic murmur, no rubs, gallops, thrills, or heaves Abdomen: soft, nontender; no hepatosplenomehaly, BS+; abdominal aorta nontender and not dilated by palpation. Back: no CVA tenderness Pulses 2+ Musculoskeletal: full range of motion, normal strength, no joint deformities Extremities: no clubbing cyanosis or edema, Homan's sign negative  Neurologic: grossly nonfocal;  Cranial nerves grossly wnl Psychologic: Normal mood and affect   Studies/Labs Reviewed:   February 9. 2023 ECG (independently read by me):  Sius bradycardia. LVH, no ectopy  April 19, 2020 ECG (independently read by me): Normal sinus rhythm at 63 bpm, nonspecific T wave abnormality.  Normal intervals.  No ectopy.  July 2021 ECG (independently read by me): Normal sinus rhythm at 63 bpm.  No ectopy.  No LVH or strain.  July 2020 ECG (independently read by me): Sinus Bradycardia at 58; no ectopy, LVH or strain  September 2019 ECG (independently read by me): Normal sinus rhythm at 70 bpm.  No ectopy. No LVH or strain  February 2019 ECG (independently read by me): Sinus bradycardia 58 bpm.  PR interval 172 ms.  QTc interval 426 ms.  No ectopy.  October 2018 ECG (independently read by me): Normal sinus rhythm at 68 bpm.  Normal intervals.  No ST segment changes.  No ectopy.  Recent Labs: BMP Latest Ref Rng & Units 04/25/2021 04/24/2020 12/10/2019  Glucose 70 - 99 mg/dL 118(H) 112(H) 133(H)  BUN 8 - 27 mg/dL 23 22 13   Creatinine 0.57 - 1.00 mg/dL 0.80 0.80 0.73  BUN/Creat Ratio 12 - 28 29(H) 28 -  Sodium 134 - 144 mmol/L 141 141 137  Potassium 3.5 - 5.2 mmol/L 4.8 4.7 4.0  Chloride 96 - 106 mmol/L 102 104 102  CO2 20 - 29 mmol/L 25 23 27   Calcium 8.7 - 10.3 mg/dL 9.7 9.3 8.6(L)     Hepatic Function Latest Ref Rng & Units 04/25/2021 11/05/2020 04/24/2020  Total Protein 6.0 - 8.5 g/dL 7.0 6.7 6.6  Albumin 3.7 - 4.7 g/dL 4.4 4.2 4.2  AST 0 - 40 IU/L 17 17 23   ALT 0 - 32 IU/L 16 15 28   Alk Phosphatase 44 - 121 IU/L 73 72 73  Total Bilirubin 0.0 - 1.2 mg/dL 0.4 0.4 0.4  Bilirubin, Direct 0.00 - 0.40 mg/dL - 0.14 -    CBC Latest Ref Rng & Units 12/10/2019 12/08/2019 12/07/2019  WBC 4.0 - 10.5 K/uL 9.1 8.8 15.5(H)  Hemoglobin 12.0 - 15.0 g/dL 10.3(L) 9.3(L) 9.8(L)  Hematocrit 36.0 - 46.0 % 30.9(L) 28.4(L) 30.1(L)  Platelets 150 - 400 K/uL 146(L) 93(L) 115(L)   Lab Results  Component Value  Date   MCV 91.7 12/10/2019   MCV 93.4 12/08/2019   MCV 91.2 12/07/2019   Lab Results  Component Value Date   TSH 0.025 (L) Joann Barry/29/2021   Lab Results  Component Value Date   HGBA1C 5.6 12/02/2019     BNP No results found for: BNP  ProBNP No results found for: PROBNP   Lipid Panel     Component Value Date/Time   CHOL 103 04/25/2021 0919   TRIG 85 04/25/2021 0919   HDL 58 04/25/2021 0919   CHOLHDL 1.8 04/25/2021 0919   LDLCALC 28 04/25/2021 0919     RADIOLOGY: No results found.   Additional studies/ records that were reviewed today include:  I reviewed the medical records from [facility] including the echo Doppler study.  Office visits, ECG, chest x-ray, and laboratory.  Glucose 108,  BUN 13, Cr 0.8. We will leave that there letter daddies a creatinine of he wanted get this stuff so he does not stop right Cholesterol 187, triglycerides 105, HDL 44, LDL 122.  Non-HDL 43. TSH 0.21 Vitamin D 27.6  Laboratory at [facility] on December 14, 2017, LP(a) 180, BUN 16 creatinine 0.8.  Total cholesterol 182, triglycerides 111, HDL 47, LDL 113.  Non-HDL 135.  TSH 0.08.  Vitamin D 25.4.   ASSESSMENT:    1. Primary hypertension   2. S/P aortic valve replacement with bioprosthetic valve   3. Dilated aortic root ([place]): Status post supra coronary replacement of an ascending aortic aneurysm   4. Hyperlipidemia, unspecified hyperlipidemia type   5. Elevated lipoprotein(a)     PLAN:  Ms. Chole Driver is a very pleasant 76 year old female who was initally referred by Joann Barry for evaluation of cardiac murmur.  Her cardiac murmur was concordant with aortic valve stenosis and her initial echocardiographic evaluation in September 2018 demonstrated normal systolic function with grade 1 diastolic dysfunction and moderate aortic stenosis with a mean gradient of 28, peak instantaneous gradient to 45 mm Hg. in addition, there was poststenotic aortic root dilatation at 4.5 cm.   When I initially saw her, she was completely asymptomatic with reference to chest pain, PND, orthopnea, presyncope or syncope and did not have any signs or symptoms of CHF.  She was hypertensive.  With the addition of losartan 25 mg once daily her blood pressure has improved but remained elevated and subsequently was further titrated to 25 mg twice a day losartan was increased to 25 mg twice a day.  Since there is a reported increased incidence of increased LP(a) with aortic stenosis, I obtained an LP(a) evaluation which proved to be significantly elevated at 180. When subsequently seen her LDL cholesterol was 113.  I had recommended initiation of rosuvastatin which she never filled. At a subsequent office visit she agreed to initiate therapy and rosuvastatin was started at 10 mg and titrated up to 20 mg daily. In the future there will be specific therapy potentially aimed at reducing LP(a).  PCSK9 inhibitors have been shown to reduce this level by approximately 26%.  With a subsequent LP(a) level at 243, Repatha was initiated.  Her Aortic stenosis significantly progressed to a mean gradient of 40 and peak instantaneous gradient of 66.  She subsequently underwent catheterization which revealed normal coronary arteries.  On December 06, 2019 she underwent successful aortic valve replacement utilizing a 23 mm Edwards pericardial valve and supra coronary replacement of her ascending aortic aneurysm.  Subsequently she has done well and had required some medication titration for blood pressure and heart rate control.  Her most recent echo Doppler study from August 2022 was reviewed and continues to show excellent LV function with EF at 60 to 65% and normal diastolic parameters.  There is mild dilatation of his aortic root at 39 mm.  The bioprosthetic valve is well-seated with a mean gradient of 11.  There was mild mitral regurgitation.  Clinically she is doing well.  However with her blood pressure elevation I have  recommended further titration of losartan to 75 mg daily.  I also have recommended a follow-up comprehensive metabolic panel lipid studies and LP(a) on her current therapy of rosuvastatin, losartan,metoprolol and Repatha.  She is on levothyroxine 112 mcg for hypothyroidism.  She is on Zoloft for some anxiety.  She will be seeing Joann Barry for follow-up.  I will see her in 1  year for reevaluation or sooner as needed.    Medication Adjustments/Labs and Tests Ordered: Current medicines are reviewed at length with the patient today.  Concerns regarding medicines are outlined above.  Medication changes, Labs and Tests ordered today are listed in the Patient Instructions below. Patient Instructions  Medication Instructions:  INCREASE Losartan to 75 mg daily *If you need a refill on your cardiac medications before your next appointment, please call your pharmacy*  Lab Work: Your physician recommends that you return for lab work TODAY:  CMET Fasting Lipid Panel LP(a) If you have labs (blood work) drawn today and your tests are completely normal, you will receive your results only by: MyChart Message (if you have MyChart) OR A paper copy in the mail If you have any lab test that is abnormal or we need to change your treatment, we will call you to review the results.  Testing/Procedures: NONE ordered at this time of appointment   Follow-Up: At [facility], you and your health needs are our priority.  As part of our continuing mission to provide you with exceptional heart care, we have created designated Provider Care Teams.  These Care Teams include your primary Cardiologist (physician) and Advanced Practice Providers (APPs -  Physician Assistants and Nurse Practitioners) who all work together to provide you with the care you need, when you need it.  Your next appointment:   1 year(s)  The format for your next appointment:   In Person  Provider:   [name], MD    Other  Instructions     Signed, Joann Majestic, MD  04/29/2021 3:05 PM    [facility] [address] [place], [place], [place]  [address] Phone: [phone]

## 2021-04-25 NOTE — Patient Instructions (Signed)
Medication Instructions:  INCREASE Losartan to 75 mg daily *If you need a refill on your cardiac medications before your next appointment, please call your pharmacy*  Lab Work: Your physician recommends that you return for lab work TODAY:  CMET Fasting Lipid Panel LP(a) If you have labs (blood work) drawn today and your tests are completely normal, you will receive your results only by: MyChart Message (if you have MyChart) OR A paper copy in the mail If you have any lab test that is abnormal or we need to change your treatment, we will call you to review the results.  Testing/Procedures: NONE ordered at this time of appointment   Follow-Up: At [facility], you and your health needs are our priority.  As part of our continuing mission to provide you with exceptional heart care, we have created designated Provider Care Teams.  These Care Teams include your primary Cardiologist (physician) and Advanced Practice Providers (APPs -  Physician Assistants and Nurse Practitioners) who all work together to provide you with the care you need, when you need it.  Your next appointment:   1 year(s)  The format for your next appointment:   In Person  Provider:   [name], MD    Other Instructions

## 2021-04-26 LAB — COMPREHENSIVE METABOLIC PANEL
ALT: 16 IU/L (ref 0–32)
AST: 17 IU/L (ref 0–40)
Albumin/Globulin Ratio: 1.7 (ref 1.2–2.2)
Albumin: 4.4 g/dL (ref 3.7–4.7)
Alkaline Phosphatase: 73 IU/L (ref 44–121)
BUN/Creatinine Ratio: 29 — ABNORMAL HIGH (ref 12–28)
BUN: 23 mg/dL (ref 8–27)
Bilirubin Total: 0.4 mg/dL (ref 0.0–1.2)
CO2: 25 mmol/L (ref 20–29)
Calcium: 9.7 mg/dL (ref 8.7–10.3)
Chloride: 102 mmol/L (ref 96–106)
Creatinine, Ser: 0.8 mg/dL (ref 0.57–1.00)
Globulin, Total: 2.6 g/dL (ref 1.5–4.5)
Glucose: 118 mg/dL — ABNORMAL HIGH (ref 70–99)
Potassium: 4.8 mmol/L (ref 3.5–5.2)
Sodium: 141 mmol/L (ref 134–144)
Total Protein: 7 g/dL (ref 6.0–8.5)
eGFR: 76 mL/min/{1.73_m2} (ref >59)

## 2021-04-26 LAB — LIPID PANEL
Chol/HDL Ratio: 1.8 ratio (ref 0.0–4.4)
Cholesterol, Total: 103 mg/dL (ref 100–199)
HDL: 58 mg/dL (ref >39)
LDL Chol Calc (NIH): 28 mg/dL (ref 0–99)
Triglycerides: 85 mg/dL (ref 0–149)
VLDL Cholesterol Cal: 17 mg/dL (ref 5–40)

## 2021-04-26 LAB — LIPOPROTEIN A (LPA): Lipoprotein (a): 212.7 nmol/L — ABNORMAL HIGH (ref <75.0)

## 2021-05-09 ENCOUNTER — Encounter: Payer: Self-pay | Admitting: Cardiovascular Disease

## 2021-07-07 IMAGING — CT CT ANGIO CHEST
4 of 10 series · 15 of 36 positions shown · IV contrast (APPLIED)
Comparison: No priors.

CLINICAL DATA: 74-year-old female with history of severe aortic
stenosis. Preprocedural study prior to potential transcatheter
aortic valve replacement (TAVR) procedure.

EXAM:
CT ANGIOGRAPHY CHEST, ABDOMEN AND PELVIS
TECHNIQUE: Non-contrast CT of the chest was initially obtained.

[Series 6: best diast 74 % · axial · 0.39mm/px · z∈[+1051,+1188]mm · 5 of 516 slices shown]
[im 86/516  lung]
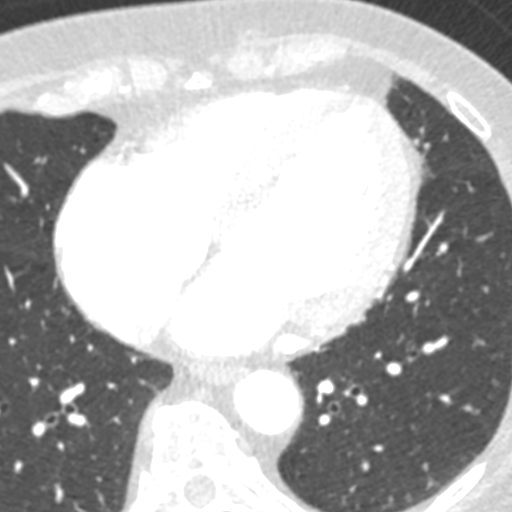
[im 172/516  lung]
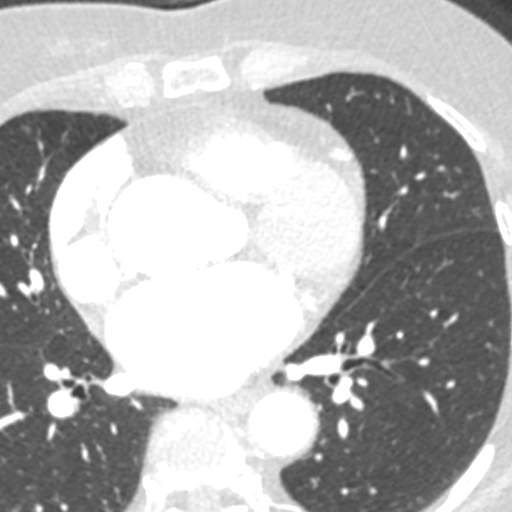
[im 258/516  lung]
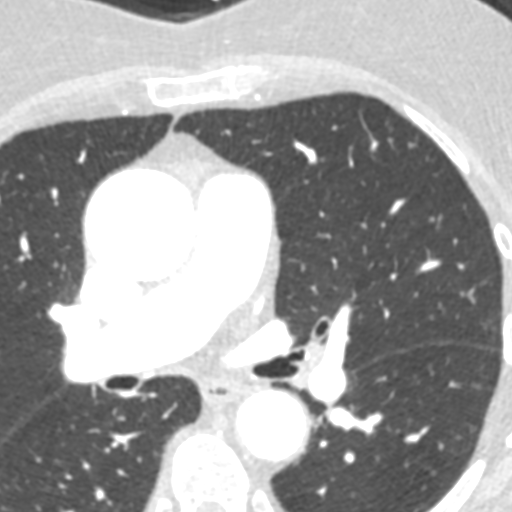
[im 344/516  lung]
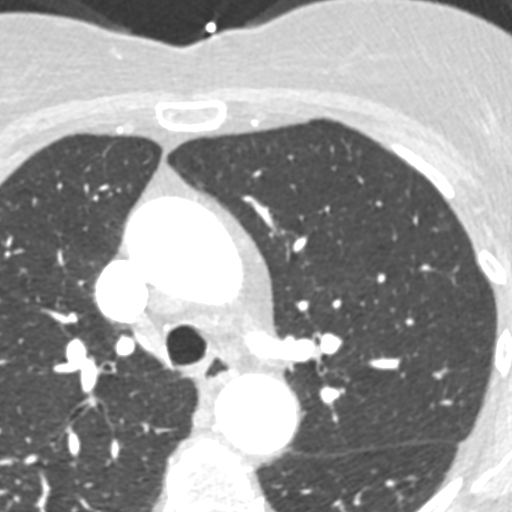
[im 430/516  lung]
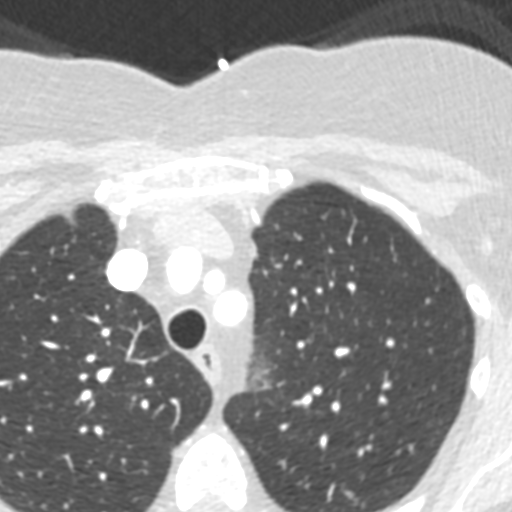

[Series 7: best syst 19 % · axial · 0.39mm/px · z∈[+1051,+1085]mm · 2 of 516 slices shown]
[im 86/516  lung]
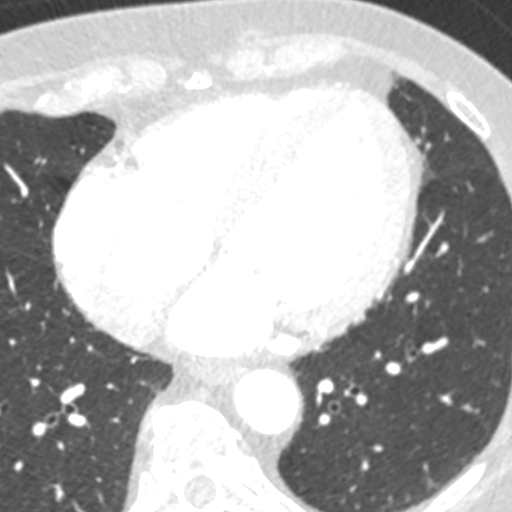
[im 172/516  lung]
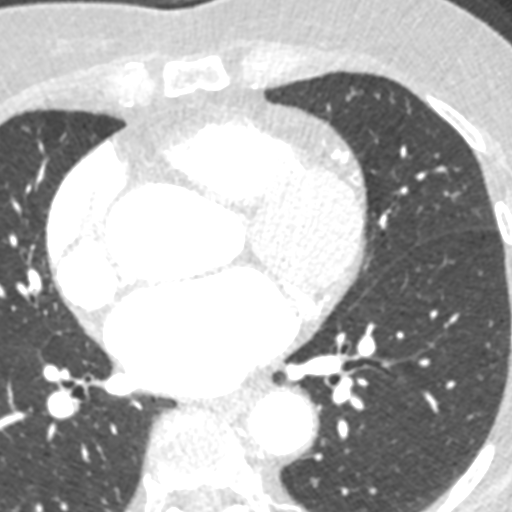

[Series 8: ax thins · axial · 0.59mm/px · z∈[+688,+1192]mm · 7 of 673 slices shown]
[im 85/673  lung]
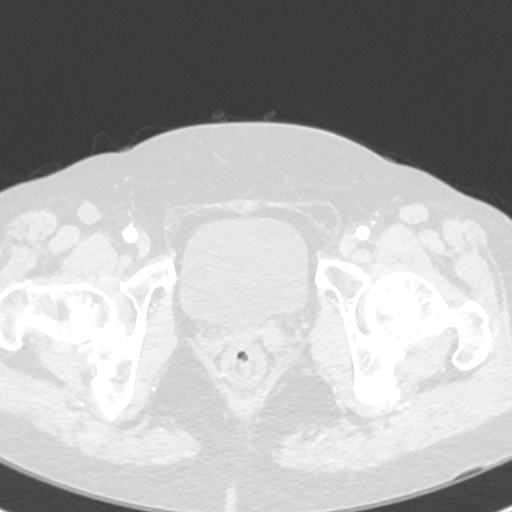
[im 169/673  mediastinal]
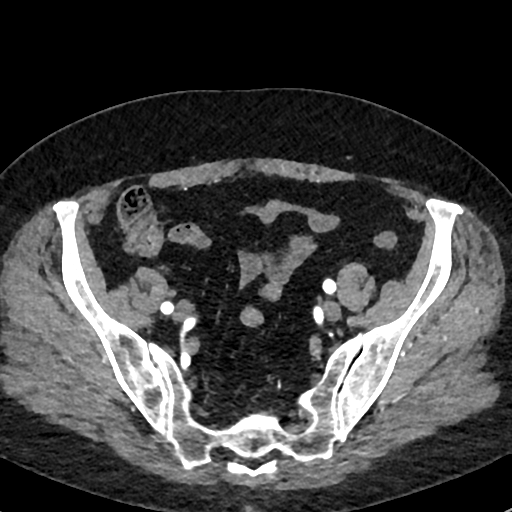
[im 253/673  lung]
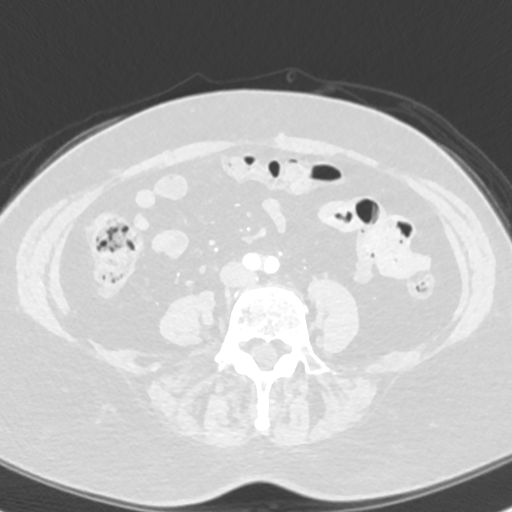
[im 337/673  mediastinal]
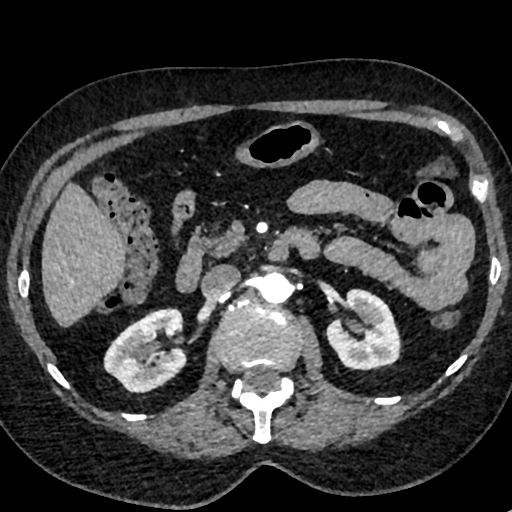
[im 421/673  lung]
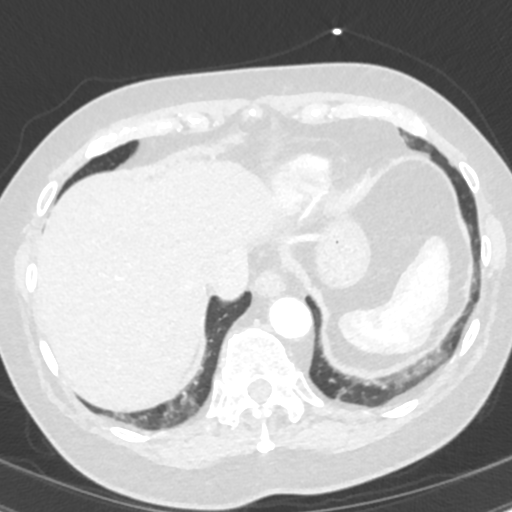
[im 505/673  mediastinal]
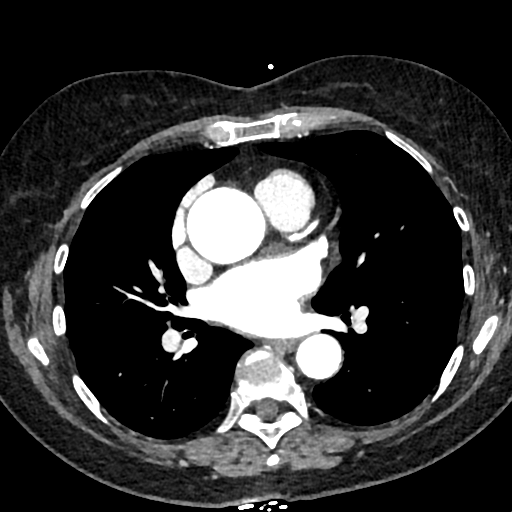
[im 589/673  lung]
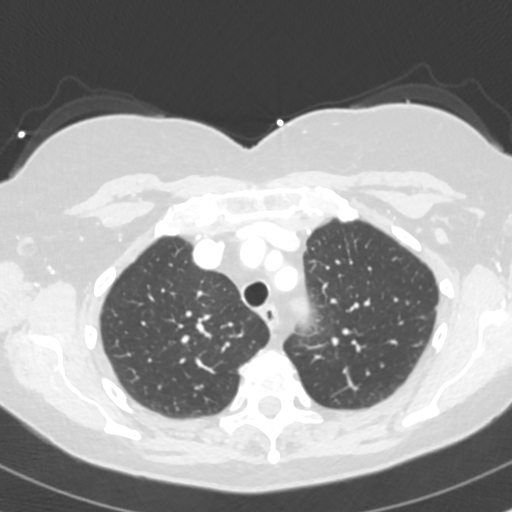

[Series 11: cor · coronal · 0.73mm/px · 1 of 139 slices shown]
[im 70/139  mediastinal]
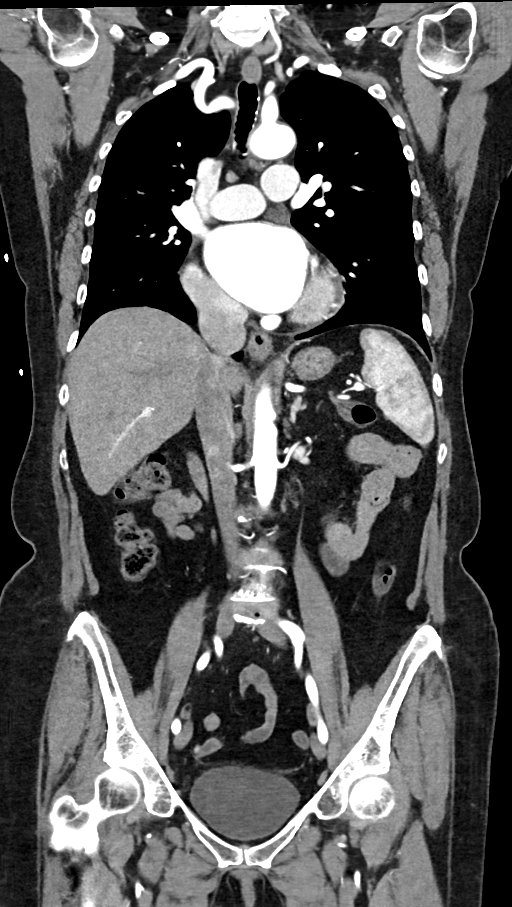

[15 of 36 positions shown; findings below may reference images not displayed]

Multidetector CT imaging through the chest, abdomen and pelvis was
performed using the standard protocol during bolus administration of
intravenous contrast. Multiplanar reconstructed images and MIPs were
obtained and reviewed to evaluate the vascular anatomy.

CONTRAST:  100mL OMNIPAQUE IOHEXOL 350 MG/ML SOLN
FINDINGS: CTA CHEST FINDINGS

Cardiovascular: Heart size is mildly enlarged. There is no
significant pericardial fluid, thickening or pericardial
calcification. There is aortic atherosclerosis, as well as
atherosclerosis of the great vessels of the mediastinum and the
coronary arteries, including calcified atherosclerotic plaque in the
left anterior descending coronary artery. Mild aneurysmal dilatation
of the ascending thoracic aorta (4.5 cm in diameter). Severe
thickening and calcification of the aortic valve.

Mediastinum/Lymph Nodes: No pathologically enlarged mediastinal or
hilar lymph nodes. Esophagus is unremarkable in appearance. No
axillary lymphadenopathy.

Lungs/Pleura: No suspicious appearing pulmonary nodules or masses
are noted. No acute consolidative airspace disease. No pleural
effusions.

Musculoskeletal/Soft Tissues: There are no aggressive appearing
lytic or blastic lesions noted in the visualized portions of the
skeleton.

CTA ABDOMEN AND PELVIS FINDINGS

Hepatobiliary: Small hypervascular areas in segment 2 (axial image
90 of series 9) and segment 4A/8 (axial image 87 of series 9)
measuring up to 1.3 x 0.8 cm. No intra or extrahepatic biliary
ductal dilatation. Gallbladder is normal in appearance.

Pancreas: No pancreatic mass. No pancreatic ductal dilatation. No
pancreatic or peripancreatic fluid collections or inflammatory
changes.

Spleen: Unremarkable.

Adrenals/Urinary Tract: Bilateral kidneys and adrenal glands are
normal in appearance. No hydroureteronephrosis. Urinary bladder is
normal in appearance.

Stomach/Bowel: The appearance of the stomach is normal. No
pathologic dilatation of small bowel or colon. Normal appendix.

Vascular/Lymphatic: Mild aortic atherosclerosis, without evidence of
aneurysm or dissection in the abdominal or pelvic vasculature.
Vascular findings and measurements pertinent to potential TAVR
procedure, as detailed below. No lymphadenopathy noted in the
abdomen or pelvis.

Reproductive: Status post hysterectomy. Ovaries are not confidently
identified may be surgically absent or atrophic.

Other: No significant volume of ascites.  No pneumoperitoneum.

Musculoskeletal: There are no aggressive appearing lytic or blastic
lesions noted in the visualized portions of the skeleton.

VASCULAR MEASUREMENTS PERTINENT TO TAVR:

AORTA:

Minimal Aortic 3iameter-97 x 14 mm

Severity of Aortic Calcification-none

RIGHT PELVIS:

Right Common Iliac Artery -

Minimal Giameter-I.I x 7.7 mm

Tortuosity - mild

Calcification-none

Right External Iliac Artery -

Minimal Niameter-4.7 x 6.4 mm

Tortuosity - mild

Calcification - none

Right Common Femoral Artery -

Minimal 7iameter-W.U x 6.6 mm

Tortuosity - mild

Calcification - none

LEFT PELVIS:

Left Common Iliac Artery -

Minimal 8iameter-J.J x 7.5 mm

Tortuosity - mild

Calcification - none

Left External Iliac Artery -

Minimal Siameter-2.H x 7.1 mm

Tortuosity - mild

Calcification - none

Left Common Femoral Artery -

Minimal 5iameter-C.V x 7.0 mm

Tortuosity-mild

Calcification - none

Review of the MIP images confirms the above findings.
IMPRESSION: 1. Vascular findings and measurements pertinent to potential TAVR
procedure, as detailed above.
2. Severe thickening calcification of the aortic valve, compatible
with the reported clinical history of severe aortic stenosis.
3. Aortic atherosclerosis, in addition to left anterior descending
coronary artery disease.
4. Mild aneurysmal dilatation of the ascending thoracic aorta (4.5
cm in diameter). Ascending thoracic aortic aneurysm. Recommend
semi-annual imaging followup by CTA or MRA and referral to
cardiothoracic surgery if not already obtained. This recommendation
follows 1535 ACCF/AHA/AATS/ACR/ASA/SCA/UMME/EDINSON/KUNDE/PARSHOROV Guidelines
for the Diagnosis and Management of Patients With Thoracic Aortic
Disease. Circulation. 1535; 121: E266-e369. Aortic aneurysm NOS
([id number])
5. Small hypervascular lesions in the liver favored to represent
benign perfusion anomalies. This would require abdominal MRI with
and without IV gadolinium for definitive characterization if of
clinical concern.

## 2021-07-07 IMAGING — CT CT HEART MORP W/ CTA COR W/ SCORE W/ CA W/CM &/OR W/O CM
1 series · 1 of 1 positions shown · non-contrast
Comparison: none

Addendum:
CLINICAL DATA: Aortic Stenosis

EXAM:
Cardiac TAVR CT
TECHNIQUE: The patient was scanned on a Siemens Force [REDACTED]ice scanner. A 120
kV retrospective scan was triggered in the ascending thoracic aorta
at 140 HU's. Gantry rotation speed was 250 msecs and collimation was
.6 mm. No beta blockade or nitro were given. The 3D data set was
reconstructed in 5% intervals of the R-R cycle. Systolic and
diastolic phases were analyzed on a dedicated work station using
MPR, MIP and VRT modes. The patient received 80 cc of contrast.

[Series 633: min/max · 0.11mm/px · 1 of 1 slices shown]
[im 1/1]
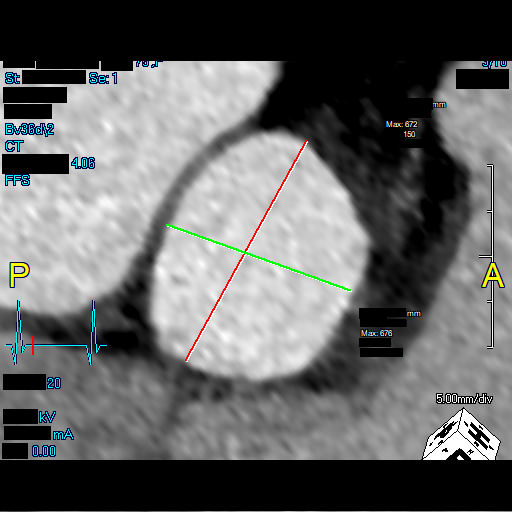

[1 of 1 positions shown; findings below may reference images not displayed]

FINDINGS: Aortic Valve: Functionally bicuspid with fused right and left cusps
calcified with restricted leaflet motion AV calcium score of 5315
suggesting severe AS

Aorta: Moderately dilated aortic root with normal arch vessels and
mild calcific atherosclerosis

[place]: 31 mm

Ascending Thoracic Aorta: 43 mm

Aortic Arch: 34 mm

Descending Thoracic Aorta: 26 mm

Sinus of Valsalva Measurements:

Commissural: 29.6 mm

Long Axis: 38.7 mm

Coronary Artery Height above Annulus:

Left Main: 14.65 mm above annulus

Right Coronary: 11.61 mm above annulus

Virtual Basal Annulus Measurements:

Maximum / Minimum Diameter: 27.8 mm x 21.5 mm

Perimeter: 80.4 mm

Area: 500.58 mm 2

Coronary Arteries: Sufficient height above annulus for deployment

Optimum Fluoroscopic Angle for Delivery: LAO 8 Caudal 5 degrees
IMPRESSION: 1. Bicuspid AV with calcium score of 5315 suggesting severe AS

2.  Annular area of 500 mm2 suitable for a 26 mm Sapien 3 valve

3.  Moderately dilated aortic root 4.3 cm

4.  Optimal angiographic angle for deployment LAO 8 Caudal 5 degrees

5.  Coronary arteries sufficient height above annulus for deployment

Panikos-Maria Larman

ADDENDUM:
Extracardiac findings were described separately under dictation for
contemporaneously obtained CTA chest, abdomen and pelvis dated
11/15/2019. Please see that report for full description of relevant
extracardiac findings.

*** End of Addendum ***
FINDINGS: Aortic Valve: Functionally bicuspid with fused right and left cusps
calcified with restricted leaflet motion AV calcium score of 5315
suggesting severe AS

Aorta: Moderately dilated aortic root with normal arch vessels and
mild calcific atherosclerosis

[place]: 31 mm

Ascending Thoracic Aorta: 43 mm

Aortic Arch: 34 mm

Descending Thoracic Aorta: 26 mm

Sinus of Valsalva Measurements:

Commissural: 29.6 mm

Long Axis: 38.7 mm

Coronary Artery Height above Annulus:

Left Main: 14.65 mm above annulus

Right Coronary: 11.61 mm above annulus

Virtual Basal Annulus Measurements:

Maximum / Minimum Diameter: 27.8 mm x 21.5 mm

Perimeter: 80.4 mm

Area: 500.58 mm 2

Coronary Arteries: Sufficient height above annulus for deployment

Optimum Fluoroscopic Angle for Delivery: LAO 8 Caudal 5 degrees
IMPRESSION: 1. Bicuspid AV with calcium score of 5315 suggesting severe AS

2.  Annular area of 500 mm2 suitable for a 26 mm Sapien 3 valve

3.  Moderately dilated aortic root 4.3 cm

4.  Optimal angiographic angle for deployment LAO 8 Caudal 5 degrees

5.  Coronary arteries sufficient height above annulus for deployment

Panikos-Maria Larman

## 2021-07-29 IMAGING — DX DG CHEST 1V PORT
1 series · 1 of 1 positions shown · non-contrast
Comparison: December 06, 2019

CLINICAL DATA: Status post aortic valve replacement. Status post
extubation

EXAM:
PORTABLE CHEST 1 VIEW

[chest]
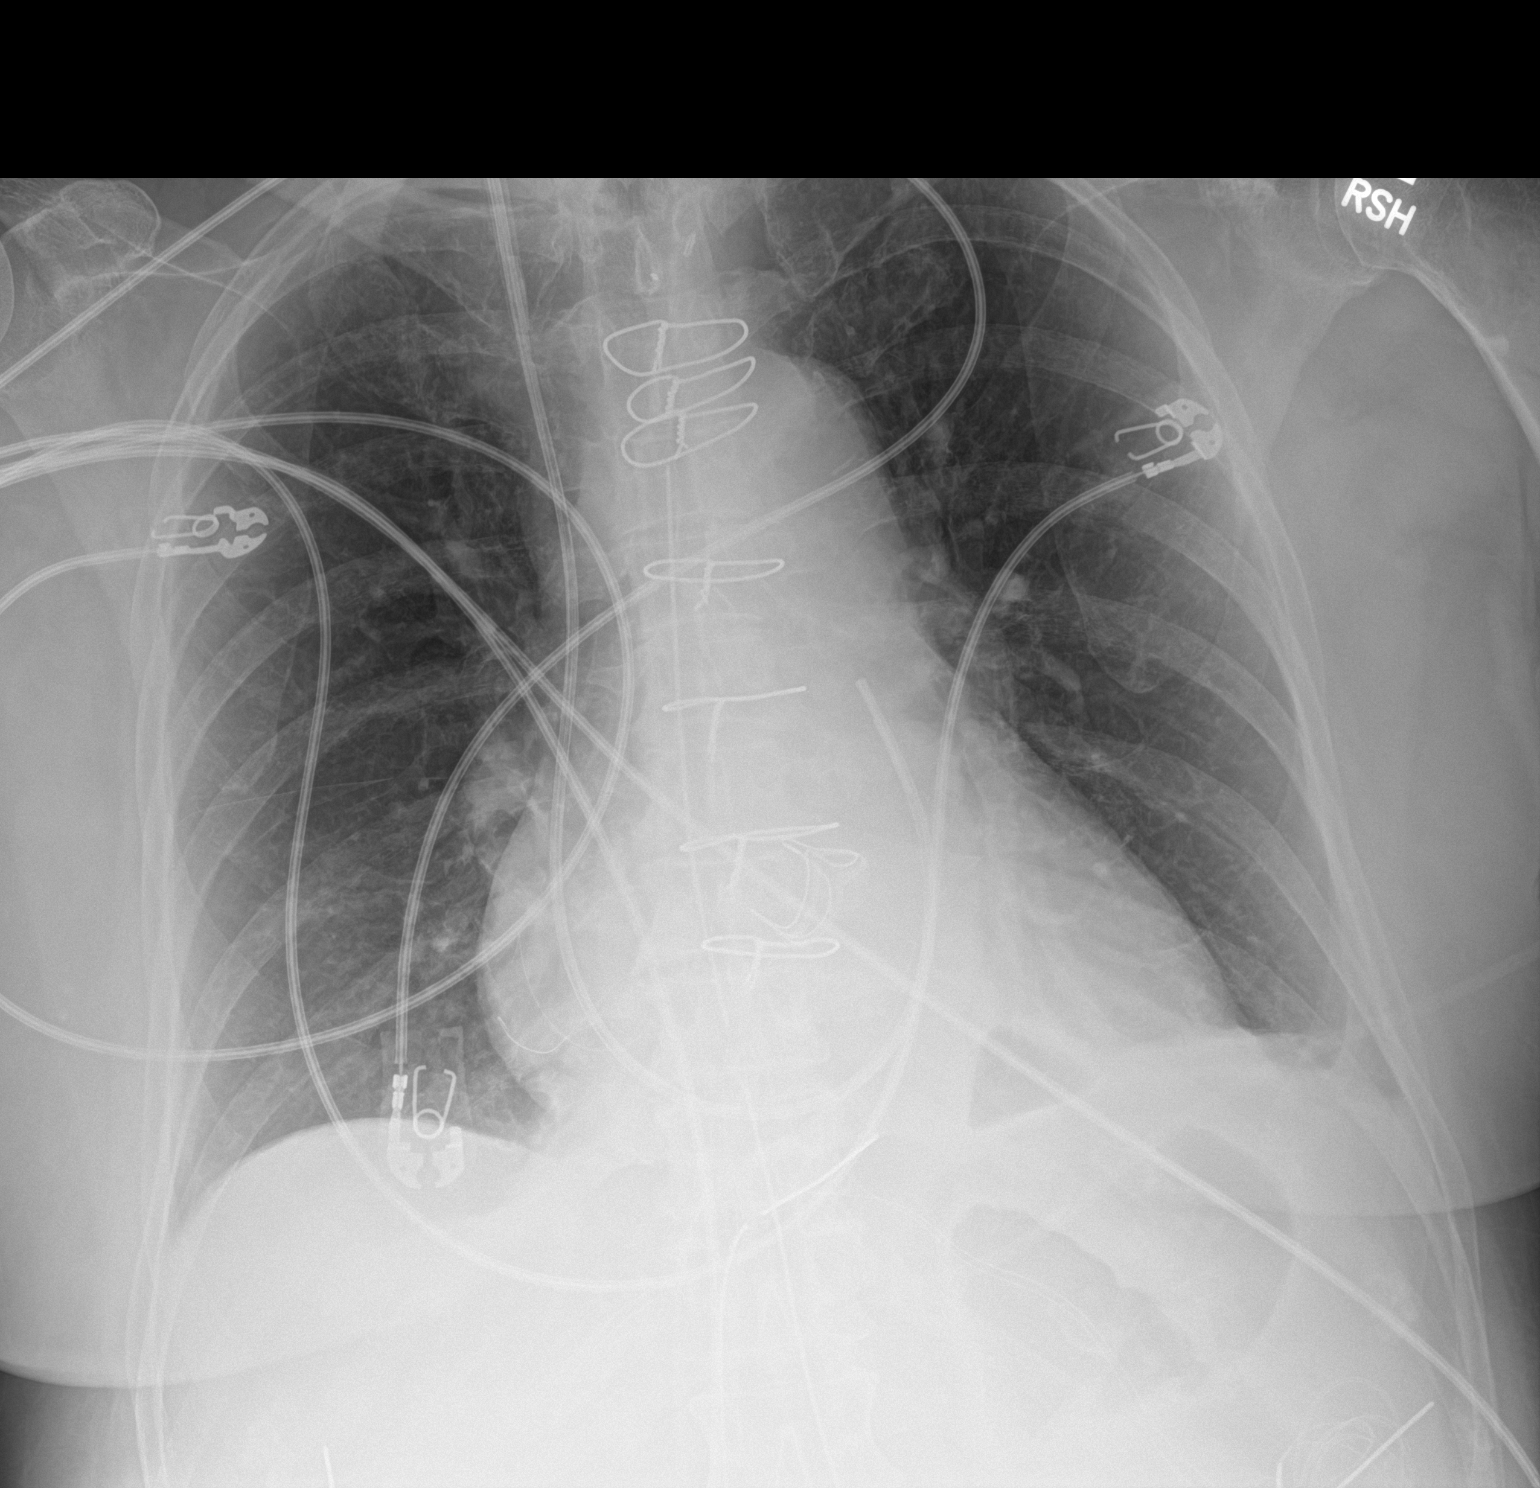

[1 of 1 positions shown; findings below may reference images not displayed]

FINDINGS: Endotracheal tube and nasogastric tube have been removed. Swan-Ganz
catheter tip is in the main pulmonary outflow tract. Left chest tube
and mediastinal drain unchanged in position. Temporary pacemaker
wires remain attached to the right heart. No pneumothorax. There is
atelectatic change in the left base with minimal left pleural
effusion. Lungs elsewhere clear. Heart mildly enlarged with
pulmonary vascularity normal. No adenopathy. Evidence of previous
thyroidectomy.
IMPRESSION: Tube and catheter positions as described without pneumothorax.
Atelectatic change left base with small left pleural effusion. Lungs
elsewhere clear. Stable cardiac prominence.

## 2021-08-26 IMAGING — DX DG CHEST 2V
2 series · 2 of 2 positions shown · non-contrast
Comparison: December 10, 2019.

CLINICAL DATA: Status post aortic valve repair.

EXAM:
CHEST - 2 VIEW

[dg chest 2 view (1 of 2)]
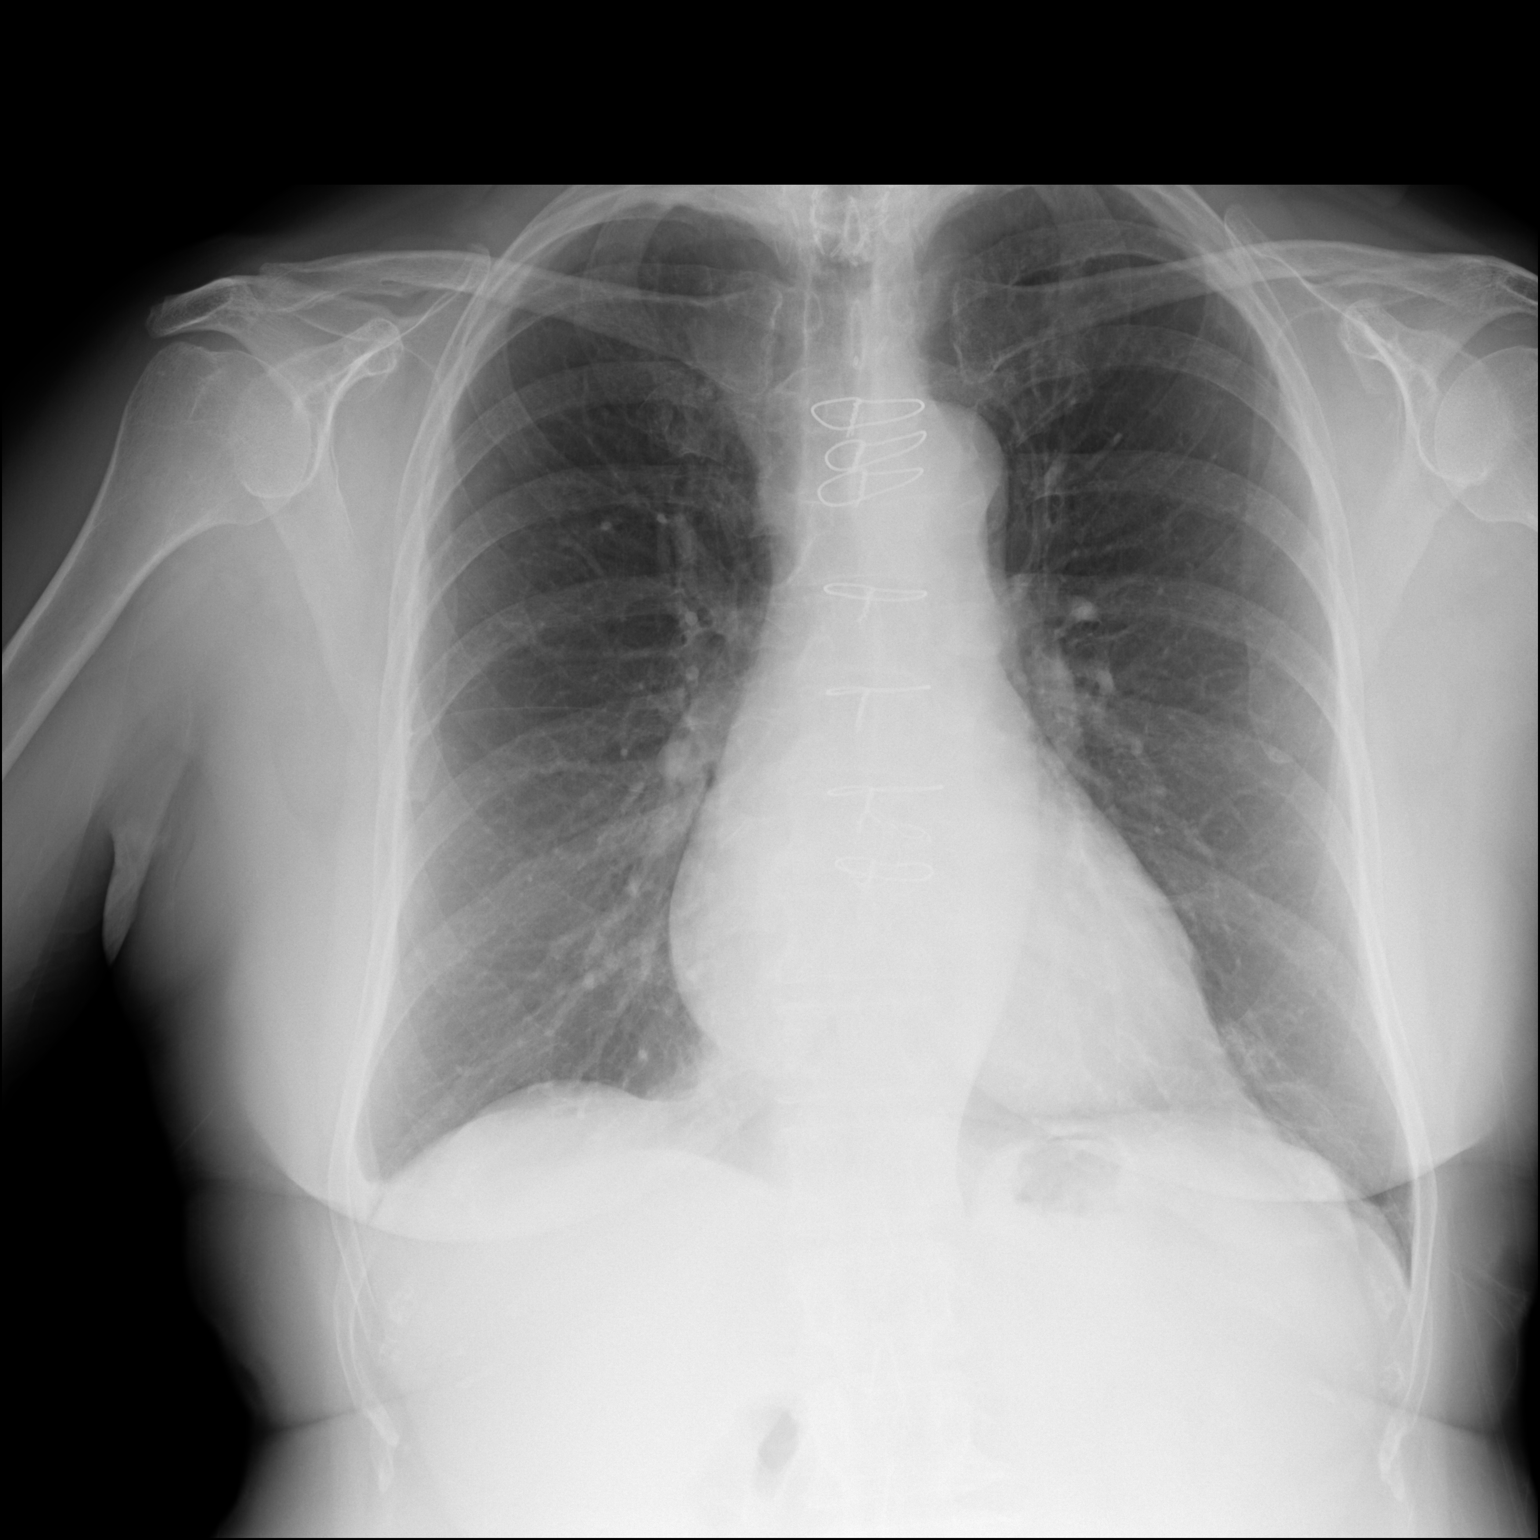

[dg chest 2 view (2 of 2)]
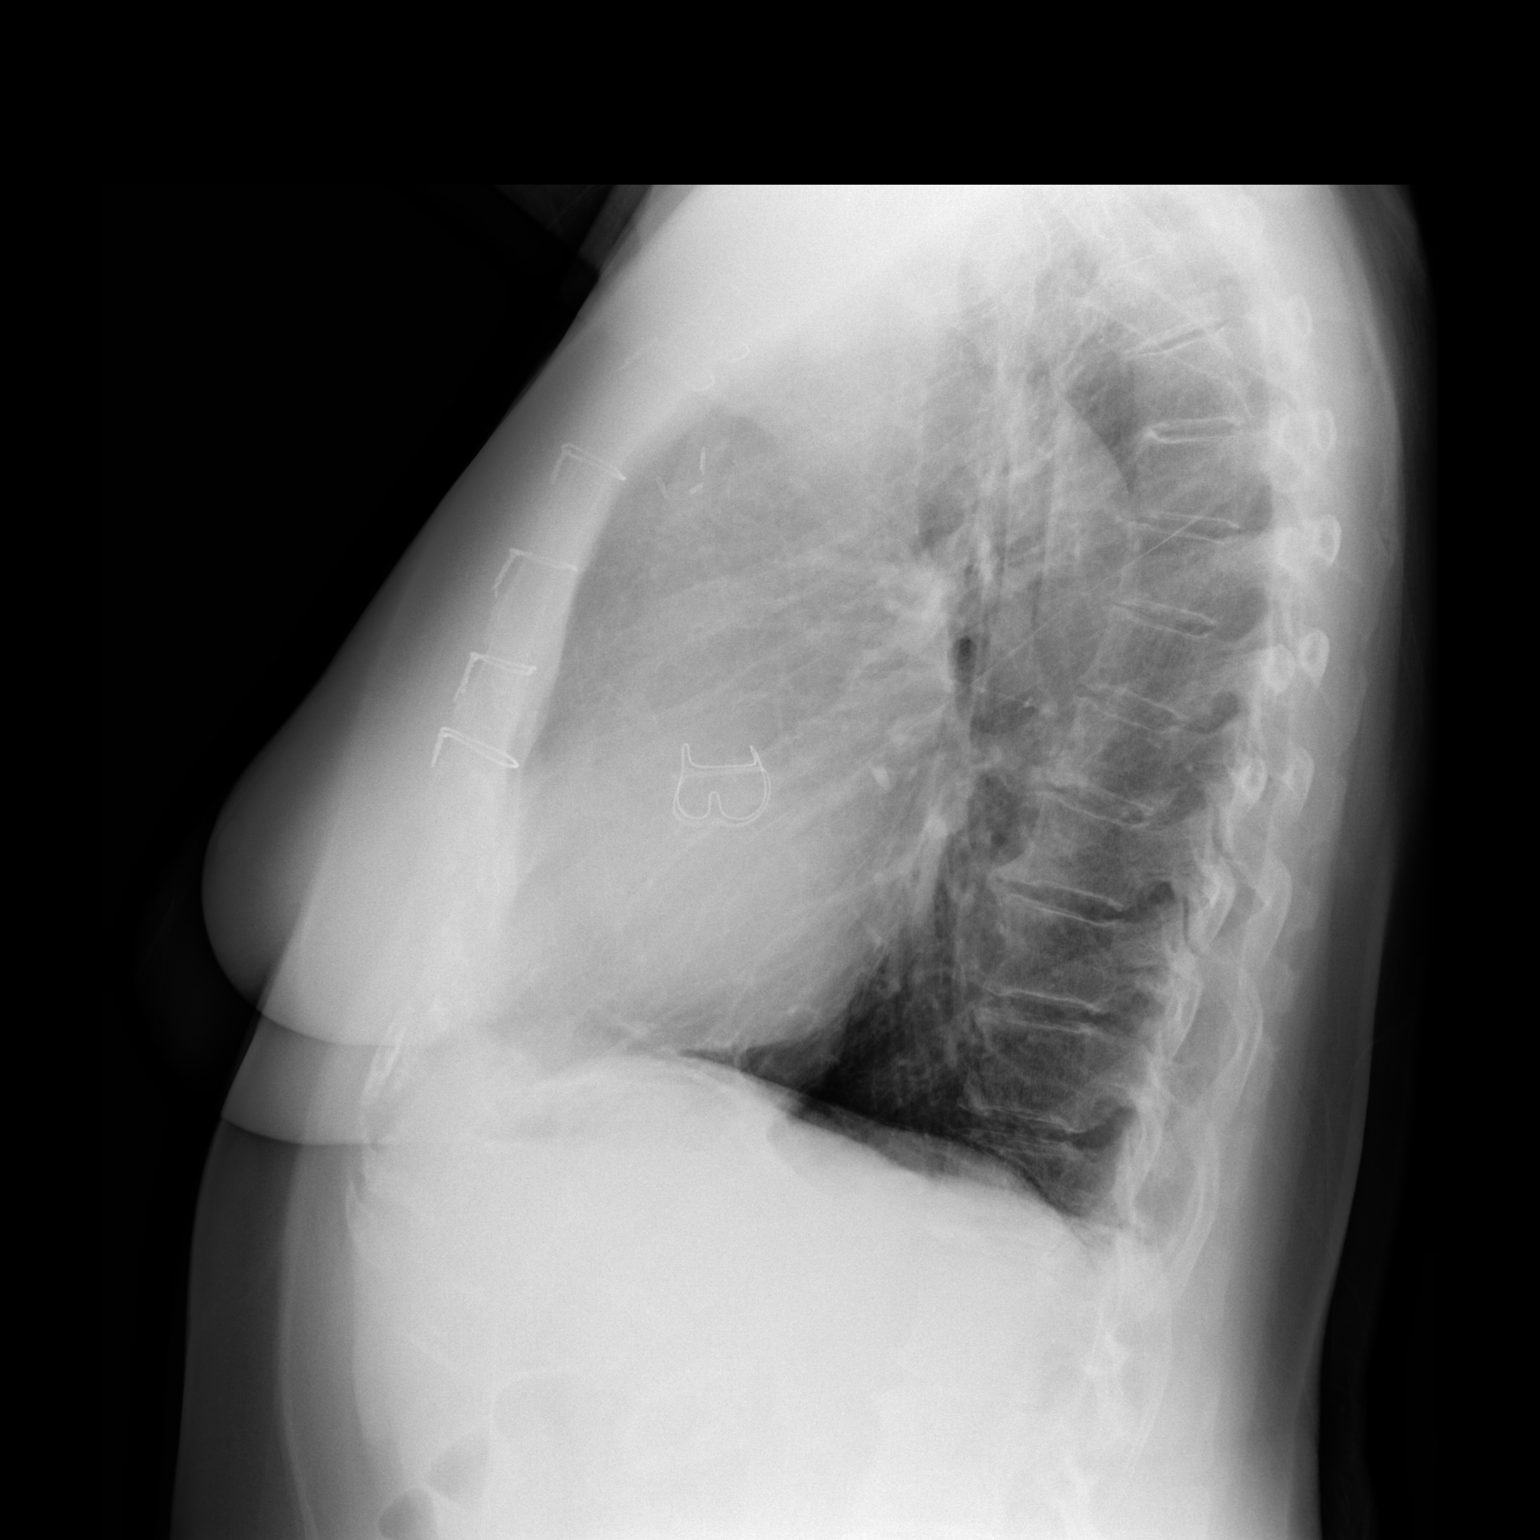

[2 of 2 positions shown; findings below may reference images not displayed]

FINDINGS: The heart size and mediastinal contours are within normal limits.
Both lungs are clear. No pneumothorax or pleural effusion is noted.
The visualized skeletal structures are unremarkable.
IMPRESSION: No active cardiopulmonary disease.

## 2021-10-29 ENCOUNTER — Encounter: Payer: Self-pay | Admitting: Cardiovascular Disease

## 2021-10-29 NOTE — Telephone Encounter (Signed)
Scheduled patient for 12 month f/u Apr 28, 2022.

## 2022-01-20 ENCOUNTER — Other Ambulatory Visit: Payer: Self-pay | Admitting: Cardiovascular Disease

## 2022-02-14 ENCOUNTER — Encounter: Payer: Self-pay | Admitting: Cardiovascular Disease

## 2022-02-18 ENCOUNTER — Telehealth: Payer: Self-pay | Admitting: Cardiovascular Disease

## 2022-02-18 NOTE — Telephone Encounter (Signed)
Pt c/o medication issue:  1. Name of Medication:   Evolocumab (REPATHA SURECLICK) 753 MG/ML SOAJ    2. How are you currently taking this medication (dosage and times per day)?   INJECT '140MG'$  INTO THE SKIN EVERY 14 DAYS    3. Are you having a reaction (difficulty breathing--STAT)? No  4. What is your medication issue? Pharmacy needing verbal approval for replacement for medication. Call transferred

## 2022-02-18 NOTE — Telephone Encounter (Signed)
[facility] called to ask for approval for a replacement repatha due to a defect. Call [phone].

## 2022-02-19 NOTE — Telephone Encounter (Signed)
Verbal order provided to [facility] pharmacy to replace single Repatha pen.

## 2022-04-03 ENCOUNTER — Other Ambulatory Visit (HOSPITAL_COMMUNITY): Payer: Self-pay

## 2022-04-22 ENCOUNTER — Telehealth: Payer: Self-pay | Admitting: Cardiovascular Disease

## 2022-04-22 NOTE — Telephone Encounter (Signed)
Re enrolled in [facility] patient ID :[id number]  ID [id number]   CARD STATUS Active   BIN [id number]   PCN [id number]   PC GROUP [id number]  Info shared with [place]. Prescription cost is now $0.

## 2022-04-22 NOTE — Telephone Encounter (Signed)
Pt c/o medication issue:  1. Name of Medication:  Evolocumab (REPATHA SURECLICK) 151 MG/ML SOAJ   2. How are you currently taking this medication (dosage and times per day)? As prescribed  3. Are you having a reaction (difficulty breathing--STAT)? No   4. What is your medication issue? Patient is calling stating the pharmacy advised her it would cost $48 for this medication due to her grant expiring. Due to this the patient did not pick up the medication. Please advise.

## 2022-04-26 ENCOUNTER — Other Ambulatory Visit: Payer: Self-pay | Admitting: Cardiovascular Disease

## 2022-04-28 ENCOUNTER — Encounter: Payer: Self-pay | Admitting: Cardiovascular Disease

## 2022-04-28 ENCOUNTER — Ambulatory Visit: Payer: Medicare Other | Attending: Cardiovascular Disease | Admitting: Cardiovascular Disease

## 2022-04-28 DIAGNOSIS — E7841 Elevated Lipoprotein(a): Secondary | ICD-10-CM | POA: Diagnosis not present

## 2022-04-28 DIAGNOSIS — I77810 Thoracic aortic ectasia: Secondary | ICD-10-CM

## 2022-04-28 DIAGNOSIS — E785 Hyperlipidemia, unspecified: Secondary | ICD-10-CM

## 2022-04-28 DIAGNOSIS — Z79899 Other long term (current) drug therapy: Secondary | ICD-10-CM

## 2022-04-28 DIAGNOSIS — I10 Essential (primary) hypertension: Secondary | ICD-10-CM | POA: Diagnosis not present

## 2022-04-28 DIAGNOSIS — Z953 Presence of xenogenic heart valve: Secondary | ICD-10-CM | POA: Diagnosis not present

## 2022-04-28 LAB — CBC

## 2022-04-28 MED ORDER — LOSARTAN POTASSIUM 100 MG PO TABS: 100.0000 mg | ORAL_TABLET | Freq: Every day | ORAL | 3 refills | Status: DC

## 2022-04-28 NOTE — Progress Notes (Signed)
Cardiology Office Note    Date:  04/28/2022   ID:  Amel, Hagens [birth year], MRN [id number]  PCP:  [name], MD  Cardiologist:  [name], MD   No chief complaint on file.  12 month F/U evaluation, initially referred through the courtesy of Dr. [name] for evaluation of aortic stenosis.  History of Present Illness:  Joann Barry is a 77 y.o. female who I had seen in October 2016 when she was referred by [name] for evaluation of aortic stenosis. I last saw her in February 2023.  She presents for a 1 year follow-up evaluation.  Ms. Liaw has a >  30 year history of hypertension and has been on atenolol 50 mg daily.  She also has a history of osteopenia, anxiety, and GERD.  She had recently seen [name] in because of a systolic cardiac murmur.  She was referred for echo Doppler study which was done on 12/02/2016.  This showed an EF of 55-60%.  She had normal wall motion with grade 1 diastolic dysfunction.  Aortic valve was moderately calcified and functionally bicuspid.  Valve motion was restricted.  She was felt to have moderate aortic stenosis with trivial AR.  Her mean gradient was 28, and peak instantaneous gradient 45 mm.  Her ascending aorta was mildly dilated at 4.5 cm..  There was mild MR, and there was mild left atrial dilatation.  When I initially saw her, she denied any episodes of chest pain or shortness of breath. .  She denied any presyncope or syncope and during periods of anxiety had noticed some mild increased respirations.  When I saw her, since she was asymptomatic.  I recommended close surveillance.  She was hypertensive and had grade 1 diastolic dysfunction and added low-dose losartan 25 mg daily to take with her previously atenolol regimen.  Since her initial evaluation, she has continued to be entirely asymptomatic.  There is only rare shortness of breath when she gets upset.  There is no change in exertional capacity.  She walks.   She denies palpitations.  She underwent a follow-up echo Doppler study on 05/01/2017.  This had shown some progression of her aortic stenosis such that her mean gradient had increased to 37 mm with a peak instantaneous gradient of 62 mm.  Calculated valve area was 0.8 cm.  There was grade 2 diastolic dysfunction.    When I saw her on May 12, 2017, she  continued to remain asymptomatic.  She specifically denied chest pain, presyncope or syncope, or change in exercise tolerance.  I had recommended that when she had laboratory done with [name] office that they obtain an LP(a) since an increased level has been associated with aortic stenosis.  Her LP(a) level came back elevated at 180.  She has not been on any antilipid therapy.  TC was 182, triglycerides 111, HDL 47, LDL 113.  Her TSH was 0.08 which is low.  She underwent a six-month follow-up echo Doppler study on October 21, 2017.  This essentially was unchanged and showed an EF of 60 to 65% with grade 2 diastolic dysfunction.  Her aortic valve is functionally bicuspid with fusion of the right and left coronary cusps and is calcified.  Her mean gradient was 34 with a peak gradient of 61.  Valve area was 0.73 cm.  Aortic root was mildly dilated with acsending aorta at 44 mm.  PA pressure was 33.    When I  saw her in September 2019 I reviewed her echo Doppler data.  I recommended initiation of Crestor 20 mg.  Her ECG did not show evidence for LV strain or LVH.  I again had a long discussion regarding aortic stenosis and she remained completely asymptomatic.  I saw her in July 2020 and she continued to be entirely asymptomatic.  She denied chest pain PND orthopnea exertional dyspnea presyncope or syncope.  She notes a rare episode of shortness of breath when she gets very upset.  She denies any exertional dyspnea.  She has continued to be on atenolol 50 mg daily, losartan 25 mg twice a day.  Apparently she has not been taking her rosuvastatin and  actually never got this prescription filled.  She underwent repeat laboratory in September 2019 by [name].  LDL cholesterol was 113.   A follow-up echo Doppler study on May 27, 2018  showed normal EF of 55 to 60% with only mildly increased wall thickness.  There was suggestion of grade 2 diastolic dysfunction.  Her left atrial size was moderately dilated.  Visually it appeared that her aortic stenosis was in the moderate to moderately severe range.  Mean gradient was 31 with a peak instantaneous gradient of 55.   She was evaluated by [name] on March 22, 2019. She continues to be symptom-free. Her blood pressure was increased but reportedly her blood pressure at home had been normal. A blood pressure log was recommended.  She underwent a follow-up echo Doppler study on August 24, 2019 this showed an EF 55 to 60% with mild LVH and grade 1 diastolic dysfunction. There is mild dilation of her left atrium. There was mild mitral regurgitation. She was now felt to have severe aortic stenosis with a mean gradient of 40. Her peak instantaneous gradient was 66.3. Aortic valve area was 0.6 cm.  I  saw her on October 13, 2019.  At that time she again denied  any chest pain or any episodes of presyncope or syncope.  However, when I interrogated her further, she perhaps does note some slight shortness of breath which she states she gets when she is worrying but denies any definitive exertional symptoms. She had undergone thyroidectomy in January 2021 by [name].  She had not had recent laboratory and I recommended she undergo a complete set of laboratory including a follow-up lipoprotein a due to its association with aortic stenosis.  LP(a) was significantly increased previously at 243 and she had been started on PCSK9 inhibition with Repatha he was continuing to take rosuvastatin.  I also referred her to see Dr. [name] to allow for discussion concerning possible future TAVR versus open surgery.  She ultimately  underwent definitive cardiac catheterization by me on November 11, 2019 which showed normal coronary arteries.  She had normal to mildly elevated right heart pressures with PA systolic at 38 and mean pressure 23.   She underwent aortic valve replacement by Dr. Gilford Raid on December 06, 2019 and received a 23 mm Edwards pericardial valve and she underwent supra coronary replacement of an ascending aortic aneurysm.  She last saw Dr. Cyndia Bent in follow-up in October and at that time her beta-blocker therapy was further increased due to elevated blood pressure and heart rate.  I last saw her on April 19, 2020.  At that time she felt well and denied any p chest pain or shortness of breath.  She has continued to be on low-dose losartan 25 mg, Toprol tartrate 50 mg  twice a day for blood pressure.  She is on Zoloft for anxiety.  She also has been on rosuvastatin 20 mg and Repatha in light of her previously markedly elevated LP(a).   A follow-up echo Doppler study on October 30, 2020 showed an EF of 60 to 65%.  Global longitudinal strain was normal.  She had normal diastolic parameters.  There was mild biatrial enlargement, mild mitral regurgitation, and her bioprosthetic aortic valve was well-seated with a mean gradient of 11 mmHg.  There was mild dilatation of her aortic root at 39 mm.  I last saw her on April 25, 2021 at which time she continued to feel well. She states her blood pressure at home typically runs in the 130s to 140 range but at times can be elevated in the 150-160 range.  During that evaluation I recommended she increase losartan from 50 mg to 75 mg and she continues to be on metoprolol tartrate 50 mg twice a day.   She is on rosuvastatin 20 mg on Monday and Friday and is on Repatha 140 mg injection every 2 weeks. Lp(a)  was elevated initially at 243.  She has a history of hypothyroidism on levothyroxine 112 mcg.  She continues to be on aspirin.   Since I last saw her, she has continued to be  followed by [name] on a yearly basis.  Is only she is on losartan 75 mg daily and metoprolol tartrate 50 mg twice a day.  She states her blood pressure at home typically runs in the 130s over 70s.  She admits to consistent blood pressure elevation when she comes to the doctor's office.  She continues to be on levothyroxine 112 mcg daily for hypothyroidism.  She is on Repatha in addition to rosuvastatin 20 mg for hyperlipidemia and elevated LP(a).  Her last LP(a) was 73 on April 25, 2021.  Denies any chest pain or shortness of breath.  She is not as active as she had in the past.  She presents for yearly evaluation.  Past Medical History:  Diagnosis Date   Anxiety    Aortic stenosis    documented on cardiac clearance note   Arthritis    Elevated lipoprotein A level 04/19/2019   Heart murmur    Hypercholesterolemia    Hypertension    Personal history of colonic polyps    S/P thyroidectomy 03/30/2019   Thyroid nodule    Bilateral    Past Surgical History:  Procedure Laterality Date   AORTIC VALVE REPLACEMENT N/A 12/06/2019   Procedure: AORTIC VALVE REPLACEMENT (AVR);  Surgeon: Gaye Pollack, MD;  Location: [facility];  Service: Open Heart Surgery;  Laterality: N/A;   COLONOSCOPY  01/18/2014   PARTIAL HYSTERECTOMY  1980   POLYPECTOMY     REPLACEMENT ASCENDING AORTA N/A 12/06/2019   Procedure: REPLACEMENT OF ASCENDING AORTIC ANEURYSM;  Surgeon: Gaye Pollack, MD;  Location: [facility];  Service: Open Heart Surgery;  Laterality: N/A;  CIRC ARREST   RIGHT HEART CATH AND CORONARY ANGIOGRAPHY N/A 11/11/2019   Procedure: RIGHT HEART CATH AND CORONARY ANGIOGRAPHY;  Surgeon: [name], MD;  Location: [facility];  Service: Cardiovascular;  Laterality: N/A;   TEE WITHOUT CARDIOVERSION N/A 12/06/2019   Procedure: TRANSESOPHAGEAL ECHOCARDIOGRAM (TEE);  Surgeon: Gaye Pollack, MD;  Location: [facility];  Service: Open Heart Surgery;  Laterality: N/A;   THYROIDECTOMY  03/30/2019   THYROIDECTOMY  N/A 03/30/2019   Procedure: TOTAL THYROIDECTOMY;  Surgeon: [name], MD;  Location: [facility];  Service: General;  Laterality: N/A;    Current Medications: Outpatient Medications Prior to Visit  Medication Sig Dispense Refill   aspirin EC 81 MG tablet Take 81 mg by mouth daily. Swallow whole.     cholecalciferol (VITAMIN D3) 25 MCG (1000 UNIT) tablet Take 1,000 Units by mouth in the morning and at bedtime.      Evolocumab (REPATHA SURECLICK) [id number] MG/ML SOAJ INJECT 140MG INTO THE SKIN EVERY 14 DAYS 2 mL 11   levothyroxine (SYNTHROID) 112 MCG tablet Take 1 tablet (112 mcg total) by mouth daily. 30 tablet 11   metoprolol tartrate (LOPRESSOR) 50 MG tablet Take 1 tablet (50 mg total) by mouth 2 (two) times daily. 60 tablet 1   Multiple Vitamins-Minerals (CENTRUM SILVER 50+WOMEN PO) Take 1 tablet by mouth daily.      rosuvastatin (CRESTOR) 20 MG tablet TAKE 1 TABLET BY MOUTH IN  THE EVENING 90 tablet 3   losartan (COZAAR) 50 MG tablet Take 1.5 tablets (75 mg total) by mouth daily. 135 tablet 3   sertraline (ZOLOFT) 50 MG tablet Take 50 mg by mouth daily.     No facility-administered medications prior to visit.     Allergies:   Sulfonamide derivatives   Social History   Socioeconomic History   Marital status: Married    Spouse name: Not on file   Number of children: Not on file   Years of education: Not on file   Highest education level: Not on file  Occupational History   Not on file  Tobacco Use   Smoking status: Never   Smokeless tobacco: Never  Vaping Use   Vaping Use: Never used  Substance and Sexual Activity   Alcohol use: No   Drug use: No   Sexual activity: Not on file  Other Topics Concern   Not on file  Social History Narrative   Not on file   Social Determinants of Health   Financial Resource Strain: Not on file  Food Insecurity: Not on file  Transportation Needs: Not on file  Physical Activity: Not on file  Stress: Not on file  Social Connections: Not on  file    Additional social history is notable in that she is married for 50 years.  She has 5 children and 16 great and grandchildren.  She works for [employer].  She completed 12th grade of education.  There is no tobacco history or alcohol use.  Family History:  The patient's family history includes Pancreatic cancer in her mother.  Her mother died at 48 from pancreatic cancer.  Her father died at 19.  One brother had neurofibromatosis.  ROS General: Negative; No fevers, chills, or night sweats;  HEENT: Negative; No changes in vision or hearing, sinus congestion, difficulty swallowing Pulmonary: Negative; No cough, wheezing, shortness of breath, hemoptysis Cardiovascular: See HPI GI: Negative; No nausea, vomiting, diarrhea, or abdominal pain GU: Negative; No dysuria, hematuria, or difficulty voiding Musculoskeletal: Positive for osteopenia Hematologic/Oncology: Negative; no easy bruising, bleeding Endocrine: Negative; no heat/cold intolerance; no diabetes Neuro: Negative; no changes in balance, headaches Skin: Negative; No rashes or skin lesions Psychiatric: Negative; No behavioral problems, depression Sleep: Negative; No snoring, daytime sleepiness, hypersomnolence, bruxism, restless legs, hypnogognic hallucinations, no cataplexy Other comprehensive 14 point system review is negative.   PHYSICAL EXAM:   VS:  BP (!) 191/96 (BP Location: Left Arm, Patient Position: Sitting, Cuff Size: Normal)   Pulse 63   Ht 5' 4.5" (1.638 m)   Wt  163 lb (73.9 kg)   SpO2 97%   BMI 27.55 kg/m     Repeat blood pressure by me was slightly improved at 178/84.  Wt Readings from Last 3 Encounters:  04/28/22 163 lb (73.9 kg)  04/25/21 158 lb 9.6 oz (71.9 kg)  10/30/20 160 lb (72.6 kg)    General: Alert, oriented, no distress.  Skin: normal turgor, no rashes, warm and dry HEENT: Normocephalic, atraumatic. Pupils equal round and reactive to light; sclera anicteric; extraocular muscles intact;  Nose  without nasal septal hypertrophy Mouth/Parynx benign; Mallinpatti scale 2 Neck: No JVD, no carotid bruits; normal carotid upstroke Lungs: clear to ausculatation and percussion; no wheezing or rales Chest wall: without tenderness to palpitation Heart: PMI not displaced, RRR, s1 s2 normal, [id number] systolic murmur, no diastolic murmur, no rubs, gallops, thrills, or heaves Abdomen: soft, nontender; no hepatosplenomehaly, BS+; abdominal aorta nontender and not dilated by palpation. Back: no CVA tenderness Pulses 2+ Musculoskeletal: full range of motion, normal strength, no joint deformities Extremities: no clubbing cyanosis or edema, Homan's sign negative  Neurologic: grossly nonfocal; Cranial nerves grossly wnl Psychologic: Normal mood and affect     Studies/Labs Reviewed:   April 28, 2022 ECG (independently read by me): Normal sinus rhythm at 63 bpm.  No ectopy.  Normal intervals.  February 9. 2023 ECG (independently read by me):  Sius bradycardia. LVH, no ectopy  April 19, 2020 ECG (independently read by me): Normal sinus rhythm at 63 bpm, nonspecific T wave abnormality.  Normal intervals.  No ectopy.  July 2021 ECG (independently read by me): Normal sinus rhythm at 63 bpm.  No ectopy.  No LVH or strain.  July 2020 ECG (independently read by me): Sinus Bradycardia at 58; no ectopy, LVH or strain  September 2019 ECG (independently read by me): Normal sinus rhythm at 70 bpm.  No ectopy. No LVH or strain  February 2019 ECG (independently read by me): Sinus bradycardia 58 bpm.  PR interval 172 ms.  QTc interval 426 ms.  No ectopy.  October 2018 ECG (independently read by me): Normal sinus rhythm at 68 bpm.  Normal intervals.  No ST segment changes.  No ectopy.  Recent Labs:    Latest Ref Rng & Units 04/25/2021    9:19 AM 04/24/2020    8:56 AM 12/10/2019    2:04 AM  BMP  Glucose 70 - 99 mg/dL 118  112  133   BUN 8 - 27 mg/dL 23  22  13   $ Creatinine 0.57 - 1.00 mg/dL 0.80  0.80  0.73    BUN/Creat Ratio 12 - 28 29  28    $ Sodium 134 - 144 mmol/L 141  141  137   Potassium 3.5 - 5.2 mmol/L 4.8  4.7  4.0   Chloride 96 - 106 mmol/L 102  104  102   CO2 20 - 29 mmol/L 25  23  27   $ Calcium 8.7 - 10.3 mg/dL 9.7  9.3  8.6         Latest Ref Rng & Units 04/25/2021    9:19 AM 11/05/2020    9:34 AM 04/24/2020    8:56 AM  Hepatic Function  Total Protein 6.0 - 8.5 g/dL 7.0  6.7  6.6   Albumin 3.7 - 4.7 g/dL 4.4  4.2  4.2   AST 0 - 40 IU/L 17  17  23   $ ALT 0 - 32 IU/L 16  15  28   $ Alk Phosphatase 44 - 121  IU/L 73  72  73   Total Bilirubin 0.0 - 1.2 mg/dL 0.4  0.4  0.4   Bilirubin, Direct 0.00 - 0.40 mg/dL  0.14         Latest Ref Rng & Units 12/10/2019    2:04 AM 12/08/2019    3:36 AM 12/07/2019    4:29 PM  CBC  WBC 4.0 - 10.5 K/uL 9.1  8.8  15.5   Hemoglobin 12.0 - 15.0 g/dL 10.3  9.3  9.8   Hematocrit 36.0 - 46.0 % 30.9  28.4  30.1   Platelets 150 - 400 K/uL 146  93  115    Lab Results  Component Value Date   MCV 91.7 12/10/2019   MCV 93.4 12/08/2019   MCV 91.2 12/07/2019   Lab Results  Component Value Date   TSH 0.025 (L) 10/13/2019   Lab Results  Component Value Date   HGBA1C 5.6 12/02/2019     BNP No results found for: "BNP"  ProBNP No results found for: "PROBNP"   Lipid Panel     Component Value Date/Time   CHOL 103 04/25/2021 0919   TRIG 85 04/25/2021 0919   HDL 58 04/25/2021 0919   CHOLHDL 1.8 04/25/2021 0919   LDLCALC 28 04/25/2021 0919     RADIOLOGY: No results found.   Additional studies/ records that were reviewed today include:  I reviewed the medical records from [place] including the echo Doppler study.  Office visits, ECG, chest x-ray, and laboratory.  Glucose 108, BUN 13, Cr 0.8. We will leave that there letter daddies a creatinine of he wanted get this stuff so he does not stop right Cholesterol 187, triglycerides 105, HDL 44, LDL 122.  Non-HDL 43. TSH 0.21 Vitamin D 27.6  Laboratory at [facility] on  December 14, 2017, LP(a) 180, BUN 16 creatinine 0.8.  Total cholesterol 182, triglycerides 111, HDL 47, LDL 113.  Non-HDL 135.  TSH 0.08.  Vitamin D 25.4.   ASSESSMENT:    1. Primary hypertension   2. S/P aortic valve replacement with bioprosthetic valve   3. Dilated aortic root ([place]): Status post supra coronary replacement of an ascending aortic aneurysm   4. Elevated lipoprotein(a)   5. Hyperlipidemia, unspecified hyperlipidemia type   6. Medication management      PLAN:  Ms. Jamyah Peetz is a very pleasant 77 year-old female who was initally referred by [name] for evaluation of cardiac murmur.  Her cardiac murmur was concordant with aortic valve stenosis and her initial echocardiographic evaluation in September 2018 demonstrated normal systolic function with grade 1 diastolic dysfunction and moderate aortic stenosis with a mean gradient of 28, peak instantaneous gradient to 45 mm Hg. in addition, there was poststenotic aortic root dilatation at 4.5 cm.  When I initially saw her, she was completely asymptomatic with reference to chest pain, PND, orthopnea, presyncope or syncope and did not have any signs or symptoms of CHF.  She was hypertensive.  With the addition of losartan 25 mg once daily her blood pressure has improved but remained elevated and subsequently was further titrated to 25 mg twice a day losartan was increased to 25 mg twice a day.  Since there is a reported increased incidence of increased LP(a) with aortic stenosis, I obtained an LP(a) evaluation which proved to be significantly elevated at 180. When subsequently seen her LDL cholesterol was 113.  I had recommended initiation of rosuvastatin which she never filled. At a subsequent office visit she agreed  to initiate therapy and rosuvastatin was started at 10 mg and titrated up to 20 mg daily. In the future there will be specific therapy potentially aimed at reducing LP(a).  PCSK9 inhibitors have been shown to reduce this  level by approximately 26%.  With a subsequent LP(a) level at 243, Repatha was initiated.  Her Aortic stenosis significantly progressed to a mean gradient of 40 and peak instantaneous gradient of 66.  She underwent catheterization which revealed normal coronary arteries.  On December 06, 2019 she underwent successful aortic valve replacement utilizing a 23 mm Edwards pericardial valve and supra coronary replacement of her ascending aortic aneurysm.  Subsequently she has done well and had required some medication titration for blood pressure and heart rate control.  Her  echo Doppler study from showed excellent LV function with EF at 60 to 65% and normal diastolic parameters.  There is mild dilatation of his aortic root at 39 mm.  The bioprosthetic valve is well-seated with a mean gradient of 11.  There was mild mitral regurgitation.  Last office visit in February 2023 losartan was further titrated to 75 mg.  On arrival to the office today blood pressure was significantly elevated when checked by the nurse at 191/96.  On recheck by me this had improved but still remained elevated at 178/84.  I am recommending further titration of losartan to 100 mg daily.  She tells me blood pressures at home typically is in the [id number] systolic with diastolic in the [id number] range.  He continues to be on rosuvastatin and Repatha for hyperlipidemia in addition to elevated LP(a).  She is fasting today.  I will repeat a complete set of laboratory with a comprehensive metabolic panel, CBC, TSH, fasting lipid studies, and we will recheck her LP(a) to see if this is further reduced with Repatha which should result in a 26 to 30% LP(a) reduction.  She continues to be on levothyroxine 112 mcg for hypothyroidism.  She will monitor her blood pressure at home.  She has an appointment to see [name] in 2 months for follow-up evaluation.  If her blood pressure remains elevated additional medication management will be necessary.  In August 2024 I  am recommending she undergo a 2-year follow-up echo Doppler reassessment of her bioprosthetic aortic valve.  I will see her back in the office in [place] in follow-up of her echo study and further recommendations will be made at that time.     Medication Adjustments/Labs and Tests Ordered: Current medicines are reviewed at length with the patient today.  Concerns regarding medicines are outlined above.  Medication changes, Labs and Tests ordered today are listed in the Patient Instructions below. Patient Instructions  Medication Instructions:  Your physician has recommended you make the following change in your medication:  INCREASE: Losartan 14m daily  *If you need a refill on your cardiac medications before your next appointment, please call your pharmacy*   Lab Work: Your physician recommends that you have the following labs drawn today: CMET, CBC, TSH, Lipids and Lp(a) If you have labs (blood work) drawn today and your tests are completely normal, you will receive your results only by: MSchaller(if you have MyChart) OR A paper copy in the mail If you have any lab test that is abnormal or we need to change your treatment, we will call you to review the results.   Testing/Procedures: Your physician has requested that you have an echocardiogram in [place] is a painless test that uses  sound waves to create images of your heart. It provides your doctor with information about the size and shape of your heart and how well your heart's chambers and valves are working. This procedure takes approximately one hour. There are no restrictions for this procedure. Please do NOT wear cologne, perfume, aftershave, or lotions (deodorant is allowed). Please arrive 15 minutes prior to your appointment time.    Follow-Up: At [facility], you and your health needs are our priority.  As part of our continuing mission to provide you with exceptional heart care,  we have created designated Provider Care Teams.  These Care Teams include your primary Cardiologist (physician) and Advanced Practice Providers (APPs -  Physician Assistants and Nurse Practitioners) who all work together to provide you with the care you need, when you need it.  We recommend signing up for the patient portal called "MyChart".  Sign up information is provided on this After Visit Summary.  MyChart is used to connect with patients for Virtual Visits (Telemedicine).  Patients are able to view lab/test results, encounter notes, upcoming appointments, etc.  Non-urgent messages can be sent to your provider as well.   To learn more about what you can do with MyChart, go to [email or web address].    Your next appointment:   6 month(s) after her echo.   Provider:   [name], MD    Signed, [name], MD  04/28/2022 8:42 AM    [facility] [address], [place], [place], [place]  [address] Phone: [phone]

## 2022-04-28 NOTE — Patient Instructions (Signed)
Medication Instructions:  Your physician has recommended you make the following change in your medication:  INCREASE: Losartan 157m daily  *If you need a refill on your cardiac medications before your next appointment, please call your pharmacy*   Lab Work: Your physician recommends that you have the following labs drawn today: CMET, CBC, TSH, Lipids and Lp(a) If you have labs (blood work) drawn today and your tests are completely normal, you will receive your results only by: MRudy(if you have MyChart) OR A paper copy in the mail If you have any lab test that is abnormal or we need to change your treatment, we will call you to review the results.   Testing/Procedures: Your physician has requested that you have an echocardiogram in [place] Echocardiography is a painless test that uses sound waves to create images of your heart. It provides your doctor with information about the size and shape of your heart and how well your heart's chambers and valves are working. This procedure takes approximately one hour. There are no restrictions for this procedure. Please do NOT wear cologne, perfume, aftershave, or lotions (deodorant is allowed). Please arrive 15 minutes prior to your appointment time.    Follow-Up: [facility] your [facility] needs [facility] our [facility].  As part of our continuing mission to provide you with exceptional heart care, we have created designated Provider Care Teams.  These Care Teams include your primary Cardiologist (physician) and Advanced Practice Providers (APPs -  Physician Assistants and Nurse Practitioners) who all work together to provide you with the care you need, when you need it.  We recommend signing up for the patient portal called "MyChart".  Sign up information is provided on this After Visit Summary.  MyChart is used to connect with patients for Virtual Visits (Telemedicine).  Patients are able to view lab/test results, encounter  notes, upcoming appointments, etc.  Non-urgent messages can be sent to your provider as well.   To learn more about what you can do with MyChart, go to [email or web address]    Your next appointment:   6 month(s) after her echo.   Provider:   TShelva Majestic MD

## 2022-04-29 LAB — TSH: TSH: 0.039 u[IU]/mL — ABNORMAL LOW (ref 0.450–4.500)

## 2022-04-29 LAB — CBC
Hematocrit: 39.6 % (ref 34.0–46.6)
Hemoglobin: 13 g/dL (ref 11.1–15.9)
MCH: 29.3 pg (ref 26.6–33.0)
MCHC: 32.8 g/dL (ref 31.5–35.7)
MCV: 89 fL (ref 79–97)
Platelets: 235 10*3/uL (ref 150–450)
RBC: 4.43 x10E6/uL (ref 3.77–5.28)
RDW: 12.5 % (ref 11.7–15.4)
WBC: 6.9 10*3/uL (ref 3.4–10.8)

## 2022-04-29 LAB — COMPREHENSIVE METABOLIC PANEL
ALT: 15 IU/L (ref 0–32)
AST: 17 IU/L (ref 0–40)
Albumin/Globulin Ratio: 1.8 (ref 1.2–2.2)
Albumin: 4.2 g/dL (ref 3.8–4.8)
Alkaline Phosphatase: 74 IU/L (ref 44–121)
BUN/Creatinine Ratio: 26 (ref 12–28)
BUN: 19 mg/dL (ref 8–27)
Bilirubin Total: 0.4 mg/dL (ref 0.0–1.2)
CO2: 24 mmol/L (ref 20–29)
Calcium: 9.4 mg/dL (ref 8.7–10.3)
Chloride: 103 mmol/L (ref 96–106)
Creatinine, Ser: 0.73 mg/dL (ref 0.57–1.00)
Globulin, Total: 2.4 g/dL (ref 1.5–4.5)
Glucose: 156 mg/dL — ABNORMAL HIGH (ref 70–99)
Potassium: 4.4 mmol/L (ref 3.5–5.2)
Sodium: 140 mmol/L (ref 134–144)
Total Protein: 6.6 g/dL (ref 6.0–8.5)
eGFR: 85 mL/min/{1.73_m2} (ref >59)

## 2022-04-29 LAB — LIPID PANEL
Chol/HDL Ratio: 1.7 ratio (ref 0.0–4.4)
Cholesterol, Total: 96 mg/dL — ABNORMAL LOW (ref 100–199)
HDL: 57 mg/dL (ref >39)
LDL Chol Calc (NIH): 24 mg/dL (ref 0–99)
Triglycerides: 69 mg/dL (ref 0–149)
VLDL Cholesterol Cal: 15 mg/dL (ref 5–40)

## 2022-04-29 LAB — LIPOPROTEIN A (LPA): Lipoprotein (a): 223.2 nmol/L — ABNORMAL HIGH (ref <75.0)

## 2022-10-23 ENCOUNTER — Other Ambulatory Visit: Payer: Self-pay | Admitting: Cardiovascular Disease

## 2022-10-23 DIAGNOSIS — I77810 Thoracic aortic ectasia: Secondary | ICD-10-CM

## 2022-10-23 DIAGNOSIS — I10 Essential (primary) hypertension: Secondary | ICD-10-CM

## 2022-10-23 DIAGNOSIS — Z79899 Other long term (current) drug therapy: Secondary | ICD-10-CM

## 2022-10-23 DIAGNOSIS — E7841 Elevated Lipoprotein(a): Secondary | ICD-10-CM

## 2022-10-23 DIAGNOSIS — I359 Nonrheumatic aortic valve disorder, unspecified: Secondary | ICD-10-CM

## 2022-10-23 DIAGNOSIS — E785 Hyperlipidemia, unspecified: Secondary | ICD-10-CM

## 2022-10-23 DIAGNOSIS — Z953 Presence of xenogenic heart valve: Secondary | ICD-10-CM

## 2022-10-27 ENCOUNTER — Ambulatory Visit (HOSPITAL_COMMUNITY): Payer: Medicare Other | Attending: Cardiovascular Disease

## 2022-10-27 ENCOUNTER — Telehealth: Payer: Self-pay | Admitting: Cardiovascular Disease

## 2022-10-27 DIAGNOSIS — I359 Nonrheumatic aortic valve disorder, unspecified: Secondary | ICD-10-CM | POA: Diagnosis present

## 2022-10-27 LAB — ECHOCARDIOGRAM COMPLETE
AV Mean grad: 8 mmHg
AV Peak grad: 15.1 mmHg
Ao pk vel: 1.94 m/s
Area-P 1/2: 3.89 cm2
Calc EF: 66.4 %
MV M vel: 6.26 m/s
MV Peak grad: 156.5 mmHg
MV Vena cont: 0.2 cm
Radius: 0.2 cm
S' Lateral: 3.1 cm
Single Plane A2C EF: 70.4 %
Single Plane A4C EF: 64.4 %

## 2022-10-27 NOTE — Telephone Encounter (Signed)
Patient is requesting call back in regards to echo.

## 2022-10-27 NOTE — Telephone Encounter (Signed)
Patient was calling because she thought she had missed an appointment on 8/8. Advised she did not miss an appointment.

## 2022-12-04 ENCOUNTER — Encounter: Payer: Self-pay | Admitting: Cardiovascular Disease

## 2022-12-04 ENCOUNTER — Ambulatory Visit: Payer: Medicare Other | Attending: Cardiovascular Disease | Admitting: Cardiovascular Disease

## 2022-12-04 VITALS — BP 154/70 | HR 52 | Ht 64.0 in | Wt 154.0 lb

## 2022-12-04 DIAGNOSIS — Z953 Presence of xenogenic heart valve: Secondary | ICD-10-CM

## 2022-12-04 DIAGNOSIS — E7800 Pure hypercholesterolemia, unspecified: Secondary | ICD-10-CM | POA: Diagnosis not present

## 2022-12-04 DIAGNOSIS — I77810 Thoracic aortic ectasia: Secondary | ICD-10-CM

## 2022-12-04 DIAGNOSIS — E890 Postprocedural hypothyroidism: Secondary | ICD-10-CM

## 2022-12-04 DIAGNOSIS — I10 Essential (primary) hypertension: Secondary | ICD-10-CM

## 2022-12-04 DIAGNOSIS — Z9089 Acquired absence of other organs: Secondary | ICD-10-CM

## 2022-12-04 DIAGNOSIS — E7841 Elevated Lipoprotein(a): Secondary | ICD-10-CM

## 2022-12-04 MED ORDER — LEVOTHYROXINE SODIUM 100 MCG PO TABS: 100.0000 ug | ORAL_TABLET | Freq: Every day | ORAL | 3 refills | Status: AC

## 2022-12-04 NOTE — Progress Notes (Signed)
Cardiology Office Note    Date:  12/06/2022   ID:  Joann Barry 1946/03/11, MRN [id number]  PCP:  Joann Paradise, MD  Cardiologist:  Joann Guadalajara, MD   No chief complaint on file.  7 month F/U evaluation, initially referred through the courtesy of Joann Barry for evaluation of aortic stenosis.  History of Present Illness:  Joann Barry is a 77 y.o. female who I had seen in October 2016 when she was referred by Joann Barry for evaluation of aortic stenosis. I last saw her in February 2024.  She presents for a follow-up evaluation.  Joann Barry has a >  30 year history of hypertension and has been on atenolol 50 mg daily.  She also has a history of osteopenia, anxiety, and GERD.  She had recently seen Joann Barry in because of a systolic cardiac murmur.  She was referred for echo Doppler study which was done on 12/02/2016.  This showed an EF of 55-60%.  She had normal wall motion with grade 1 diastolic dysfunction.  Aortic valve was moderately calcified and functionally bicuspid.  Valve motion was restricted.  She was felt to have moderate aortic stenosis with trivial AR.  Her mean gradient was 28, and peak instantaneous gradient 45 mm.  Her ascending aorta was mildly dilated at 4.5 cm..  There was mild MR, and there was mild left atrial dilatation.  When I initially saw her, she denied any episodes of chest pain or shortness of breath. .  She denied any presyncope or syncope and during periods of anxiety had noticed some mild increased respirations.  When I saw her, since she was asymptomatic.  I recommended close surveillance.  She was hypertensive and had grade 1 diastolic dysfunction and added low-dose losartan 25 mg daily to take with her previously atenolol regimen.  Since her initial evaluation, she has continued to be entirely asymptomatic.  There is only rare shortness of breath when she gets upset.  There is no change in exertional capacity.  She walks.  She denies  palpitations.  She underwent a follow-up echo Doppler study on 05/01/2017.  This had shown some progression of her aortic stenosis such that her mean gradient had increased to 37 mm with a peak instantaneous gradient of 62 mm.  Calculated valve area was 0.8 cm.  There was grade 2 diastolic dysfunction.    When I saw her on May 12, 2017, she  continued to remain asymptomatic.  She specifically denied chest pain, presyncope or syncope, or change in exercise tolerance.  I had recommended that when she had laboratory done with Joann Barry office that they obtain an LP(a) since an increased level has been associated with aortic stenosis.  Her LP(a) level came back elevated at 180.  She has not been on any antilipid therapy.  TC was 182, triglycerides 111, HDL 47, LDL 113.  Her TSH was 0.08 which is low.  She underwent a six-month follow-up echo Doppler study on October 21, 2017.  This essentially was unchanged and showed an EF of 60 to 65% with grade 2 diastolic dysfunction.  Her aortic valve is functionally bicuspid with fusion of the right and left coronary cusps and is calcified.  Her mean gradient was 34 with a peak gradient of 61.  Valve area was 0.73 cm.  Aortic root was mildly dilated with acsending aorta at 44 mm.  PA pressure was 33.    When I  saw  her in September 2019 I reviewed her echo Doppler data.  I recommended initiation of Crestor 20 mg.  Her ECG did not show evidence for LV strain or LVH.  I again had a long discussion regarding aortic stenosis and she remained completely asymptomatic.  I saw her in July 2020 and she continued to be entirely asymptomatic.  She denied chest pain PND orthopnea exertional dyspnea presyncope or syncope.  She notes a rare episode of shortness of breath when she gets very upset.  She denies any exertional dyspnea.  She has continued to be on atenolol 50 mg daily, losartan 25 mg twice a day.  Apparently she has not been taking her rosuvastatin and actually never  got this prescription filled.  She underwent repeat laboratory in September 2019 by Joann Barry.  LDL cholesterol was 113.   A follow-up echo Doppler study on May 27, 2018  showed normal EF of 55 to 60% with only mildly increased wall thickness.  There was suggestion of grade 2 diastolic dysfunction.  Her left atrial size was moderately dilated.  Visually it appeared that her aortic stenosis was in the moderate to moderately severe range.  Mean gradient was 31 with a peak instantaneous gradient of 55.   She was evaluated by Joann Barry on March 22, 2019. She continues to be symptom-free. Her blood pressure was increased but reportedly her blood pressure at home had been normal. A blood pressure log was recommended.  She underwent a follow-up echo Doppler study on August 24, 2019 this showed an EF 55 to 60% with mild LVH and grade 1 diastolic dysfunction. There is mild dilation of her left atrium. There was mild mitral regurgitation. She was now felt to have severe aortic stenosis with a mean gradient of 40. Her peak instantaneous gradient was 66.3. Aortic valve area was 0.6 cm.  I  saw her on October 13, 2019.  At that time she again denied  any chest pain or any episodes of presyncope or syncope.  However, when I interrogated her further, she perhaps does note some slight shortness of breath which she states she gets when she is worrying but denies any definitive exertional symptoms. She had undergone thyroidectomy in January 2021 by Joann Barry.  She had not had recent laboratory and I recommended she undergo a complete set of laboratory including a follow-up lipoprotein a due to its association with aortic stenosis.  LP(a) was significantly increased previously at 243 and she had been started on PCSK9 inhibition with Joann Barry was continuing to take rosuvastatin.  I also referred her to see Joann Barry to allow for discussion concerning possible future TAVR versus open surgery.  She ultimately underwent  definitive cardiac catheterization by me on November 11, 2019 which showed normal coronary arteries.  She had normal to mildly elevated right heart pressures with PA systolic at 38 and mean pressure 23.   She underwent aortic valve replacement by Dr. Evelene Croon on December 06, 2019 and received a 23 mm Edwards pericardial valve and she underwent supra coronary replacement of an ascending aortic aneurysm.  She last saw Dr. Laneta Simmers in follow-up in October and at that time her beta-blocker therapy was further increased due to elevated blood pressure and heart rate.  I last saw her on April 19, 2020.  At that time she felt well and denied any p chest pain or shortness of breath.  She has continued to be on low-dose losartan 25 mg, Toprol tartrate 50 mg twice  a day for blood pressure.  She is on Zoloft for anxiety.  She also has been on rosuvastatin 20 mg and Joann in light of her previously markedly elevated LP(a).   A follow-up echo Doppler study on October 30, 2020 showed an EF of 60 to 65%.  Global longitudinal strain was normal.  She had normal diastolic parameters.  There was mild biatrial enlargement, mild mitral regurgitation, and her bioprosthetic aortic valve was well-seated with a mean gradient of 11 mmHg.  There was mild dilatation of her aortic root at 39 mm.  I saw her on April 25, 2021 at which time she continued to feel well. She states her blood pressure at home typically runs in the 130s to 140 range but at times can be elevated in the 150-160 range.  During that evaluation I recommended she increase losartan from 50 mg to 75 mg and she continues to be on metoprolol tartrate 50 mg twice a day.   She is on rosuvastatin 20 mg on Monday and Friday and is on Joann 140 mg injection every 2 weeks. Lp(a)  was elevated initially at 243.  She has a history of hypothyroidism on levothyroxine 112 mcg.  She continues to be on aspirin.   I last saw her on April 28, 2022. She has continued to be  followed by Joann Barry on a yearly basis.  Is only she is on losartan 75 mg daily and metoprolol tartrate 50 mg twice a day.  She states her blood pressure at home typically runs in the 130s over 70s.  She admits to consistent blood pressure elevation when she comes to the doctor's office.  She continues to be on levothyroxine 112 mcg daily for hypothyroidism.  She is on Joann in addition to rosuvastatin 20 mg for hyperlipidemia and elevated LP(a).  Her  LP(a) was 46 on April 25, 2021.  She denies any chest pain or shortness of breath.  She is not as active as she had in the past.  During that evaluation, her blood pressure was significantly elevated and initially was 191/96 and on recheck by me 178/84.  I recommended further titration of lovastatin to 100 mg daily.  Since I last saw her, she underwent a follow-up echo Doppler study on October 27, 2022.  This showed normal LV function with EF 55 to 60%, mild grade 1 diastolic dysfunction.  Estimated RV pressure was mildly increased at 34.8.  There was mild MR.  Her bioprosthetic aortic valve was without significant abnormality with a mean gradient of 8 mm.  She is status post aortic root/ascending aortic replacement.  There is no major change since her November 2022 echocardiographic evaluation.  Presently, she feels well.Marland Kitchen  Her blood pressure has remained elevated.  Her dose of levothyroxine had been reduced to 100 mcg.  She is no longer taking losartan but was recently started by Joann Barry on olmesartan HCT 40/25 mg daily.  She is on Joann in addition to rosuvastatin for hyperlipidemia and her elevated LP(a).  She presents for reevaluation.    Past Medical History:  Diagnosis Date   Anxiety    Aortic stenosis    documented on cardiac clearance note   Arthritis    Elevated lipoprotein A level 04/19/2019   Heart murmur    Hypercholesterolemia    Hypertension    Personal history of colonic polyps    S/P thyroidectomy 03/30/2019   Thyroid  nodule    Bilateral    Past Surgical History:  Procedure Laterality Date   AORTIC VALVE REPLACEMENT N/A 12/06/2019   Procedure: AORTIC VALVE REPLACEMENT (AVR);  Surgeon: Alleen Borne, MD;  Location[facility] OR;  Service: Open Heart Surgery;  Laterality: N/A;   COLONOSCOPY  01/18/2014   PARTIAL HYSTERECTOMY  1980   POLYPECTOMY     REPLACEMENT ASCENDING AORTA N/A 12/06/2019   Procedure: REPLACEMENT OF ASCENDING AORTIC ANEURYSM;  Surgeon: Alleen Borne, MD;  Location: [facility] OR;  Service: Open Heart Surgery;  Laterality: N/A;  CIRC ARREST   RIGHT HEART CATH AND CORONARY ANGIOGRAPHY N/A 11/11/2019   Procedure: RIGHT HEART CATH AND CORONARY ANGIOGRAPHY;  Surgeon: Lennette Bihari, MD;  Location: [facility] INVASIVE CV LAB;  Service: Cardiovascular;  Laterality: N/A;   TEE WITHOUT CARDIOVERSION N/A 12/06/2019   Procedure: TRANSESOPHAGEAL ECHOCARDIOGRAM (TEE);  Surgeon: Alleen Borne, MD;  Location: [facility] OR;  Service: Open Heart Surgery;  Laterality: N/A;   THYROIDECTOMY  03/30/2019   THYROIDECTOMY N/A 03/30/2019   Procedure: TOTAL THYROIDECTOMY;  Surgeon: Axel Filler, MD;  Location: [facility] OR;  Service: General;  Laterality: N/A;    Current Medications: Outpatient Medications Prior to Visit  Medication Sig Dispense Refill   aspirin EC 81 MG tablet Take 81 mg by mouth daily. Swallow whole.     cholecalciferol (VITAMIN D3) 25 MCG (1000 UNIT) tablet Take 1,000 Units by mouth in the morning and at bedtime.      Evolocumab (Joann SURECLICK) 140 MG/ML SOAJ INJECT 140MG  INTO THE SKIN EVERY 14 DAYS 2 mL 11   fluticasone (FLONASE) 50 MCG/ACT nasal spray Place 2 sprays into both nostrils daily as needed.     metoprolol tartrate (LOPRESSOR) 50 MG tablet Take 1 tablet (50 mg total) by mouth 2 (two) times daily. 60 tablet 1   Multiple Vitamins-Minerals (CENTRUM SILVER 50+WOMEN PO) Take 1 tablet by mouth daily.      olmesartan-hydrochlorothiazide (BENICAR HCT) 40-25 MG tablet Take 1 tablet by mouth daily.      rosuvastatin (CRESTOR) 20 MG tablet TAKE 1 TABLET BY MOUTH IN  THE EVENING 90 tablet 3   sertraline (ZOLOFT) 50 MG tablet Take 50 mg by mouth daily.     levothyroxine (SYNTHROID) 112 MCG tablet Take 1 tablet (112 mcg total) by mouth daily. 30 tablet 11   losartan (COZAAR) 100 MG tablet Take 1 tablet (100 mg total) by mouth daily. 90 tablet 3   No facility-administered medications prior to visit.     Allergies:   Sulfonamide derivatives   Social History   Socioeconomic History   Marital status: Married    Spouse name: Not on file   Number of children: Not on file   Years of education: Not on file   Highest education level: Not on file  Occupational History   Not on file  Tobacco Use   Smoking status: Never   Smokeless tobacco: Never  Vaping Use   Vaping status: Never Used  Substance and Sexual Activity   Alcohol use: No   Drug use: No   Sexual activity: Not on file  Other Topics Concern   Not on file  Social History Narrative   Not on file   Social Determinants of Health   Financial Resource Strain: Not on file  Food Insecurity: Not on file  Transportation Needs: Not on file  Physical Activity: Not on file  Stress: Not on file  Social Connections: Not on file    Additional social history is notable in that she is married for 50 years.  She has 5 children and 16 great and grandchildren.  She works for [employer].  She completed 12th grade of education.  There is no tobacco history or alcohol use.  Family History:  The patient's family history includes Pancreatic cancer in her mother.  Her mother died at 66 from pancreatic cancer.  Her father died at 80.  One brother had neurofibromatosis.  ROS General: Negative; No fevers, chills, or night sweats;  HEENT: Negative; No changes in vision or hearing, sinus congestion, difficulty swallowing Pulmonary: Negative; No cough, wheezing, shortness of breath, hemoptysis Cardiovascular: See HPI GI: Negative; No nausea, vomiting,  diarrhea, or abdominal pain GU: Negative; No dysuria, hematuria, or difficulty voiding Musculoskeletal: Positive for osteopenia Hematologic/Oncology: Negative; no easy bruising, bleeding Endocrine: Negative; no heat/cold intolerance; no diabetes Neuro: Negative; no changes in balance, headaches Skin: Negative; No rashes or skin lesions Psychiatric: Negative; No behavioral problems, depression Sleep: Negative; No snoring, daytime sleepiness, hypersomnolence, bruxism, restless legs, hypnogognic hallucinations, no cataplexy Other comprehensive 14 point system review is negative.   PHYSICAL EXAM:   VS:  BP (!) 154/70   Pulse (!) 52   Ht 5\' 4"  (1.626 m)   Wt 154 lb (69.9 kg)   SpO2 98%   BMI 26.43 kg/m     Repeat blood pressure by me was slightly improved at 178/84.  Wt Readings from Last 3 Encounters:  12/04/22 154 lb (69.9 kg)  04/28/22 163 lb (73.9 kg)  04/25/21 158 lb 9.6 oz (71.9 kg)    General: Alert, oriented, no distress.  Skin: normal turgor, no rashes, warm and dry HEENT: Normocephalic, atraumatic. Pupils equal round and reactive to light; sclera anicteric; extraocular muscles intact;  Nose without nasal septal hypertrophy Mouth/Parynx benign; Mallinpatti scale 2 Neck: No JVD, no carotid bruits; normal carotid upstroke Lungs: clear to ausculatation and percussion; no wheezing or rales Chest wall: without tenderness to palpitation Heart: PMI not displaced, RRR, s1 s2 normal, 2/6 systolic murmur, no diastolic murmur, no rubs, gallops, thrills, or heaves Abdomen: soft, nontender; no hepatosplenomehaly, BS+; abdominal aorta nontender and not dilated by palpation. Back: no CVA tenderness Pulses 2+ Musculoskeletal: full range of motion, normal strength, no joint deformities Extremities: no clubbing cyanosis or edema, Homan's sign negative  Neurologic: grossly nonfocal; Cranial nerves grossly wnl Psychologic: Normal mood and affect     Studies/Labs Reviewed:   EKG  Interpretation Date/Time:  Thursday December 04 2022 08:24:30 EDT Ventricular Rate:  52 PR Interval:  196 QRS Duration:  80 QT Interval:  456 QTC Calculation: 424 R Axis:   8  Text Interpretation: Sinus bradycardia When compared with ECG of 07-Dec-2019 06:33, No significant change was found Confirmed by Joann Barry ([id number]) on 12/04/2022 8:49:24 AM    April 28, 2022 ECG (independently read by me): Normal sinus rhythm at 63 bpm.  No ectopy.  Normal intervals.  February 9. 2023 ECG (independently read by me):  Sius bradycardia. LVH, no ectopy  April 19, 2020 ECG (independently read by me): Normal sinus rhythm at 63 bpm, nonspecific T wave abnormality.  Normal intervals.  No ectopy.  July 2021 ECG (independently read by me): Normal sinus rhythm at 63 bpm.  No ectopy.  No LVH or strain.  July 2020 ECG (independently read by me): Sinus Bradycardia at 58; no ectopy, LVH or strain  September 2019 ECG (independently read by me): Normal sinus rhythm at 70 bpm.  No ectopy. No LVH or strain  February 2019 ECG (independently read by me): Sinus bradycardia 58  bpm.  PR interval 172 ms.  QTc interval 426 ms.  No ectopy.  October 2018 ECG (independently read by me): Normal sinus rhythm at 68 bpm.  Normal intervals.  No ST segment changes.  No ectopy.  Recent Labs:    Latest Ref Rng & Units 04/28/2022    8:45 AM 04/25/2021    9:19 AM 04/24/2020    8:56 AM  BMP  Glucose 70 - 99 mg/dL 536  644  034   BUN 8 - 27 mg/dL 19  23  22    Creatinine 0.57 - 1.00 mg/dL 7.42  5.95  6.38   BUN/Creat Ratio 12 - 28 26  29  28    Sodium 134 - 144 mmol/L 140  141  141   Potassium 3.5 - 5.2 mmol/L 4.4  4.8  4.7   Chloride 96 - 106 mmol/L 103  102  104   CO2 20 - 29 mmol/L 24  25  23    Calcium 8.7 - 10.3 mg/dL 9.4  9.7  9.3         Latest Ref Rng & Units 04/28/2022    8:45 AM 04/25/2021    9:19 AM 11/05/2020    9:34 AM  Hepatic Function  Total Protein 6.0 - 8.5 g/dL 6.6  7.0  6.7   Albumin 3.8 - 4.8 g/dL  4.2  4.4  4.2   AST 0 - 40 IU/L 17  17  17    ALT 0 - 32 IU/L 15  16  15    Alk Phosphatase 44 - 121 IU/L 74  73  72   Total Bilirubin 0.0 - 1.2 mg/dL 0.4  0.4  0.4   Bilirubin, Direct 0.00 - 0.40 mg/dL   7.56        Latest Ref Rng & Units 04/28/2022    8:45 AM 12/10/2019    2:04 AM 12/08/2019    3:36 AM  CBC  WBC 3.4 - 10.8 x10E3/uL 6.9  9.1  8.8   Hemoglobin 11.1 - 15.9 g/dL 43.3  29.5  9.3   Hematocrit 34.0 - 46.6 % 39.6  30.9  28.4   Platelets 150 - 450 x10E3/uL 235  146  93    Lab Results  Component Value Date   MCV 89 04/28/2022   MCV 91.7 12/10/2019   MCV 93.4 12/08/2019   Lab Results  Component Value Date   TSH 0.039 (L) 04/28/2022   Lab Results  Component Value Date   HGBA1C 5.6 12/02/2019     BNP No results found for: "BNP"  ProBNP No results found for: "PROBNP"   Lipid Panel     Component Value Date/Time   CHOL 96 (L) 04/28/2022 0845   TRIG 69 04/28/2022 0845   HDL 57 04/28/2022 0845   CHOLHDL 1.7 04/28/2022 0845   LDLCALC 24 04/28/2022 0845     RADIOLOGY: No results found.   Additional studies/ records that were reviewed today include:  I reviewed the medical records from [facility] medical including the echo Doppler study.  Office visits, ECG, chest x-ray, and laboratory.  Glucose 108, BUN 13, Cr 0.8. We will leave that there letter daddies a creatinine of Barry wanted get this stuff so Barry does not stop right Cholesterol 187, triglycerides 105, HDL 44, LDL 122.  Non-HDL 43. TSH 0.21 Vitamin D 27.6  Laboratory at [facility] on December 14, 2017, LP(a) 180, BUN 16 creatinine 0.8.  Total cholesterol 182, triglycerides 111, HDL 47, LDL 113.  Non-HDL 135.  TSH  0.08.  Vitamin D 25.4.   ASSESSMENT:    1. Primary hypertension   2. S/P aortic valve replacement with bioprosthetic valve   3. Dilated aortic root (HCC): Status post supra coronary replacement of an ascending aortic aneurysm   4. Pure hypercholesterolemia   5. Elevated  lipoprotein(a)   6. S/P thyroidectomy: On levothyroxine      PLAN:  Ms. Joann Barry is a very pleasant 77 year-old female who was initally referred by Joann Barry for evaluation of cardiac murmur.  Her cardiac murmur was concordant with aortic valve stenosis and her initial echocardiographic evaluation in September 2018 demonstrated normal systolic function with grade 1 diastolic dysfunction and moderate aortic stenosis with a mean gradient of 28, peak instantaneous gradient to 45 mm Hg. in addition, there was poststenotic aortic root dilatation at 4.5 cm.  When I initially saw her, she was completely asymptomatic with reference to chest pain, PND, orthopnea, presyncope or syncope and did not have any signs or symptoms of CHF.  She was hypertensive.  With the addition of losartan 25 mg once daily her blood pressure has improved but remained elevated and subsequently was further titrated to 25 mg twice a day losartan was increased to 25 mg twice a day.  Since there is a reported increased incidence of increased LP(a) with aortic stenosis, I obtained an LP(a) evaluation which proved to be significantly elevated at 180. When subsequently seen her LDL cholesterol was 113.  I had recommended initiation of rosuvastatin which she never filled. At a subsequent office visit she agreed to initiate therapy and rosuvastatin was started at 10 mg and titrated up to 20 mg daily. In the future there will be specific therapy potentially aimed at reducing LP(a).  PCSK9 inhibitors have been shown to reduce this level by approximately 26%.  With a subsequent LP(a) level at 243, Joann was initiated.  Her Aortic stenosis significantly progressed to a mean gradient of 40 and peak instantaneous gradient of 66.  She underwent catheterization which revealed normal coronary arteries.  On December 06, 2019 she underwent successful aortic valve replacement utilizing a 23 mm Edwards pericardial valve and supra coronary replacement of  her ascending aortic aneurysm.  Subsequently she has done well and had required some medication titration for blood pressure and heart rate control.  Her echo Doppler study from November 2022 showed excellent LV function with EF at 60 to 65% and normal diastolic parameters.  There is mild dilatation of his aortic root at 39 mm.  The bioprosthetic valve is well-seated with a mean gradient of 11.  There was mild mitral regurgitation.  At subsequent office visits, her blood pressure has been elevated and antihypertensive medical therapy had been titrated to losartan 100 mg in addition to her metoprolol tartrate 50 mg twice a day.  Due to continued ongoing blood pressure at her last evaluation with Joann Barry losartan was discontinued and she was changed to olmesartan HCT 40/25 mg and she has been maintained on metoprolol tartrate 50 mg twice a day.  Blood pressure today continues to be elevated, initially 154/70 and when rechecked by me 166/78.  As result, I have recommended initiation of amlodipine 5 mg to her medical regimen.  We discussed the importance of reducing sodium content in her diet.  She is now on Joann every 2-week injection in addition to rosuvastatin 20 mg for aggressive lipid management.  On June 30, 2022 LDL cholesterol was 32 with total cholesterol 98 HDL 53 and triglycerides 63.  She is now on slightly reduced dose of levothyroxine at 100 mcg.  She will continue current therapy.  I will see her in 6 months for follow-up evaluation or sooner as needed.    Medication Adjustments/Labs and Tests Ordered: Current medicines are reviewed at length with the patient today.  Concerns regarding medicines are outlined above.  Medication changes, Labs and Tests ordered today are listed in the Patient Instructions below. Patient Instructions  Medication Instructions:  TAKE AMLODIPINE 5MG  DAILY. *If you need a refill on your cardiac medications before your next appointment, please call your  pharmacy*   Lab Work: NONE If you have labs (blood work) drawn today and your tests are completely normal, you will receive your results only by: MyChart Message (if you have MyChart) OR A paper copy in the mail If you have any lab test that is abnormal or we need to change your treatment, we will call you to review the results.   Testing/Procedures: NONE   Follow-Up: At [facility], you and your health needs are our priority.  As part of our continuing mission to provide you with exceptional heart care, we have created designated Provider Care Teams.  These Care Teams include your primary Cardiologist (physician) and Advanced Practice Providers (APPs -  Physician Assistants and Nurse Practitioners) who all work together to provide you with the care you need, when you need it.  We recommend signing up for the patient portal called "MyChart".  Sign up information is provided on this After Visit Summary.  MyChart is used to connect with patients for Virtual Visits (Telemedicine).  Patients are able to view lab/test results, encounter notes, upcoming appointments, etc.  Non-urgent messages can be sent to your provider as well.   To learn more about what you can do with MyChart, go to [email or web address].    Your next appointment:   6 month(s)  Provider:   Nicki Guadalajara, MD       Signed, Joann Guadalajara, MD  12/06/2022 3:38 PM    [facility] [address], [address], [place], [place]  [address] Phone: [phone]

## 2022-12-04 NOTE — Patient Instructions (Signed)
Medication Instructions:  TAKE AMLODIPINE 5MG  DAILY. *If you need a refill on your cardiac medications before your next appointment, please call your pharmacy*   Lab Work: NONE If you have labs (blood work) drawn today and your tests are completely normal, you will receive your results only by: MyChart Message (if you have MyChart) OR A paper copy in the mail If you have any lab test that is abnormal or we need to change your treatment, we will call you to review the results.   Testing/Procedures: NONE   Follow-Up: At [facility], you and your health needs are our priority.  As part of our continuing mission to provide you with exceptional heart care, we have created designated Provider Care Teams.  These Care Teams include your primary Cardiologist (physician) and Advanced Practice Providers (APPs -  Physician Assistants and Nurse Practitioners) who all work together to provide you with the care you need, when you need it.  We recommend signing up for the patient portal called "MyChart".  Sign up information is provided on this After Visit Summary.  MyChart is used to connect with patients for Virtual Visits (Telemedicine).  Patients are able to view lab/test results, encounter notes, upcoming appointments, etc.  Non-urgent messages can be sent to your provider as well.   To learn more about what you can do with MyChart, go to [email or web address].    Your next appointment:   6 month(s)  Provider:   Nicki Guadalajara, MD

## 2022-12-05 ENCOUNTER — Encounter: Payer: Self-pay | Admitting: Cardiovascular Disease

## 2022-12-05 MED ORDER — AMLODIPINE BESYLATE 5 MG PO TABS: 5.0000 mg | ORAL_TABLET | Freq: Every day | ORAL | 3 refills | Status: DC

## 2022-12-06 ENCOUNTER — Encounter: Payer: Self-pay | Admitting: Cardiovascular Disease

## 2022-12-10 ENCOUNTER — Other Ambulatory Visit: Payer: Self-pay | Admitting: Cardiovascular Disease

## 2023-04-21 ENCOUNTER — Other Ambulatory Visit (HOSPITAL_COMMUNITY): Payer: Self-pay

## 2023-04-21 ENCOUNTER — Telehealth: Payer: Self-pay

## 2023-04-21 ENCOUNTER — Encounter: Payer: Self-pay | Admitting: Cardiovascular Disease

## 2023-04-21 NOTE — Telephone Encounter (Signed)
 Patient Advocate Encounter   The patient was approved for a [employer] grant that will help cover the cost of REPATHA  Total amount awarded, $2500.  Effective: 04/12/23 - 04/10/24   APW:[id number] ERW:[id number] Hmnle:[id number] PI:[id number]   Pharmacy provided with approval and processing information. Patient informed via MARISA Ileana Lehmann, CPhT  Pharmacy Patient Advocate Specialist  Direct Number: [phone] Fax: [phone]

## 2023-04-21 NOTE — Telephone Encounter (Signed)
Patient re-enrolled in [employer]. [employer] and instructions faxed to pharmacy. Patient informed via Social research officer, government.

## 2023-06-04 ENCOUNTER — Ambulatory Visit: Payer: Medicare Other | Admitting: Cardiovascular Disease

## 2023-06-05 ENCOUNTER — Encounter: Payer: Self-pay | Admitting: Cardiovascular Disease

## 2023-06-05 ENCOUNTER — Ambulatory Visit: Payer: Medicare Other | Attending: Cardiovascular Disease | Admitting: Cardiovascular Disease

## 2023-06-05 DIAGNOSIS — E7800 Pure hypercholesterolemia, unspecified: Secondary | ICD-10-CM | POA: Diagnosis not present

## 2023-06-05 DIAGNOSIS — I350 Nonrheumatic aortic (valve) stenosis: Secondary | ICD-10-CM | POA: Diagnosis not present

## 2023-06-05 DIAGNOSIS — Z953 Presence of xenogenic heart valve: Secondary | ICD-10-CM | POA: Diagnosis not present

## 2023-06-05 DIAGNOSIS — I10 Essential (primary) hypertension: Secondary | ICD-10-CM

## 2023-06-05 DIAGNOSIS — E890 Postprocedural hypothyroidism: Secondary | ICD-10-CM

## 2023-06-05 DIAGNOSIS — E7841 Elevated Lipoprotein(a): Secondary | ICD-10-CM

## 2023-06-05 NOTE — Patient Instructions (Signed)
 Medication Instructions:  No medication changes were made during today's visit.  *If you need a refill on your cardiac medications before your next appointment, please call your pharmacy*   Lab Work: No labs were ordered during today's visit.  If you have labs (blood work) drawn today and your tests are completely normal, you will receive your results only by: MyChart Message (if you have MyChart) OR A paper copy in the mail If you have any lab test that is abnormal or we need to change your treatment, we will call you to review the results.   Testing/Procedures: No procedures were ordered during today's visit.    Follow-Up: At [facility], you and your health needs are our priority.  As part of our continuing mission to provide you with exceptional heart care, we have created designated Provider Care Teams.  These Care Teams include your primary Cardiologist (physician) and Advanced Practice Providers (APPs -  Physician Assistants and Nurse Practitioners) who all work together to provide you with the care you need, when you need it.  We recommend signing up for the patient portal called "MyChart".  Sign up information is provided on this After Visit Summary.  MyChart is used to connect with patients for Virtual Visits (Telemedicine).  Patients are able to view lab/test results, encounter notes, upcoming appointments, etc.  Non-urgent messages can be sent to your provider as well.   To learn more about what you can do with MyChart, go to [email or web address].    Your next appointment:   8 month(s)  Provider:   Dr. Casimiro Needle Copper    Other Instructions [facility]  [address] [place], [place] [address] OPENING APRIL [phone]       1st Floor: - Lobby - Registration  - Pharmacy  - Lab - Cafe   2nd Floor: - PV Lab - Diagnostic Testing (echo, CT, nuclear med)   3rd Floor: - Vacant   4th Floor: - TCTS (cardiothoracic surgery) - AFib  Clinic - Structural Heart Clinic - Vascular Surgery  - Vascular Ultrasound   5th Floor: - HeartCare Cardiology (general and EP) - Clinical Pharmacy for coumadin, hypertension, lipid, weight-loss medications, and med management appointments      Valet parking services will be available as well.     Thank you for choosing [facility]!

## 2023-06-05 NOTE — Progress Notes (Signed)
 Cardiology Office Note    Date:  06/07/2023   ID:  Joann, Barry December 23, 1945, MRN [id number]  PCP:  Joann Paradise, MD  Cardiologist:  Joann Guadalajara, MD   No chief complaint on file.  7 month F/U evaluation, initially referred through the courtesy of Dr. Geoffry Barry for evaluation of aortic stenosis.  History of Present Illness:  Joann Barry is a 78 y.o. female who I had seen in October 2016 when she was referred by Dr. Jacky Barry for evaluation of aortic stenosis. I last saw her in September 2024.  She presents for a follow-up evaluation.  Ms. Joann Barry has a >  30 year history of hypertension and has been on atenolol 50 mg daily.  She also has a history of osteopenia, anxiety, and GERD.  She had recently seen Dr. Jacky Barry in because of a systolic cardiac murmur.  She was referred for echo Doppler study which was done on 12/02/2016.  This showed an EF of 55-60%.  She had normal wall motion with grade 1 diastolic dysfunction.  Aortic valve was moderately calcified and functionally bicuspid.  Valve motion was restricted.  She was felt to have moderate aortic stenosis with trivial AR.  Her mean gradient was 28, and peak instantaneous gradient 45 mm.  Her ascending aorta was mildly dilated at 4.5 cm..  There was mild MR, and there was mild left atrial dilatation.  When I initially saw her, she denied any episodes of chest pain or shortness of breath. .  She denied any presyncope or syncope and during periods of anxiety had noticed some mild increased respirations.  When I saw her, since she was asymptomatic.  I recommended close surveillance.  She was hypertensive and had grade 1 diastolic dysfunction and added low-dose losartan 25 mg daily to take with her previously atenolol regimen.  Since her initial evaluation, she has continued to be entirely asymptomatic.  There is only rare shortness of breath when she gets upset.  There is no change in exertional capacity.  She walks.  She  denies palpitations.  She underwent a follow-up echo Doppler study on 05/01/2017.  This had shown some progression of her aortic stenosis such that her mean gradient had increased to 37 mm with a peak instantaneous gradient of 62 mm.  Calculated valve area was 0.8 cm.  There was grade 2 diastolic dysfunction.    When I saw her on May 12, 2017, she  continued to remain asymptomatic.  She specifically denied chest pain, presyncope or syncope, or change in exercise tolerance.  I had recommended that when she had laboratory done with Dr. Lanell Barry office that they obtain an LP(a) since an increased level has been associated with aortic stenosis.  Her LP(a) level came back elevated at 180.  She has not been on any antilipid therapy.  TC was 182, triglycerides 111, HDL 47, LDL 113.  Her TSH was 0.08 which is low.  She underwent a six-month follow-up echo Doppler study on October 21, 2017.  This essentially was unchanged and showed an EF of 60 to 65% with grade 2 diastolic dysfunction.  Her aortic valve is functionally bicuspid with fusion of the right and left coronary cusps and is calcified.  Her mean gradient was 34 with a peak gradient of 61.  Valve area was 0.73 cm.  Aortic root was mildly dilated with acsending aorta at 44 mm.  PA pressure was 33.    When I  saw  her in September 2019 I reviewed her echo Doppler data.  I recommended initiation of Crestor 20 mg.  Her ECG did not show evidence for LV strain or LVH.  I again had a long discussion regarding aortic stenosis and she remained completely asymptomatic.  I saw her in July 2020 and she continued to be entirely asymptomatic.  She denied chest pain PND orthopnea exertional dyspnea presyncope or syncope.  She notes a rare episode of shortness of breath when she gets very upset.  She denies any exertional dyspnea.  She has continued to be on atenolol 50 mg daily, losartan 25 mg twice a day.  Apparently she has not been taking her rosuvastatin and actually  never got this prescription filled.  She underwent repeat laboratory in September 2019 by Dr. Jacky Barry.  LDL cholesterol was 113.   A follow-up echo Doppler study on May 27, 2018  showed normal EF of 55 to 60% with only mildly increased wall thickness.  There was suggestion of grade 2 diastolic dysfunction.  Her left atrial size was moderately dilated.  Visually it appeared that her aortic stenosis was in the moderate to moderately severe range.  Mean gradient was 31 with a peak instantaneous gradient of 55.   She was evaluated by Joann Barry on March 22, 2019. She continues to be symptom-free. Her blood pressure was increased but reportedly her blood pressure at home had been normal. A blood pressure log was recommended.  She underwent a follow-up echo Doppler study on August 24, 2019 this showed an EF 55 to 60% with mild LVH and grade 1 diastolic dysfunction. There is mild dilation of her left atrium. There was mild mitral regurgitation. She was now felt to have severe aortic stenosis with a mean gradient of 40. Her peak instantaneous gradient was 66.3. Aortic valve area was 0.6 cm.  I saw her on October 13, 2019.  At that time she again denied  any chest pain or any episodes of presyncope or syncope.  However, when I interrogated her further, she perhaps does note some slight shortness of breath which she states she gets when she is worrying but denies any definitive exertional symptoms. She had undergone thyroidectomy in January 2021 by Joann Barry.  She had not had recent laboratory and I recommended she undergo a complete set of laboratory including a follow-up lipoprotein a due to its association with aortic stenosis.  LP(a) was significantly increased previously at 243 and she had been started on PCSK9 inhibition with Repatha he was continuing to take rosuvastatin.  I also referred her to see Dr. Excell Barry to allow for discussion concerning possible future TAVR versus open surgery.  She ultimately underwent  definitive cardiac catheterization by me on November 11, 2019 which showed normal coronary arteries.  She had normal to mildly elevated right heart pressures with PA systolic at 38 and mean pressure 23.   She underwent aortic valve replacement by Dr. Evelene Croon on December 06, 2019 and received a 23 mm Edwards pericardial valve and she underwent supra coronary replacement of an ascending aortic aneurysm.  She last saw Dr. Laneta Simmers in follow-up in October and at that time her beta-blocker therapy was further increased due to elevated blood pressure and heart rate.  I last saw her on April 19, 2020.  At that time she felt well and denied any p chest pain or shortness of breath.  She has continued to be on low-dose losartan 25 mg, Toprol tartrate 50 mg twice a  day for blood pressure.  She is on Zoloft for anxiety.  She also has been on rosuvastatin 20 mg and Repatha in light of her previously markedly elevated LP(a).   A follow-up echo Doppler study on October 30, 2020 showed an EF of 60 to 65%.  Global longitudinal strain was normal.  She had normal diastolic parameters.  There was mild biatrial enlargement, mild mitral regurgitation, and her bioprosthetic aortic valve was well-seated with a mean gradient of 11 mmHg.  There was mild dilatation of her aortic root at 39 mm.  I saw her on April 25, 2021 at which time she continued to feel well. She states her blood pressure at home typically runs in the 130s to 140 range but at times can be elevated in the 150-160 range.  During that evaluation I recommended she increase losartan from 50 mg to 75 mg and she continues to be on metoprolol tartrate 50 mg twice a day.   She is on rosuvastatin 20 mg on Monday and Friday and is on Repatha 140 mg injection every 2 weeks. Lp(a)  was elevated initially at 243.  She has a history of hypothyroidism on levothyroxine 112 mcg.  She continues to be on aspirin.   I saw her on April 28, 2022. She has continued to be  followed by Dr. Jacky Barry on a yearly basis.  Is only she is on losartan 75 mg daily and metoprolol tartrate 50 mg twice a day.  She states her blood pressure at home typically runs in the 130s over 70s.  She admits to consistent blood pressure elevation when she comes to the doctor's office.  She continues to be on levothyroxine 112 mcg daily for hypothyroidism.  She is on Repatha in addition to rosuvastatin 20 mg for hyperlipidemia and elevated LP(a).  Her  LP(a) was 50 on April 25, 2021.  She denies any chest pain or shortness of breath.  She is not as active as she had in the past.  During that evaluation, her blood pressure was significantly elevated and initially was 191/96 and on recheck by me 178/84.  I recommended further titration of lovastatin to 100 mg daily.  I last saw her on December 04, 2022.  She had a follow-up echo Doppler study on October 27, 2022 which showed normal LV function with EF 55 to 60%, mild grade 1 diastolic dysfunction.  Estimated RV pressure was mildly increased at 34.8.  There was mild MR.  Her bioprosthetic aortic valve was without significant abnormality with a mean gradient of 8 mm.  She is status post aortic root/ascending aortic replacement.  There is no major change since her November 2022 echocardiographic evaluation.  During that office visit, she felt well.   Her blood pressure has remained elevated.  Her dose of levothyroxine had been reduced to 100 mcg.  She is no longer taking losartan but was recently started by Dr. Jacky Barry on olmesartan HCT 40/25 mg daily.  She is on Repatha in addition to rosuvastatin for hyperlipidemia and her elevated LP(a).  Recent laboratory from April 2024 showed LDL cholesterol at 32 with total cholesterol 98 HDL 53 and triglycerides 63.  With her blood pressure elevation, I recommended initiation of amlodipine 5 mg to be added to her medical regimen.    Since I last saw her, she has continued to feel well.  She denies chest pain,  shortness of breath, palpitations, presyncope or syncope.  She is followed by Dr. Jacky Barry who checks laboratory.  She continues to be on amlodipine 5 mg, metoprolol tartrate 50 mg twice a day, and olmesartan HCT 40/25 mg for blood pressure control.  She continues to be on Repatha and rosuvastatin 20 mg for hyperlipidemia.  She is on levothyroxine at 100 mcg for hypothyroidism.  She continues to take a baby aspirin.  She presents for evaluation.    Past Medical History:  Diagnosis Date   Anxiety    Aortic stenosis    documented on cardiac clearance note   Arthritis    Elevated lipoprotein A level 04/19/2019   Heart murmur    Hypercholesterolemia    Hypertension    Personal history of colonic polyps    S/P thyroidectomy 03/30/2019   Thyroid nodule    Bilateral    Past Surgical History:  Procedure Laterality Date   AORTIC VALVE REPLACEMENT N/A 12/06/2019   Procedure: AORTIC VALVE REPLACEMENT (AVR);  Surgeon: Alleen Borne, MD;  Location: [facility] OR;  Service: Open Heart Surgery;  Laterality: N/A;   COLONOSCOPY  01/18/2014   PARTIAL HYSTERECTOMY  1980   POLYPECTOMY     REPLACEMENT ASCENDING AORTA N/A 12/06/2019   Procedure: REPLACEMENT OF ASCENDING AORTIC ANEURYSM;  Surgeon: Alleen Borne, MD;  Location: [facility] OR;  Service: Open Heart Surgery;  Laterality: N/A;  CIRC ARREST   RIGHT HEART CATH AND CORONARY ANGIOGRAPHY N/A 11/11/2019   Procedure: RIGHT HEART CATH AND CORONARY ANGIOGRAPHY;  Surgeon: Lennette Bihari, MD;  Location: [facility] INVASIVE CV LAB;  Service: Cardiovascular;  Laterality: N/A;   TEE WITHOUT CARDIOVERSION N/A 12/06/2019   Procedure: TRANSESOPHAGEAL ECHOCARDIOGRAM (TEE);  Surgeon: Alleen Borne, MD;  Location: [facility] OR;  Service: Open Heart Surgery;  Laterality: N/A;   THYROIDECTOMY  03/30/2019   THYROIDECTOMY N/A 03/30/2019   Procedure: TOTAL THYROIDECTOMY;  Surgeon: Axel Filler, MD;  Location: [facility] OR;  Service: General;  Laterality: N/A;    Current Medications: Outpatient  Medications Prior to Visit  Medication Sig Dispense Refill   aspirin EC 81 MG tablet Take 81 mg by mouth daily. Swallow whole.     cholecalciferol (VITAMIN D3) 25 MCG (1000 UNIT) tablet Take 1,000 Units by mouth in the morning and at bedtime.      Evolocumab (REPATHA SURECLICK) 140 MG/ML SOAJ INJECT 140 MG INTO THE SKIN EVERY 14 DAYS 2 mL 11   fluticasone (FLONASE) 50 MCG/ACT nasal spray Place 2 sprays into both nostrils daily as needed.     levothyroxine (SYNTHROID) 100 MCG tablet Take 1 tablet (100 mcg total) by mouth daily before breakfast. 90 tablet 3   metoprolol tartrate (LOPRESSOR) 50 MG tablet Take 1 tablet (50 mg total) by mouth 2 (two) times daily. 60 tablet 1   Multiple Vitamins-Minerals (CENTRUM SILVER 50+WOMEN PO) Take 1 tablet by mouth daily.      olmesartan-hydrochlorothiazide (BENICAR HCT) 40-25 MG tablet Take 1 tablet by mouth daily.     rosuvastatin (CRESTOR) 20 MG tablet TAKE 1 TABLET BY MOUTH IN  THE EVENING 90 tablet 3   amLODipine (NORVASC) 5 MG tablet Take 1 tablet (5 mg total) by mouth daily. 90 tablet 3   No facility-administered medications prior to visit.     Allergies:   Sulfonamide derivatives   Social History   Socioeconomic History   Marital status: Married    Spouse name: Not on file   Number of children: Not on file   Years of education: Not on file   Highest education level: Not on file  Occupational History  Not on file  Tobacco Use   Smoking status: Never   Smokeless tobacco: Never  Vaping Use   Vaping status: Never Used  Substance and Sexual Activity   Alcohol use: No   Drug use: No   Sexual activity: Not on file  Other Topics Concern   Not on file  Social History Narrative   Not on file   Social Drivers of Health   Financial Resource Strain: Not on file  Food Insecurity: Not on file  Transportation Needs: Not on file  Physical Activity: Not on file  Stress: Not on file  Social Connections: Not on file    Additional social  history is notable in that she is married for 50 years.  She has 5 children and 16 great and grandchildren.  She works for [employer].  She completed 12th grade of education.  There is no tobacco history or alcohol use.  Family History:  The patient's family history includes Pancreatic cancer in her mother.  Her mother died at 49 from pancreatic cancer.  Her father died at 30.  One brother had neurofibromatosis.  ROS General: Negative; No fevers, chills, or night sweats;  HEENT: Negative; No changes in vision or hearing, sinus congestion, difficulty swallowing Pulmonary: Negative; No cough, wheezing, shortness of breath, hemoptysis Cardiovascular: See HPI GI: Negative; No nausea, vomiting, diarrhea, or abdominal pain GU: Negative; No dysuria, hematuria, or difficulty voiding Musculoskeletal: Positive for osteopenia Hematologic/Oncology: Negative; no easy bruising, bleeding Endocrine: Negative; no heat/cold intolerance; no diabetes Neuro: Negative; no changes in balance, headaches Skin: Negative; No rashes or skin lesions Psychiatric: Negative; No behavioral problems, depression Sleep: Negative; No snoring, daytime sleepiness, hypersomnolence, bruxism, restless legs, hypnogognic hallucinations, no cataplexy Other comprehensive 14 point system review is negative.   PHYSICAL EXAM:   VS:  BP (!) 163/75   Pulse (!) 53   Ht 5' 4.5" (1.638 m)   Wt 156 lb (70.8 kg)   SpO2 100%   BMI 26.36 kg/m     Repeat blood pressure by me was 134/76  Wt Readings from Last 3 Encounters:  06/05/23 156 lb (70.8 kg)  12/04/22 154 lb (69.9 kg)  04/28/22 163 lb (73.9 kg)    General: Alert, oriented, no distress.  Skin: normal turgor, no rashes, warm and dry HEENT: Normocephalic, atraumatic. Pupils equal round and reactive to light; sclera anicteric; extraocular muscles intact;  Nose without nasal septal hypertrophy Mouth/Parynx benign; Mallinpatti scale 2 Neck: No JVD, no carotid bruits; normal  carotid upstroke Lungs: clear to ausculatation and percussion; no wheezing or rales Chest wall: without tenderness to palpitation Heart: PMI not displaced, RRR, s1 s2 normal, 2/6 systolic murmur, no diastolic murmur, no rubs, gallops, thrills, or heaves Abdomen: soft, nontender; no hepatosplenomehaly, BS+; abdominal aorta nontender and not dilated by palpation. Back: no CVA tenderness Pulses 2+ Musculoskeletal: full range of motion, normal strength, no joint deformities Extremities: no clubbing cyanosis or edema, Homan's sign negative  Neurologic: grossly nonfocal; Cranial nerves grossly wnl Psychologic: Normal mood and affect     Studies/Labs Reviewed:    EKG Interpretation Date/Time:  Friday June 05 2023 09:04:12 EDT Ventricular Rate:  53 PR Interval:  190 QRS Duration:  76 QT Interval:  428 QTC Calculation: 401 R Axis:   27  Text Interpretation: Sinus bradycardia When compared with ECG of 04-Dec-2022 08:24, No significant change was found Confirmed by Joann Barry ([id number]) on 06/07/2023 10:19:22 AM    December 04, 2022 ECG (independently read by me): Sinus bradycardia  at 52 bpm.  April 28, 2022 ECG (independently read by me): Normal sinus rhythm at 63 bpm.  No ectopy.  Normal intervals.  February 9. 2023 ECG (independently read by me):  Sius bradycardia. LVH, no ectopy  April 19, 2020 ECG (independently read by me): Normal sinus rhythm at 63 bpm, nonspecific T wave abnormality.  Normal intervals.  No ectopy.  July 2021 ECG (independently read by me): Normal sinus rhythm at 63 bpm.  No ectopy.  No LVH or strain.  July 2020 ECG (independently read by me): Sinus Bradycardia at 58; no ectopy, LVH or strain  September 2019 ECG (independently read by me): Normal sinus rhythm at 70 bpm.  No ectopy. No LVH or strain  February 2019 ECG (independently read by me): Sinus bradycardia 58 bpm.  PR interval 172 ms.  QTc interval 426 ms.  No ectopy.  October 2018 ECG  (independently read by me): Normal sinus rhythm at 68 bpm.  Normal intervals.  No ST segment changes.  No ectopy.  Recent Labs:    Latest Ref Rng & Units 04/28/2022    8:45 AM 04/25/2021    9:19 AM 04/24/2020    8:56 AM  BMP  Glucose 70 - 99 mg/dL 621  308  657   BUN 8 - 27 mg/dL 19  23  22    Creatinine 0.57 - 1.00 mg/dL 8.46  9.62  9.52   BUN/Creat Ratio 12 - 28 26  29  28    Sodium 134 - 144 mmol/L 140  141  141   Potassium 3.5 - 5.2 mmol/L 4.4  4.8  4.7   Chloride 96 - 106 mmol/L 103  102  104   CO2 20 - 29 mmol/L 24  25  23    Calcium 8.7 - 10.3 mg/dL 9.4  9.7  9.3         Latest Ref Rng & Units 04/28/2022    8:45 AM 04/25/2021    9:19 AM 11/05/2020    9:34 AM  Hepatic Function  Total Protein 6.0 - 8.5 g/dL 6.6  7.0  6.7   Albumin 3.8 - 4.8 g/dL 4.2  4.4  4.2   AST 0 - 40 IU/L 17  17  17    ALT 0 - 32 IU/L 15  16  15    Alk Phosphatase 44 - 121 IU/L 74  73  72   Total Bilirubin 0.0 - 1.2 mg/dL 0.4  0.4  0.4   Bilirubin, Direct 0.00 - 0.40 mg/dL   8.41        Latest Ref Rng & Units 04/28/2022    8:45 AM 12/10/2019    2:04 AM 12/08/2019    3:36 AM  CBC  WBC 3.4 - 10.8 x10E3/uL 6.9  9.1  8.8   Hemoglobin 11.1 - 15.9 g/dL 32.4  40.1  9.3   Hematocrit 34.0 - 46.6 % 39.6  30.9  28.4   Platelets 150 - 450 x10E3/uL 235  146  93    Lab Results  Component Value Date   MCV 89 04/28/2022   MCV 91.7 12/10/2019   MCV 93.4 12/08/2019   Lab Results  Component Value Date   TSH 0.039 (L) 04/28/2022   Lab Results  Component Value Date   HGBA1C 5.6 12/02/2019     BNP No results found for: "BNP"  ProBNP No results found for: "PROBNP"   Lipid Panel     Component Value Date/Time   CHOL 96 (L) 04/28/2022 0845   TRIG  69 04/28/2022 0845   HDL 57 04/28/2022 0845   CHOLHDL 1.7 04/28/2022 0845   LDLCALC 24 04/28/2022 0845     RADIOLOGY: No results found.   Additional studies/ records that were reviewed today include:  I reviewed the medical records from [facility] medical  including the echo Doppler study.  Office visits, ECG, chest x-ray, and laboratory.  Glucose 108, BUN 13, Cr 0.8. We will leave that there letter daddies a creatinine of he wanted get this stuff so he does not stop right Cholesterol 187, triglycerides 105, HDL 44, LDL 122.  Non-HDL 43. TSH 0.21 Vitamin D 27.6  Laboratory at [facility] on December 14, 2017, LP(a) 180, BUN 16 creatinine 0.8.  Total cholesterol 182, triglycerides 111, HDL 47, LDL 113.  Non-HDL 135.  TSH 0.08.  Vitamin D 25.4.   ASSESSMENT:    1. Primary hypertension   2. Severe aortic stenosis   3. S/P aortic valve replacement with bioprosthetic valve: Dr. Laneta Simmers - December 06, 2019   4. Pure hypercholesterolemia   5. Elevated lipoprotein(a)   6. S/P thyroidectomy: On levothyroxine    PLAN:  Ms. Jossie Smoot is a very pleasant 78 year-old female who was initally referred by Dr. Jacky Barry for evaluation of cardiac murmur.  Her cardiac murmur was concordant with aortic valve stenosis and her initial echocardiographic evaluation in September 2018 demonstrated normal systolic function with grade 1 diastolic dysfunction and moderate aortic stenosis with a mean gradient of 28, peak instantaneous gradient to 45 mm Hg. in addition, there was poststenotic aortic root dilatation at 4.5 cm.  When I initially saw her, she was completely asymptomatic with reference to chest pain, PND, orthopnea, presyncope or syncope and did not have any signs or symptoms of CHF.  She was hypertensive.  With the addition of losartan 25 mg once daily her blood pressure has improved but remained elevated and subsequently was further titrated to 25 mg twice a day losartan was increased to 25 mg twice a day.  Since there is a reported increased incidence of increased LP(a) with aortic stenosis, I obtained an LP(a) evaluation which proved to be significantly elevated at 180. When subsequently seen her LDL cholesterol was 113.  I had recommended initiation of  rosuvastatin which she never filled. At a subsequent office visit she agreed to initiate therapy and rosuvastatin was started at 10 mg and titrated up to 20 mg daily. In the future there will be specific therapy potentially aimed at reducing LP(a).  PCSK9 inhibitors have been shown to reduce this level by approximately 26%.  With a subsequent LP(a) level at 243, Repatha was initiated.  Her Aortic stenosis significantly progressed to a mean gradient of 40 and peak instantaneous gradient of 66.  She underwent catheterization which revealed normal coronary arteries.  On December 06, 2019 she underwent successful aortic valve replacement utilizing a 23 mm Edwards pericardial valve and supra coronary replacement of her ascending aortic aneurysm.  Subsequently she has done well and had required some medication titration for blood pressure and heart rate control.  Her echo Doppler study from November 2022 showed excellent LV function with EF at 60 to 65% and normal diastolic parameters.  There is mild dilatation of his aortic root at 39 mm.  The bioprosthetic valve is well-seated with a mean gradient of 11.  There was mild mitral regurgitation.  At subsequent office visits, her blood pressure has been elevated and antihypertensive medical therapy had been titrated to losartan 100 mg in addition  to her metoprolol tartrate 50 mg twice a day.  Due to continued ongoing blood pressure at her last evaluation with Dr. Jacky Barry losartan was discontinued and she was changed to olmesartan HCT 40/25 mg and she has been maintained on metoprolol tartrate 50 mg twice a day.  At my last evaluation blood pressure continued to be elevated and amlodipine was added to her medical regimen.  Her blood pressure today is stable and on repeat by me was 134/76 although upon arrival when taken by the nurse was elevated.  She continues to be on amlodipine 5 mg, metoprolol tartrate 50 mg twice a day, and olmesartan HCT 40/25 mg daily.  Lipids are  excellent on rosuvastatin 20 mg and Repatha with laboratory when last checked by her primary physician with total cholesterol 98 and LDL at 32.  She will be seeing Dr. Jacky Barry over the next several weeks and follow-up laboratory will be obtained.  She continues to be on levothyroxine for hypothyroidism.  Clinically she is asymptomatic and doing well.  I discussed with him my plans for retirement this year.  I will transition her to the care of Dr. Tonny Bollman for follow-up Cardiologic evaluation.   Medication Adjustments/Labs and Tests Ordered: Current medicines are reviewed at length with the patient today.  Concerns regarding medicines are outlined above.  Medication changes, Labs and Tests ordered today are listed in the Patient Instructions below. Patient Instructions  Medication Instructions:  No medication changes were made during today's visit.  *If you need a refill on your cardiac medications before your next appointment, please call your pharmacy*   Lab Work: No labs were ordered during today's visit.  If you have labs (blood work) drawn today and your tests are completely normal, you will receive your results only by: MyChart Message (if you have MyChart) OR A paper copy in the mail If you have any lab test that is abnormal or we need to change your treatment, we will call you to review the results.   Testing/Procedures: No procedures were ordered during today's visit.    Follow-Up: At [facility], you and your health needs are our priority.  As part of our continuing mission to provide you with exceptional heart care, we have created designated Provider Care Teams.  These Care Teams include your primary Cardiologist (physician) and Advanced Practice Providers (APPs -  Physician Assistants and Nurse Practitioners) who all work together to provide you with the care you need, when you need it.  We recommend signing up for the patient portal called "MyChart".  Sign up  information is provided on this After Visit Summary.  MyChart is used to connect with patients for Virtual Visits (Telemedicine).  Patients are able to view lab/test results, encounter notes, upcoming appointments, etc.  Non-urgent messages can be sent to your provider as well.   To learn more about what you can do with MyChart, go to [email or web address].    Your next appointment:   8 month(s)  Provider:   Dr. Casimiro Needle Copper    Other Instructions [facility]  [address] [place], [place] [address] [address] [place] [phone]       1st Floor: - Lobby - Registration  - Pharmacy  - Lab - Cafe   2nd Floor: - PV Lab - Diagnostic Testing (echo, CT, nuclear med)   3rd Floor: - Vacant   4th Floor: - TCTS (cardiothoracic surgery) - AFib Clinic - Structural Heart Clinic - Vascular Surgery  - Vascular  Ultrasound   5th Floor: - [facility] Cardiology (general and EP) - Clinical Pharmacy for coumadin, hypertension, lipid, weight-loss medications, and med management appointments      Valet parking services will be available as well.     Thank you for choosing [facility]!       Signed, Joann Guadalajara, MD  06/07/2023 10:25 AM    [facility] [address], [place], [place]  [address] Phone: [phone]

## 2023-06-07 ENCOUNTER — Encounter: Payer: Self-pay | Admitting: Cardiovascular Disease

## 2023-07-31 ENCOUNTER — Encounter: Payer: Self-pay | Admitting: Cardiovascular Disease

## 2023-11-23 ENCOUNTER — Other Ambulatory Visit: Payer: Self-pay

## 2023-11-23 ENCOUNTER — Telehealth: Payer: Self-pay | Admitting: Cardiovascular Disease

## 2023-11-23 DIAGNOSIS — E7800 Pure hypercholesterolemia, unspecified: Secondary | ICD-10-CM

## 2023-11-23 DIAGNOSIS — E7841 Elevated Lipoprotein(a): Secondary | ICD-10-CM

## 2023-11-23 DIAGNOSIS — E785 Hyperlipidemia, unspecified: Secondary | ICD-10-CM

## 2023-11-23 MED ORDER — REPATHA SURECLICK 140 MG/ML ~~LOC~~ SOAJ
140.0000 mg | SUBCUTANEOUS | 1 refills | Status: AC
Start: 2023-11-23 — End: ?

## 2023-11-23 NOTE — Telephone Encounter (Signed)
Sent to pharm d.

## 2023-11-23 NOTE — Telephone Encounter (Signed)
*  STAT* If patient is at the pharmacy, call can be transferred to refill team.   1. Which medications need to be refilled? (please list name of each medication and dose if known)   Evolocumab (REPATHA SURECLICK) 140 MG/ML SOAJ    2. Which pharmacy/location (including street and city if local pharmacy) is medication to be sent to? [id number] - [place], [place] - [address]    3. Do they need a 30 day or 90 day supply? 90 day

## 2024-02-01 ENCOUNTER — Ambulatory Visit: Attending: Cardiovascular Disease | Admitting: Cardiovascular Disease

## 2024-02-01 ENCOUNTER — Encounter: Payer: Self-pay | Admitting: Cardiovascular Disease

## 2024-02-01 VITALS — BP 140/86 | HR 65 | Ht 64.5 in | Wt 157.2 lb

## 2024-02-01 DIAGNOSIS — E782 Mixed hyperlipidemia: Secondary | ICD-10-CM | POA: Diagnosis not present

## 2024-02-01 DIAGNOSIS — I10 Essential (primary) hypertension: Secondary | ICD-10-CM

## 2024-02-01 DIAGNOSIS — E7841 Elevated Lipoprotein(a): Secondary | ICD-10-CM

## 2024-02-01 DIAGNOSIS — Z953 Presence of xenogenic heart valve: Secondary | ICD-10-CM | POA: Diagnosis not present

## 2024-02-01 NOTE — Patient Instructions (Signed)
 Medication Instructions:  Your physician recommends that you continue on your current medications as directed. Please refer to the Current Medication list given to you today.  *If you need a refill on your cardiac medications before your next appointment, please call your pharmacy*  Follow-Up: At [facility], you and your health needs are our priority.  As part of our continuing mission to provide you with exceptional heart care, our providers are all part of one team.  This team includes your primary Cardiologist (physician) and Advanced Practice Providers or APPs (Physician Assistants and Nurse Practitioners) who all work together to provide you with the care you need, when you need it.  Your next appointment:   1 year  Provider:  Dr. Wonda

## 2024-02-01 NOTE — Progress Notes (Signed)
 Cardiology Office Note:    Date:  02/01/2024   ID:  Joann Barry, DOB 1946-01-04, MRN [id number]  PCP:  Shepard Ade, MD   [facility] Providers Cardiologist:  Debby Sor, MD (Inactive) Structural Heart:  Ozell Fell, MD    Referring MD: Shepard Ade, MD   Chief Complaint  Patient presents with   Hypertension    History of Present Illness:    Joann Barry is a 78 y.o. female with a hx of hypertension and aortic valve disease, presenting for follow-up evaluation.  She has been followed by Dr. Sor and I will be assuming her care in his retirement.  The patient underwent bioprosthetic aortic valve replacement for treatment of severe aortic stenosis in 2021.  She was treated with a 23 mm Edwards pericardial valve and supra coronary replacement of the ascending aorta for treatment of aneurysm.  She has required an increase in her antihypertensive therapies for uncontrolled hypertension over recent office visits.  She was found to have normal coronary arteries for preop evaluation in 2021.  Her most recent echocardiogram October 27, 2022 showed normal LVEF 55 to 60%, mild RV dysfunction, mild MR, and normal function of the bioprosthetic aortic valve with a mean gradient of 8 mmHg.  The patient is here alone today. She reports that she has been doing well. She states that her BP usually runs lower than today's reading. Amlodipine  was previously discontinued because of low BP. 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 She isn't engaged in regular exercise. Today, she denies symptoms of palpitations, chest pain, shortness of breath, orthopnea, PND, lower extremity edema, dizziness, or syncope.    Current Medications: Current Meds  Medication Sig   aspirin  EC 81 MG tablet Take 81 mg by mouth daily. Swallow whole.   cholecalciferol (VITAMIN D3) 25 MCG (1000 UNIT) tablet Take  1,000 Units by mouth in the morning and at bedtime.    Evolocumab  (REPATHA  SURECLICK) 140 MG/ML SOAJ Inject 140 mg into the skin every 14 (fourteen) days.   fluticasone  (FLONASE ) 50 MCG/ACT nasal spray Place 2 sprays into both nostrils daily as needed.   levothyroxine  (SYNTHROID ) 100 MCG tablet Take 1 tablet (100 mcg total) by mouth daily before breakfast.   metoprolol  tartrate (LOPRESSOR ) 50 MG tablet Take 1 tablet (50 mg total) by mouth 2 (two) times daily.   Multiple Vitamins-Minerals (CENTRUM SILVER 50+WOMEN PO) Take 1 tablet by mouth daily.    olmesartan-hydrochlorothiazide (BENICAR HCT) 40-25 MG tablet Take 1 tablet by mouth daily.   rosuvastatin  (CRESTOR ) 20 MG tablet TAKE 1 TABLET BY MOUTH IN  THE EVENING     Allergies:   Sulfonamide derivatives   ROS:   Please see the history of present illness.    All other systems reviewed and are negative.  EKGs/Labs/Other Studies Reviewed:    The following studies were reviewed today: Cardiac Studies & Procedures   ______________________________________________________________________________________________ CARDIAC CATHETERIZATION  CARDIAC CATHETERIZATION 11/11/2019  Conclusion Normal to mildly elevated right heart pressures with PA systolic pressure at 38 and mean pressure at 23.  Normal coronary arteries.  Previous echo Doppler determination of severe aortic stenosis with a peak gradient of 66, mean gradient of 40, DI of 0.16 and AVA of 0.5 cm.  RECOMMENDATION: Patient will return to the structural heart team for further evaluation of TAVR candidacy.   To further evaluate the size of her ascending aorta, she  will be scheduled for a gated cardiac CTA as well as a CTA of the chest, abdomen, and pelvis.  Findings Coronary Findings Diagnostic  Dominance: Right  Ramus Intermedius Vessel is small.  Intervention  No interventions have been documented.     ECHOCARDIOGRAM  ECHOCARDIOGRAM COMPLETE  10/27/2022  Narrative ECHOCARDIOGRAM REPORT    Patient Name:   Joann Barry Date of Exam: 10/27/2022 Medical Rec #:  [id number]      Height:       64.5 in Accession #:    [id number]     Weight:       163.0 lb Date of Birth:  [birth year]      BSA:          1.803 m Patient Age:    77 years       BP:           191/96 mmHg Patient Gender: F              HR:           54 bpm. Exam Location:  [address]  Procedure: 2D Echo, Cardiac Doppler and Color Doppler  Indications:    Aortic valve disorder [I35.9 (ICD-10-CM)]  History:        Patient has prior history of Echocardiogram examinations, most recent 01/15/2021. Aortic Valve Disease; Risk Factors:Hypertension, Dyslipidemia and Non-Smoker.  Sonographer:    Alan Greenhouse RDMS, RVT, RDCS Referring Phys: [id number] THOMAS A KELLY  IMPRESSIONS   1. Left ventricular ejection fraction, by estimation, is 55 to 60%. The left ventricle has normal function. The left ventricle has no regional wall motion abnormalities. Left ventricular diastolic parameters are consistent with Grade I diastolic dysfunction (impaired relaxation). 2. Right ventricular systolic function is mildly reduced. The right ventricular size is normal. There is normal pulmonary artery systolic pressure. The estimated right ventricular systolic pressure is 34.8 mmHg. 3. The mitral valve is normal in structure. Mild mitral valve regurgitation. No evidence of mitral stenosis. 4. Bioprosthetic aortic valve. Mean gradient 8 mmHg (not elevated), no significant regurgitation. 5. Aortic root/ascending aorta has been repaired/replaced. 6. The inferior vena cava is normal in size with greater than 50% respiratory variability, suggesting right atrial pressure of 3 mmHg.  Comparison(s): Echocardiogram done 01/15/21 showed an EF of 60-65% with an AV Mean Grad of 11 mmHg.  FINDINGS Left Ventricle: Left ventricular ejection fraction, by estimation, is 55 to 60%. The left ventricle has normal  function. The left ventricle has no regional wall motion abnormalities. The left ventricular internal cavity size was normal in size. There is no left ventricular hypertrophy. Left ventricular diastolic parameters are consistent with Grade I diastolic dysfunction (impaired relaxation).  Right Ventricle: The right ventricular size is normal. No increase in right ventricular wall thickness. Right ventricular systolic function is mildly reduced. There is normal pulmonary artery systolic pressure. The tricuspid regurgitant velocity is 2.82 m/s, and with an assumed right atrial pressure of 3 mmHg, the estimated right ventricular systolic pressure is 34.8 mmHg.  Left Atrium: Left atrial size was normal in size.  Right Atrium: Right atrial size was normal in size.  Pericardium: There is no evidence of pericardial effusion.  Mitral Valve: The mitral valve is normal in structure. There is mild calcification of the mitral valve leaflet(s). Mild mitral annular calcification. Mild mitral valve regurgitation. No evidence of mitral valve stenosis.  Tricuspid Valve: The tricuspid valve is normal in structure. Tricuspid valve regurgitation is trivial.  Aortic Valve: Bioprosthetic aortic valve. Mean  gradient 8 mmHg (not elevated), no significant regurgitation. The aortic valve has been repaired/replaced. Aortic valve regurgitation is not visualized. Aortic valve mean gradient measures 8.0 mmHg. Aortic valve peak gradient measures 15.1 mmHg. There is a 23 mm Edwards Inspiris resilia valve present in the aortic position.  Pulmonic Valve: The pulmonic valve was normal in structure. Pulmonic valve regurgitation is not visualized.  Aorta: The aortic root/ascending aorta has been repaired/replaced.  Venous: The inferior vena cava is normal in size with greater than 50% respiratory variability, suggesting right atrial pressure of 3 mmHg.  IAS/Shunts: No atrial level shunt detected by color flow Doppler.   LEFT  VENTRICLE PLAX 2D LVIDd:         4.80 cm     Diastology LVIDs:         3.10 cm     LV e' medial:    9.57 cm/s LV PW:         0.70 cm     LV E/e' medial:  10.3 LV IVS:        0.90 cm     LV e' lateral:   12.00 cm/s LV E/e' lateral: 8.2  LV Volumes (MOD) LV vol d, MOD A2C: 54.7 ml LV vol d, MOD A4C: 53.1 ml LV vol s, MOD A2C: 16.2 ml LV vol s, MOD A4C: 18.9 ml LV SV MOD A2C:     38.5 ml LV SV MOD A4C:     53.1 ml LV SV MOD BP:      35.6 ml  RIGHT VENTRICLE RV S prime:     11.00 cm/s TAPSE (M-mode): 2.0 cm  LEFT ATRIUM             Index        RIGHT ATRIUM           Index LA diam:        3.20 cm 1.77 cm/m   RA Area:     16.30 cm LA Vol (A2C):   53.4 ml 29.62 ml/m  RA Volume:   40.40 ml  22.41 ml/m LA Vol (A4C):   38.1 ml 21.13 ml/m LA Biplane Vol: 45.2 ml 25.07 ml/m AORTIC VALVE AV Vmax:           194.00 cm/s AV Vmean:          125.500 cm/s AV VTI:            0.460 m AV Peak Grad:      15.1 mmHg AV Mean Grad:      8.0 mmHg LVOT Vmax:         155.00 cm/s LVOT Vmean:        102.000 cm/s LVOT VTI:          0.362 m LVOT/AV VTI ratio: 0.79  AORTA Ao Root diam: 3.10 cm  MITRAL VALVE                    TRICUSPID VALVE MV Area (PHT): 3.89 cm         TR Peak grad:   31.8 mmHg MV Decel Time: 195 msec         TR Vmax:        282.00 cm/s MR Peak grad:      156.5 mmHg MR Mean grad:      91.0 mmHg    SHUNTS MR Vmax:           625.50 cm/s  Systemic VTI: 0.36 m MR Vmean:  453.0 cm/s MR Vena Contracta: 0.20 cm MR PISA:           0.25 cm MR PISA Eff ROA:   1 mm MR PISA Radius:    0.20 cm MV E velocity: 98.50 cm/s MV A velocity: 98.50 cm/s MV E/A ratio:  1.00  Dalton McleanMD Electronically signed by Ezra Kanner Signature Date/Time: 10/27/2022/8:29:38 AM    Final   TEE  ECHO INTRAOPERATIVE TEE 12/06/2019  Narrative *INTRAOPERATIVE TRANSESOPHAGEAL REPORT *    Patient Name:   Joann Barry Date of Exam: 12/06/2019 Medical Rec #:  [id number]       Height:       64.5 in Accession #:    [id number]     Weight:       157.0 lb Date of Birth:  07-26-1945      BSA:          1.77 m Patient Age:    74 years       BP:           175/55 mmHg Patient Gender: F              HR:           69 bpm. Exam Location:  Anesthesiology  Transesophogeal exam was perform intraoperatively during surgical procedure. Patient was closely monitored under general anesthesia during the entirety of examination.  Indications:     Aortic Stenosis Sonographer:     Augustin Seals RDCS (AE) Performing Phys: [id number] DORISE POUR BARTLE Diagnosing Phys: Cordella Stoltzfus  Complications: No known complications during this procedure. POST-OP IMPRESSIONS - Left Ventricle: The left ventricle is unchanged from pre-bypass. - Aorta: A graft was placed in the ascending aorta for repair.Hemashield Platinum 30 in proper position. - Left Atrial Appendage: The left atrial appendage appears unchanged from pre-bypass. - Aortic Valve: No stenosis present. A bovine bioprosthetic valve was placed, leaflets are freely mobile Manufactured by; Inspiris Size; 23mm. There is no regurgitation. The gradient recorded across the prosthetic valve is within the expected range. - Mitral Valve: The mitral valve appears unchanged from pre-bypass. - Tricuspid Valve: The tricuspid valve appears unchanged from pre-bypass. - Interatrial Septum: The interatrial septum appears unchanged from pre-bypass. - Pericardium: The pericardium appears unchanged from pre-bypass. S/P AVR with size 23 Inspiris bioprosthetic. Valve seated appropriately without rock or paravalvular leak. Max/mean PG 11/90mmHg down from 63/6mmHg pre-operative with estimated AVA 0.67cm2. S/P size 30 Hemashield Platinum graft to ascending aorta in appropriate position with max diameter measured 3.22cm (down from 4.5cm pre-operative).  PRE-OP FINDINGS Left Ventricle: The left ventricle has normal systolic function, with an ejection  fraction of 55-60%. The cavity size was mildly dilated. There is mildly increased left ventricular wall thickness. No evidence of left ventricular regional wall motion abnormalities.  Right Ventricle: The right ventricle has normal systolic function. The cavity was normal. There is no increase in right ventricular wall thickness. Right ventricular systolic pressure is normal.  Left Atrium: Left atrial size was dilated. The left atrial appendage is well visualized and there is no evidence of thrombus present. Left atrial appendage velocity is normal at greater than 40 cm/s.  Right Atrium: Right atrial size was normal in size.  Interatrial Septum: No atrial level shunt detected by color flow Doppler.  Pericardium: There is no evidence of pericardial effusion.  Mitral Valve: The mitral valve is normal in structure. Mild thickening of the mitral valve leaflet. Mild calcification of the mitral valve leaflet. Mitral valve regurgitation is mild  by color flow Doppler. The MR jet is centrally-directed. There is no evidence of mitral valve vegetation. Pulmonary venous flow is normal. There is No evidence of mitral stenosis.  Tricuspid Valve: The tricuspid valve was normal in structure. Tricuspid valve regurgitation is trivial by color flow Doppler. There is no evidence of tricuspid valve vegetation.  Aortic Valve: The aortic valve is bicuspid There is severe thickening of the aortic valve and There is severe calcifcation of the aortic valve Aortic valve regurgitation is mild to moderate by color flow Doppler. Trace diastolic reversal in descending thoracic aorta pre CPB. The jet is posteriorly-directed. There is severe stenosis of the aortic valve, with a calculated valve area of 0.67 cm. There is no evidence of a vegetation on the aortic valve.  Pulmonic Valve: The pulmonic valve was normal in structure No evidence of pumonic stenosis. Pulmonic valve regurgitation is trivial by color flow  Doppler.   Aorta: The ascending aorta is dilated with max diameterer is normal in size and structure. Pulmonary Artery: The pulmonary artery is of normal size.  Venous: The inferior vena cava is normal in size with greater than 50% respiratory variability, suggesting right atrial pressure of 3 mmHg.  +--------------+--------++ LEFT VENTRICLE         +----------------+---------++ +--------------+--------++ Diastology                PLAX 2D                +----------------+---------++ +--------------+--------++ LV e' lateral:  5.45 cm/s LVIDd:        3.54 cm  +----------------+---------++ +--------------+--------++ LV E/e' lateral:12.2      LVIDs:        2.44 cm  +----------------+---------++ +--------------+--------++ LV e' medial:   5.56 cm/s LV PW:        1.20 cm  +----------------+---------++ +--------------+--------++ LV E/e' medial: 12.0      LV IVS:       1.40 cm  +----------------+---------++ +--------------+--------++ LVOT diam:    2.10 cm  +--------------+--------++ LV SV:        31 ml    +--------------+--------++ LV SV Index:  17.22    +--------------+--------++ LVOT Area:    3.46 cm +--------------+--------++                        +--------------+--------++  +------------------+------------++ AORTIC VALVE                   +------------------+------------++ AV Area (Vmax):   0.91 cm     +------------------+------------++ AV Area (Vmean):  1.01 cm     +------------------+------------++ AV Area (VTI):    1.12 cm     +------------------+------------++ AV Vmax:          280.50 cm/s  +------------------+------------++ AV Vmean:         190.500 cm/s +------------------+------------++ AV VTI:           0.745 m      +------------------+------------++ AV Peak Grad:     31.5 mmHg    +------------------+------------++ AV Mean Grad:     21.5 mmHg     +------------------+------------++ LVOT Vmax:        73.30 cm/s   +------------------+------------++ LVOT Vmean:       55.350 cm/s  +------------------+------------++ LVOT VTI:         0.242 m      +------------------+------------++ LVOT/AV VTI ratio:0.32         +------------------+------------++ AR PHT:  338 msec     +------------------+------------++  +--------------+-------++ AORTA                 +--------------+-------++ Ao Sinus diam:3.30 cm +--------------+-------++ Ao STJ diam:  3.0 cm  +--------------+-------++ Ao Asc diam:  4.50 cm +--------------+-------++ Ao Desc diam: 2.40 cm +--------------+-------++  +--------------+----------++ MITRAL VALVE             +--------------+-------+ +--------------+----------++ SHUNTS                MV Area (PHT):3.21 cm   +--------------+-------+ +--------------+----------++ Systemic VTI: 0.24 m  MV Peak grad: 1.8 mmHg   +--------------+-------+ +--------------+----------++ Systemic Diam:2.10 cm MV Mean grad: 1.0 mmHg   +--------------+-------+ +--------------+----------++ MV Vmax:      0.68 m/s   +--------------+----------++ MV Vmean:     35.0 cm/s  +--------------+----------++ MV VTI:       0.23 m     +--------------+----------++ MV PHT:       68.44 msec +--------------+----------++ MV Decel Time:236 msec   +--------------+----------++ +--------------+----------++ MV E velocity:66.60 cm/s +--------------+----------++ MV A velocity:39.40 cm/s +--------------+----------++ MV E/A ratio: 1.69       +--------------+----------++  +-------------------+----------+ PULMONARY VEINS               +-------------------+----------+ Diastolic Velocity:48.30 cm/s +-------------------+----------+ S/D Velocity:      0.90       +-------------------+----------+ Systolic Velocity: 41.40  cm/s +-------------------+----------+   Cordella Fix Electronically signed by Cordella Fix Signature Date/Time: 12/06/2019/1:38:35 PM    Final    CT SCANS  CT CORONARY MORPH W/CTA COR W/SCORE 11/15/2019  Addendum 11/15/2019  5:00 PM ADDENDUM REPORT: 11/15/2019 16:52  ADDENDUM: Extracardiac findings were described separately under dictation for contemporaneously obtained CTA chest, abdomen and pelvis dated 11/15/2019. Please see that report for full description of relevant extracardiac findings.   Electronically Signed By: Toribio Aye M.D. On: 11/15/2019 16:52  Narrative CLINICAL DATA:  Aortic Stenosis  EXAM: Cardiac TAVR CT  TECHNIQUE: The patient was scanned on a Siemens Force 192 slice scanner. A 120 kV retrospective scan was triggered in the ascending thoracic aorta at 140 HU's. Gantry rotation speed was 250 msecs and collimation was .6 mm. No beta blockade or nitro were given. The 3D data set was reconstructed in 5% intervals of the R-R cycle. Systolic and diastolic phases were analyzed on a dedicated work station using MPR, MIP and VRT modes. The patient received 80 cc of contrast.  FINDINGS: Aortic Valve: Functionally bicuspid with fused right and left cusps calcified with restricted leaflet motion AV calcium  score of 2741 suggesting severe AS  Aorta: Moderately dilated aortic root with normal arch vessels and mild calcific atherosclerosis  Sino-tubular Junction: 31 mm  Ascending Thoracic Aorta: 43 mm  Aortic Arch: 34 mm  Descending Thoracic Aorta: 26 mm  Sinus of Valsalva Measurements:  Commissural: 29.6 mm  Long Axis: 38.7 mm  Coronary Artery Height above Annulus:  Left Main: 14.65 mm above annulus  Right Coronary: 11.61 mm above annulus  Virtual Basal Annulus Measurements:  Maximum / Minimum Diameter: 27.8 mm x 21.5 mm  Perimeter: 80.4 mm  Area: 500.58 mm 2  Coronary Arteries: Sufficient height above annulus for  deployment  Optimum Fluoroscopic Angle for Delivery: LAO 8 Caudal 5 degrees  IMPRESSION: 1. Bicuspid AV with calcium  score of 2741 suggesting severe AS  2.  Annular area of 500 mm2 suitable for a 26 mm Sapien 3 valve  3.  Moderately dilated aortic root 4.3 cm  4.  Optimal angiographic angle for deployment LAO 8 Caudal 5 degrees  5.  Coronary arteries sufficient height above annulus for deployment  Joann Barry  Electronically Signed: By: Joann Barry M.D. On: 11/15/2019 12:00     ______________________________________________________________________________________________      EKG:        Recent Labs: No results found for requested labs within last 365 days.  Recent Lipid Panel    Component Value Date/Time   CHOL 96 (L) 04/28/2022 0845   TRIG 69 04/28/2022 0845   HDL 57 04/28/2022 0845   CHOLHDL 1.7 04/28/2022 0845   LDLCALC 24 04/28/2022 0845            Physical Exam:    VS:  BP (!) 140/86 (BP Location: Left Arm, Patient Position: Sitting, Cuff Size: Normal)   Pulse 65   Ht 5' 4.5 (1.638 m)   Wt 157 lb 3.2 oz (71.3 kg)   SpO2 96%   BMI 26.57 kg/m     Wt Readings from Last 3 Encounters:  02/01/24 157 lb 3.2 oz (71.3 kg)  06/05/23 156 lb (70.8 kg)  12/04/22 154 lb (69.9 kg)     GEN:  Well nourished, well developed in no acute distress HEENT: Normal NECK: No JVD; No carotid bruits LYMPHATICS: No lymphadenopathy CARDIAC: RRR, 2/6 systolic murmur at the right upper sternal border RESPIRATORY:  Clear to auscultation without rales, wheezing or rhonchi  ABDOMEN: Soft, non-tender, non-distended MUSCULOSKELETAL:  No edema; No deformity  SKIN: Warm and dry NEUROLOGIC:  Alert and oriented x 3 PSYCHIATRIC:  Normal affect   Assessment & Plan Primary hypertension Continue metoprolol , hydrochlorothiazide, and olmesartan.  Discussed regular exercise with a goal towards walking greater than 30 minutes daily at a moderate level/brisk pace. S/P aortic  valve replacement with bioprosthetic valve: Dr. Lucas - December 06, 2019 Normal function of her aortic bioprosthesis.  Recommend clinical follow-up in 1 year.  Continue SBE prophylaxis when indicated. Mixed hyperlipidemia Treated with rosuvastatin  and evolocumab .  LDL cholesterol is 20. Elevated lipoprotein(a) Treated with PCSK9 inhibitor.  Noted to have angiographically normal coronary arteries at preoperative cardiac catheterization in 2021.            Medication Adjustments/Labs and Tests Ordered: Current medicines are reviewed at length with the patient today.  Concerns regarding medicines are outlined above.  No orders of the defined types were placed in this encounter.  No orders of the defined types were placed in this encounter.   Patient Instructions  Medication Instructions:  Your physician recommends that you continue on your current medications as directed. Please refer to the Current Medication list given to you today.  *If you need a refill on your cardiac medications before your next appointment, please call your pharmacy*  Follow-Up: At [facility], you and your health needs are our priority.  As part of our continuing mission to provide you with exceptional heart care, our providers are all part of one team.  This team includes your primary Cardiologist (physician) and Advanced Practice Providers or APPs (Physician Assistants and Nurse Practitioners) who all work together to provide you with the care you need, when you need it.  Your next appointment:   1 year  Provider:  Dr. Wonda       Signed, Ozell Wonda, MD  02/01/2024 12:04 PM    [facility]

## 2024-02-01 NOTE — Assessment & Plan Note (Signed)
 Normal function of her aortic bioprosthesis.  Recommend clinical follow-up in 1 year.  Continue SBE prophylaxis when indicated.

## 2024-02-02 ENCOUNTER — Other Ambulatory Visit: Payer: Self-pay | Admitting: General Practice
